# Patient Record
Sex: Female | Born: 1945
Health system: Southern US, Community
[De-identification: ages and names within clinical notes are randomized; demographics above are authoritative.]

## PROBLEM LIST (undated history)

## (undated) DIAGNOSIS — E669 Obesity, unspecified: Secondary | ICD-10-CM

## (undated) DIAGNOSIS — F32A Depression, unspecified: Secondary | ICD-10-CM

## (undated) DIAGNOSIS — R0609 Other forms of dyspnea: Principal | ICD-10-CM

## (undated) DIAGNOSIS — G20 Parkinson's disease: Secondary | ICD-10-CM

## (undated) DIAGNOSIS — G20C Parkinsonism, unspecified: Secondary | ICD-10-CM

## (undated) DIAGNOSIS — L509 Urticaria, unspecified: Secondary | ICD-10-CM

## (undated) DIAGNOSIS — F319 Bipolar disorder, unspecified: Secondary | ICD-10-CM

## (undated) DIAGNOSIS — F419 Anxiety disorder, unspecified: Secondary | ICD-10-CM

## (undated) DIAGNOSIS — F329 Major depressive disorder, single episode, unspecified: Secondary | ICD-10-CM

## (undated) DIAGNOSIS — E039 Hypothyroidism, unspecified: Secondary | ICD-10-CM

## (undated) HISTORY — DX: Parkinsonism, unspecified: G20.C

## (undated) HISTORY — DX: Anxiety disorder, unspecified: F41.9

## (undated) HISTORY — PX: ABDOMINAL HYSTERECTOMY: SHX81

## (undated) HISTORY — DX: Other forms of dyspnea: R06.09

## (undated) HISTORY — PX: KNEE SURGERY: SHX244

## (undated) HISTORY — DX: Urticaria, unspecified: L50.9

## (undated) HISTORY — DX: Parkinson's disease: G20

## (undated) HISTORY — PX: HUMERUS FRACTURE SURGERY: SHX670

## (undated) HISTORY — DX: Bipolar disorder, unspecified: F31.9

## (undated) HISTORY — DX: Hypothyroidism, unspecified: E03.9

## (undated) HISTORY — DX: Major depressive disorder, single episode, unspecified: F32.9

## (undated) HISTORY — PX: BREAST ENHANCEMENT SURGERY: SHX7

## (undated) HISTORY — PX: FOOT FRACTURE SURGERY: SHX645

## (undated) HISTORY — DX: Obesity, unspecified: E66.9

## (undated) HISTORY — DX: Depression, unspecified: F32.A

---

## 2003-08-14 HISTORY — PX: COLONOSCOPY: SHX174

## 2004-02-18 ENCOUNTER — Ambulatory Visit (HOSPITAL_COMMUNITY): Admission: RE | Admit: 2004-02-18 | Discharge: 2004-02-18 | Payer: Self-pay | Admitting: Family Medicine

## 2004-02-18 ENCOUNTER — Ambulatory Visit (HOSPITAL_COMMUNITY): Admission: RE | Admit: 2004-02-18 | Discharge: 2004-02-18 | Payer: Self-pay | Admitting: Internal Medicine

## 2004-02-23 ENCOUNTER — Encounter: Payer: Self-pay | Admitting: Internal Medicine

## 2004-02-23 ENCOUNTER — Ambulatory Visit (HOSPITAL_COMMUNITY): Admission: RE | Admit: 2004-02-23 | Discharge: 2004-02-23 | Payer: Self-pay | Admitting: Internal Medicine

## 2004-02-23 DIAGNOSIS — R197 Diarrhea, unspecified: Secondary | ICD-10-CM

## 2006-02-12 ENCOUNTER — Ambulatory Visit: Payer: Self-pay | Admitting: Cardiology

## 2009-06-15 ENCOUNTER — Ambulatory Visit: Payer: Self-pay | Admitting: Cardiology

## 2010-02-10 DIAGNOSIS — R0600 Dyspnea, unspecified: Secondary | ICD-10-CM

## 2010-02-10 DIAGNOSIS — R0609 Other forms of dyspnea: Secondary | ICD-10-CM

## 2010-02-10 DIAGNOSIS — R0602 Shortness of breath: Secondary | ICD-10-CM | POA: Insufficient documentation

## 2010-02-10 HISTORY — DX: Dyspnea, unspecified: R06.00

## 2010-02-10 HISTORY — DX: Other forms of dyspnea: R06.09

## 2010-04-15 ENCOUNTER — Encounter: Payer: Self-pay | Admitting: Cardiovascular Disease

## 2010-05-29 ENCOUNTER — Ambulatory Visit: Payer: Self-pay | Admitting: Gastroenterology

## 2010-05-29 ENCOUNTER — Encounter: Payer: Self-pay | Admitting: Internal Medicine

## 2010-05-29 DIAGNOSIS — R197 Diarrhea, unspecified: Secondary | ICD-10-CM | POA: Insufficient documentation

## 2010-05-30 ENCOUNTER — Encounter: Payer: Self-pay | Admitting: Internal Medicine

## 2010-05-30 ENCOUNTER — Encounter: Payer: Self-pay | Admitting: Gastroenterology

## 2010-06-01 LAB — CONVERTED CEMR LAB
Basophils Relative: 0 % (ref 0–1)
Eosinophils Absolute: 0.2 10*3/uL (ref 0.0–0.7)
Eosinophils Relative: 4 % (ref 0–5)
HCT: 36.2 % (ref 36.0–46.0)
MCV: 92.3 fL (ref 78.0–100.0)
Monocytes Absolute: 0.6 10*3/uL (ref 0.1–1.0)
Neutro Abs: 2.4 10*3/uL (ref 1.7–7.7)
Neutrophils Relative %: 40 % — ABNORMAL LOW (ref 43–77)
Platelets: 194 10*3/uL (ref 150–400)
RBC: 3.92 M/uL (ref 3.87–5.11)
RDW: 13.6 % (ref 11.5–15.5)

## 2010-06-09 ENCOUNTER — Telehealth (INDEPENDENT_AMBULATORY_CARE_PROVIDER_SITE_OTHER): Payer: Self-pay

## 2010-09-12 NOTE — Letter (Signed)
Summary: FLEX SIG ORDER  FLEX SIG ORDER   Imported By: Ave Filter 05/29/2010 12:07:54  _____________________________________________________________________  External Attachment:    Type:   Image     Comment:   External Document

## 2010-09-12 NOTE — Letter (Signed)
Summary: REFERRAL FROM DR Neita Carp  REFERRAL FROM DR Neita Carp   Imported By: Rexene Alberts 05/30/2010 10:14:38  _____________________________________________________________________  External Attachment:    Type:   Image     Comment:   External Document
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
## 2010-09-12 NOTE — Progress Notes (Signed)
  Phone Note Call from Patient Call back at Home Phone [phone]   Caller: Patient Summary of Call: pt called- she went to pick up entocort rx and it was [phone]. pt thinks her insurance will pay for it if she gets a 90 day supply even if she doesnt take all of it. pt requesting 90 day supply [facility]  Initial call taken by: Hendricks Limes LPN,  June 09, 2010 12:11 PM     Appended Document:  OK to call in Entocort generic 3mg , take three by mouth daily. #270, 0refills  Appended Document:  [facility]

## 2010-09-22 ENCOUNTER — Encounter: Payer: Self-pay | Admitting: Cardiovascular Disease

## 2010-09-25 ENCOUNTER — Encounter: Payer: Self-pay | Admitting: Cardiovascular Disease

## 2010-10-03 ENCOUNTER — Institutional Professional Consult (permissible substitution) (INDEPENDENT_AMBULATORY_CARE_PROVIDER_SITE_OTHER): Payer: PRIVATE HEALTH INSURANCE | Admitting: Cardiovascular Disease

## 2010-10-03 ENCOUNTER — Encounter: Payer: Self-pay | Admitting: Cardiovascular Disease

## 2010-10-03 DIAGNOSIS — R0602 Shortness of breath: Secondary | ICD-10-CM

## 2010-10-04 ENCOUNTER — Ambulatory Visit (HOSPITAL_COMMUNITY): Payer: PRIVATE HEALTH INSURANCE | Attending: Cardiovascular Disease

## 2010-10-04 DIAGNOSIS — I079 Rheumatic tricuspid valve disease, unspecified: Secondary | ICD-10-CM | POA: Insufficient documentation

## 2010-10-04 DIAGNOSIS — I059 Rheumatic mitral valve disease, unspecified: Secondary | ICD-10-CM | POA: Insufficient documentation

## 2010-10-04 DIAGNOSIS — R0609 Other forms of dyspnea: Secondary | ICD-10-CM | POA: Insufficient documentation

## 2010-10-04 DIAGNOSIS — M7989 Other specified soft tissue disorders: Secondary | ICD-10-CM | POA: Insufficient documentation

## 2010-10-04 DIAGNOSIS — R0989 Other specified symptoms and signs involving the circulatory and respiratory systems: Secondary | ICD-10-CM | POA: Insufficient documentation
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
## 2010-10-12 ENCOUNTER — Ambulatory Visit (INDEPENDENT_AMBULATORY_CARE_PROVIDER_SITE_OTHER): Payer: PRIVATE HEALTH INSURANCE | Admitting: Cardiovascular Disease

## 2010-10-12 ENCOUNTER — Encounter: Payer: Self-pay | Admitting: Cardiovascular Disease

## 2010-10-12 DIAGNOSIS — R0602 Shortness of breath: Secondary | ICD-10-CM

## 2010-10-18 ENCOUNTER — Encounter: Payer: Self-pay | Admitting: Internal Medicine

## 2010-10-18 ENCOUNTER — Institutional Professional Consult (permissible substitution) (INDEPENDENT_AMBULATORY_CARE_PROVIDER_SITE_OTHER): Payer: PRIVATE HEALTH INSURANCE | Admitting: Internal Medicine

## 2010-10-18 DIAGNOSIS — R0602 Shortness of breath: Secondary | ICD-10-CM

## 2010-10-19 NOTE — Letter (Signed)
Summary: Psychiatric Progress Note  Psychiatric Progress Note   Imported By: Marylou Mccoy 10/12/2010 14:23:35  _____________________________________________________________________  External Attachment:    Type:   Image     Comment:   External Document

## 2010-10-19 NOTE — Letter (Signed)
Summary: Psychiatric Progress Note  Psychiatric Progress Note   Imported By: Marylou Mccoy 10/12/2010 14:22:18  _____________________________________________________________________  External Attachment:    Type:   Image     Comment:   External Document

## 2010-10-19 NOTE — Assessment & Plan Note (Signed)
Summary: rov/wpa   Visit Type:  follow-up / echo Referring Provider:  Fara Chute Primary Provider:  Fara Chute  CC:  leg pain.  History of Present Illness: 65 yo WF with  history of hypothyroidism, bipolar disorder who is here today for follow up. I saw her one week ago for evaluation of dyspnea. She tells me that she has had dyspnea with exertion for the last 6 months. No cough, fever, chills. She was admitted to [facility] 2 years ago with SOB and edema and was diuresed. She denied any chest pain.  She had a normal cardiac cath 20 years ago in [place]. Normal stress test in December 2010 at [facility] in [place]. At the first visit I ordered an echo. This showed normal LV size and function with EF of 60-65%. There was moderate LVH. No significant valvular issues.   She tells me that she continues to have dyspnea with minimal exertion. She has no chest pain. Her legs are weak when she first stands. She has no known vascular disease. She has had prior left knee surgery.   Current Medications (verified): 1)  Dicyclomine Hcl 10 Mg Caps (Dicyclomine Hcl) .... Q 6 Hours As Needed 2)  Levothroid 150 Mcg Tabs (Levothyroxine Sodium) .... Once Daily 3)  Divalproex Sodium 500 Mg Tbec (Divalproex Sodium) .... 4 At Bedtime 4)  Lorazepam 0.5 Mg Tabs (Lorazepam) .... Take 1 Tablet By Mouth Once A Day 5)  Sertraline Hcl 50 Mg Tabs (Sertraline Hcl) .... Once Daily 6)  Tylenol Pm Extra Strength 500-25 Mg Tabs (Diphenhydramine-Apap (Sleep)) .... At Bedtime 7)  Budesonide 3 Mg Xr24h-Cap (Budesonide) .... 3 By Mouth Every Morning  Allergies (verified): No Known Drug Allergies  Past History:  Past Medical History: Reviewed history from 10/03/2010 and no changes required. Hypothyroidism Bipolar D/O  TCS, Dr. Gabriel Cirri 12/10, unremarkable. No biopsies done. TCS/TI, Dr. Jena Gauss, 2005. Endoscopically normal appearing colon and TI. Awaiting old records of biopsies.  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 No drug use.  Vital Signs:  Patient profile:   65 year old female Height:      66 inches Weight:      229 pounds BMI:     37.10 Pulse rate:   86 / minute BP sitting:   120 / 74  (left arm) Cuff size:   large  Vitals Entered By: Caralee Ates CMA (October 12, 2010 12:04 PM)   Impression & Recommendations:  Problem # 1:  DYSPNEA (ICD-786.05) I cannot explain her dyspnea based on my findings. She has LVH but no diastolic dysfunction. There are no significant valvular abnormalities. Her LV function is normal. She has had a normal cardiac cath remotely with normal stress myoview one year ago in [place]. There is no chest pain. BP is well controlled. Her dyspnea is likely multifactorial including obesity and deconditioning however she has had no prior pulmonary workup. She is a life-long non-smoker. I will refer her to see Dr. Marchelle Gearing at [facility] for pulmonary workup.  I will see her back in one year and repeat an echo at that time to assess her LV size and wall thickness.   Orders: Pulmonary Referral (Pulmonary)  Patient Instructions: 1)  Your physician recommends that you schedule a follow-up appointment in: 1 year 2)  Your physician recommends that you continue on your current medications as directed. Please refer to the Current Medication list given to  you today. 3)  Your physician has requested that you have an echocardiogram in 1 year.  Echocardiography is a painless test that uses sound waves to create images of your heart. It provides your doctor with information about the size and shape of your heart and how well your heart's chambers and valves are working.  This procedure takes approximately one hour. There are no restrictions for this procedure. 4)  You have been referred to see Dr. Marchelle Gearing with [facility].

## 2010-10-24 NOTE — Assessment & Plan Note (Signed)
Summary: dyspnea/dr mcalhany/mhh   Copy to:  Dr Earney Hamburg Primary Provider/Referring Provider:  Fara Chute  CC:  Pulmonary Consult for SOB with exertion. Marland Kitchen  History of Present Illness: 65 yo WF non-smoker, no passive smoking. with  history of hypothyroidism, obesity (BMI 36),  bipolar disorder. Own Presenter, broadcasting business and also works same. She has had dyspnea with exertion for the last 6 months. Insidious onset. Progressive. Currently present with minimal exertion. Activities like walking small inclines make her dyspneic or walking  1 flight of stairs.  Relieved by rest.  No associuated cough, fever, chills, ssputum, edema, orthopnea, paroxysmal nocturnal dyspnea, wheezing  .She was admitted to [facility] 2 years ago with SOB and edema and was diuresed. She denies any chest pain.  She had a normal cardiac cath 20 years ago in [place]. Normal stress test in December 2010 at [facility] in [place]. Echo 2012 with Dr. Clifton James showed normal LV size and function with EF of 60-65%. There was moderate LVH. No significant valvular issues.   Cards could not explain her dyspnea due to lack of diastolic dysfunction,,valvular abnormalities and normal LV function is normal and normal remote  cardiac cath remotely with normal stress myoview one year ago in [place]. So, referred here  Of note weight is 227#, 5 years ago was 200#.  Weighed <150#, 20 years ago. She did not desaturate when we walked her 155 feet x 3 laps. She also significant OA of knees and limps. She also needs help to get out of chair or when stooping and feels this is due to muscle weakness if lower limbs. She feels this weakness is tied to dyspnea. In terms of imaging, CT abd lung cut in 2005 is normal on review. She reports cxr 8 months ago at PMD office - firs one she states "verge of pna" and followup one was "normal"  Preventive Screening-Counseling & Management  Alcohol-Tobacco     Smoking Status:  never     Passive Smoke Exposure: no  Current Medications (verified): 1)  Dicyclomine Hcl 10 Mg Caps (Dicyclomine Hcl) .... Q 6 Hours As Needed 2)  Levothroid 150 Mcg Tabs (Levothyroxine Sodium) .... Once Daily 3)  Divalproex Sodium 500 Mg Tbec (Divalproex Sodium) .... 4 At Bedtime 4)  Lorazepam 0.5 Mg Tabs (Lorazepam) .... Take 1 Tablet By Mouth Once A Day 5)  Sertraline Hcl 50 Mg Tabs (Sertraline Hcl) .... Once Daily 6)  Tylenol Pm Extra Strength 500-25 Mg Tabs (Diphenhydramine-Apap (Sleep)) .... At Bedtime 7)  Budesonide 3 Mg Xr24h-Cap (Budesonide) .... 3 By Mouth Every Morning  Allergies (verified): No Known Drug Allergies  Social History: Smoking Status:  never Passive Smoke Exposure:  no  Review of Systems       The patient complains of shortness of breath with activity, anxiety, depression, and joint stiffness or pain.  The patient denies shortness of breath at rest, productive cough, non-productive cough, coughing up blood, chest pain, irregular heartbeats, acid heartburn, indigestion, loss of appetite, weight change, abdominal pain, difficulty swallowing, sore throat, tooth/dental problems, headaches, nasal congestion/difficulty breathing through nose, sneezing, itching, ear ache, hand/feet swelling, rash, change in color of mucus, and fever.    Vital Signs:  Patient profile:   65 year old female Height:      66 inches Weight:      226.25 pounds BMI:     36.65 O2 Sat:      99 % on Room air Temp:     98.1 degrees  F oral Pulse rate:   77 / minute BP sitting:   122 / 74  (right arm) Cuff size:   large  Vitals Entered By: Carron Curie CMA (October 18, 2010 10:49 AM)  O2 Flow:  Room air  Serial Vital Signs/Assessments:  Comments: Ambulatory Pulse Oximetry  Resting; HR__66___    02 Sat___98__  Lap1 (185 feet)   HR__97___   02 Sat__96___ Lap2 (185 feet)   HR___102__   02 Sat__96___    Lap3 (185 feet)   HR___103__   02 Sat__96___  _x__Test Completed without  Difficulty ___Test Stopped due to:   By: Carron Curie CMA   CC: Pulmonary Consult for SOB with exertion.  Comments Medications reviewed with patient Carron Curie CMA  October 18, 2010 10:51 AM Daytime phone number verified with patient.    Physical Exam  General:  well developed, well nourished, in no acute distress Head:  normocephalic and atraumatic Eyes:  PERRLA/EOM intact; conjunctiva and sclera clear Ears:  TMs intact and clear with normal canals Nose:  no deformity, discharge, inflammation, or lesions Mouth:  no deformity or lesions Neck:  no masses, thyromegaly, or abnormal cervical nodes Chest Wall:  no deformities noted Lungs:  clear bilaterally to auscultation and percussion Heart:  regular rate and rhythm, S1, S2 without murmurs, rubs, gallops, or clicks Abdomen:  bowel sounds positive; abdomen soft and non-tender without masses, or organomegaly Msk:  no deformity or scoliosis noted with normal posture Pulses:  pulses normal Extremities:  no clubbing, cyanosis, edema, or deformity noted Neurologic:  CN II-XII grossly intact with normal reflexes, coordination, muscle strength and tone Skin:  intact without lesions or rashes Cervical Nodes:  no significant adenopathy Axillary Nodes:  no significant adenopathy Psych:  alert and cooperative; normal mood and affect; normal attention span and concentration   CT Abdomen/Pelvis  Procedure date:  02/18/2004  Findings:      lung cut looks normal to my independent review  Impression & Recommendations:  Problem # 1:  SHORTNESS OF BREATH (SOB) (ICD-786.05) Assessment New Unclear cause of dyspnea. Cardiac etioogy ruled out. Possibly related to obesity or deconditioning. THe knee issue and Lower Extremity weakness is puzzling. She does not desaturate with exertion 155 feet x 3 laps. I will get full PFT. I will like to review cxr from outside. If these are normal, will consider CPST (she says she can try to do it  despite limitations on knee). Might even consider neuro referal for LE weakness Orders: Consultation Level V (16109)  Other Orders: Pulmonary Referral (Pulmonary)  Patient Instructions: 1)  please have full breathing test called PFT 2)  please bring your chest xray original hard copy films from Dr. Dian Situ office at time of your next followup 3)  return to see me after PFT

## 2010-11-07 ENCOUNTER — Encounter: Payer: Self-pay | Admitting: Internal Medicine

## 2010-11-08 ENCOUNTER — Ambulatory Visit (INDEPENDENT_AMBULATORY_CARE_PROVIDER_SITE_OTHER): Payer: PRIVATE HEALTH INSURANCE | Admitting: Internal Medicine

## 2010-11-08 ENCOUNTER — Encounter: Payer: Self-pay | Admitting: Internal Medicine

## 2010-11-08 VITALS — BP 130/70 | HR 101 | Temp 98.3°F | Ht 67.0 in | Wt 226.0 lb

## 2010-11-08 DIAGNOSIS — R0602 Shortness of breath: Secondary | ICD-10-CM

## 2010-11-08 DIAGNOSIS — E669 Obesity, unspecified: Secondary | ICD-10-CM | POA: Insufficient documentation

## 2010-11-08 DIAGNOSIS — R0609 Other forms of dyspnea: Secondary | ICD-10-CM

## 2010-11-08 DIAGNOSIS — M6281 Muscle weakness (generalized): Secondary | ICD-10-CM

## 2010-11-08 LAB — PULMONARY FUNCTION TEST

## 2010-11-08 NOTE — Assessment & Plan Note (Signed)
Normal stress test dec 2010. Reportedl normal CXR 2011. CT abdomen lung cut 2005  - normal. Normal PFT - march 2012  PLAN CPST bike test If this is negative, will get metahcoline challenge for asthma

## 2010-11-08 NOTE — Assessment & Plan Note (Signed)
Unclear if this is due to DJD and obesity or neuromuscular problem. I cannot detect any muscle weakness on exam. We discussed neuro referral but she wants to hold off till cpst complete

## 2010-11-08 NOTE — Progress Notes (Signed)
PFT done today. 

## 2010-11-08 NOTE — Progress Notes (Signed)
  Subjective:    Patient ID: Annette Johnston, female    DOB: September 07, 1945, 65 y.o.   MRN: [id number]  Shortness of Breath This is a chronic problem. Episode onset: since july 2011. The problem occurs constantly. The problem has been gradually worsening. Duration: with exertion. Pertinent negatives include no abdominal pain, chest pain, claudication, coryza, ear pain, fever, headaches, hemoptysis, leg pain, leg swelling, neck pain, orthopnea, PND, rash, rhinorrhea, sore throat, sputum production, swollen glands, syncope, vomiting or wheezing. The symptoms are aggravated by exercise. Associated symptoms comments: Proximal muscles feel weak and reports issues like husband having to lift her out of chair or unable to climb onto tractor. Risk factors: obesity. She has tried nothing for the symptoms. The treatment provided no relief. There is no history of allergies, aspirin allergies, asthma, bronchiolitis, CAD, chronic lung disease, COPD, DVT, a heart failure, PE, pneumonia or a recent surgery. (Obesity, djd and proximal muscle weakness)  Today is followup with PFTs - PFTs are normal. She could not bring her outside cxr due to issues with computer system at [facility] office. She promises she wil bring it in next time    Review of Systems  Constitutional: Negative for fever.  HENT: Negative for ear pain, sore throat, rhinorrhea and neck pain.   Respiratory: Positive for shortness of breath. Negative for hemoptysis, sputum production and wheezing.   Cardiovascular: Negative for chest pain, orthopnea, claudication, leg swelling, syncope and PND.  Gastrointestinal: Negative for vomiting and abdominal pain.  Musculoskeletal: Positive for gait problem.       [C/o proximal muscle weakness Skin: Negative for rash.  Neurological: Negative for headaches.       Objective:   Physical Exam  [vitalsreviewed. Constitutional: She is oriented to person, place, and time. She appears well-developed and well-nourished. No  distress.       obese  HENT:  Head: Normocephalic and atraumatic.  Right Ear: External ear normal.  Left Ear: External ear normal.  Mouth/Throat: Oropharynx is clear and moist. No oropharyngeal exudate.  Eyes: Conjunctivae and EOM are normal. Pupils are equal, round, and reactive to light. Right eye exhibits no discharge. Left eye exhibits no discharge. No scleral icterus.  Neck: Normal range of motion. Neck supple. No JVD present. No tracheal deviation present. No thyromegaly present.  Cardiovascular: Normal rate, regular rhythm, normal heart sounds and intact distal pulses.  Exam reveals no gallop and no friction rub.   No murmur heard. Pulmonary/Chest: Effort normal and breath sounds normal. No respiratory distress. She has no wheezes. She has no rales. She exhibits no tenderness.  Abdominal: Soft. Bowel sounds are normal. She exhibits no distension and no mass. There is no tenderness. There is no rebound and no guarding.  Musculoskeletal: Normal range of motion. She exhibits no edema and no tenderness.  Lymphadenopathy:    She has no cervical adenopathy.  Neurological: She is alert and oriented to person, place, and time. She has normal reflexes. No cranial nerve deficit. She exhibits normal muscle tone. Coordination normal.       i cannot determine any proximal muscle weakness  Skin: Skin is warm and dry. No rash noted. She is not diaphoretic. No erythema. No pallor.  Psychiatric: She has a normal mood and affect. Her behavior is normal. Judgment and thought content normal.          Assessment & Plan:

## 2010-11-08 NOTE — Patient Instructions (Signed)
Please have bike CPST test Return to see me after that test

## 2010-11-15 ENCOUNTER — Ambulatory Visit (HOSPITAL_COMMUNITY): Payer: PRIVATE HEALTH INSURANCE | Attending: Internal Medicine

## 2010-11-15 ENCOUNTER — Encounter: Payer: Self-pay | Admitting: Internal Medicine

## 2010-11-15 DIAGNOSIS — R0989 Other specified symptoms and signs involving the circulatory and respiratory systems: Secondary | ICD-10-CM | POA: Insufficient documentation

## 2010-11-15 DIAGNOSIS — R0609 Other forms of dyspnea: Secondary | ICD-10-CM | POA: Insufficient documentation

## 2010-11-23 ENCOUNTER — Telehealth: Payer: Self-pay | Admitting: Internal Medicine

## 2010-11-23 NOTE — Telephone Encounter (Signed)
Spoke w/ pt and she had a CPST test done 11/08/10. Pt states she has not heard anything back about theses results. Pt was told to follow up after she had CPST. Pt is scheduled to see MR 12/15/10 AT 11:30. Pt aware MR is out of the office. Please advise MR. Thanks

## 2010-11-27 NOTE — Telephone Encounter (Signed)
Please let her know that i just got back today and will call back in 1-2 days

## 2010-11-27 NOTE — Telephone Encounter (Signed)
LMOMTCB to inform pt MR will call her back in 1-2 days regarding results.

## 2010-11-29 NOTE — Telephone Encounter (Signed)
Please let her know it will be early next week for me to get CPST results

## 2010-11-29 NOTE — Telephone Encounter (Signed)
Called, spoke with pt.  She is aware CPST results will be ready early next week and was understanding of this.  Will forward message back to MR as a reminder.

## 2010-12-05 ENCOUNTER — Telehealth: Payer: Self-pay | Admitting: Internal Medicine

## 2010-12-05 DIAGNOSIS — M6281 Muscle weakness (generalized): Secondary | ICD-10-CM

## 2010-12-05 NOTE — Telephone Encounter (Signed)
Please call patient and say CPST suggests breathing difficulty due to weight and/or muscle weakness. Please make referreal to neuro Dr. Dolores Lory (neurmuscular) or Dr Anne Hahn (need to find out if he specializes in neuromuscular). Please give appt to see me; I will discuss and order ssome tests for proximal muscle weakness (Dermatomyositis etc)

## 2010-12-12 NOTE — Telephone Encounter (Signed)
Pt set to see MR on 12-27-10 and order placed for neuro referral. Pt aware. Carron Curie, CMA

## 2010-12-15 ENCOUNTER — Ambulatory Visit: Payer: No Typology Code available for payment source | Admitting: Internal Medicine

## 2010-12-19 NOTE — Telephone Encounter (Signed)
See phone note 12/05/10

## 2010-12-19 NOTE — Telephone Encounter (Signed)
I thought I sent Annette Johnston  A message through epic. Either i messed up how I sent it or JC has not looked at it. Please clarify ?

## 2010-12-22 ENCOUNTER — Encounter: Payer: Self-pay | Admitting: Internal Medicine

## 2010-12-27 ENCOUNTER — Ambulatory Visit (INDEPENDENT_AMBULATORY_CARE_PROVIDER_SITE_OTHER): Payer: No Typology Code available for payment source | Admitting: Internal Medicine

## 2010-12-27 ENCOUNTER — Encounter: Payer: Self-pay | Admitting: Internal Medicine

## 2010-12-27 ENCOUNTER — Other Ambulatory Visit (INDEPENDENT_AMBULATORY_CARE_PROVIDER_SITE_OTHER): Payer: No Typology Code available for payment source

## 2010-12-27 VITALS — BP 120/78 | HR 99 | Temp 98.3°F | Ht 67.0 in | Wt 226.8 lb

## 2010-12-27 DIAGNOSIS — G709 Myoneural disorder, unspecified: Secondary | ICD-10-CM

## 2010-12-27 DIAGNOSIS — M6281 Muscle weakness (generalized): Secondary | ICD-10-CM

## 2010-12-27 DIAGNOSIS — R0989 Other specified symptoms and signs involving the circulatory and respiratory systems: Secondary | ICD-10-CM

## 2010-12-27 LAB — C-REACTIVE PROTEIN: CRP: 0.4 mg/dL (ref <0.6)

## 2010-12-27 LAB — SEDIMENTATION RATE: Sed Rate: 26 mm/hr — ABNORMAL HIGH (ref 0–22)

## 2010-12-27 LAB — CK TOTAL AND CKMB (NOT AT ARMC): CK, MB: 1.6 ng/mL (ref 0.3–4.0)

## 2010-12-27 NOTE — Progress Notes (Signed)
Subjective:    Patient ID: Annette Johnston, female    DOB: 1945-09-01, 65 y.o.   MRN: [id number]  HPI This is a chronic problem. Episode onset: since july 2011. The problem occurs constantly. The problem has been gradually worsening. Duration: with exertion. Pertinent negatives include no abdominal pain, chest pain, claudication, coryza, ear pain, fever, headaches, hemoptysis, leg pain, leg swelling, neck pain, orthopnea, PND, rash, rhinorrhea, sore throat, sputum production, swollen glands, syncope, vomiting or wheezing. The symptoms are aggravated by exercise. Associated symptoms comments: Proximal muscles feel weak and reports issues like husband having to lift her out of chair or unable to climb onto tractor. Risk factors: obesity. She has tried nothing for the symptoms. The treatment provided no relief. There is no history of allergies, aspirin allergies, asthma, bronchiolitis, CAD, chronic lung disease, COPD, DVT, a heart failure, PE, pneumonia or a recent surgery. (Obesity, djd and proximal muscle weakness)   OV 11/08/2010: Today is followup with PFTs - PFTs are normal. She could not bring her outside cxr due to issues with computer system at [facility] office. She promises she wil bring it in next time .  REC: CPST  OV May 2012: Followup dyspnea after CPST. No change in symptoms. Still complaining of difficulty rising; she think it is due to knees but unsure. Had CPST 11/15/2010: RER 1.01 somewhat submaximal effort. Vo2 max is low at 20.4/75.6%, AT normal 51%, Peark RR 67 and reached ventilatory limits. MVV low. Ve/VCO2 slope elevated suggesting dead space ventilation. Questin neuromuscular problems. Questioned her about thyroid - states she used to have graves disease and was operated on for proptosis by Dr. Demetrius Charity at [facility]. Per report recent TSH, FT4 normal     Review of Systems  Constitutional: Negative for fever and unexpected weight change.  HENT: Negative for ear pain, nosebleeds, congestion, sore  throat, rhinorrhea, sneezing, trouble swallowing, dental problem, postnasal drip and sinus pressure.   Eyes: Negative for redness and itching.  Respiratory: Negative for cough, chest tightness, shortness of breath and wheezing.   Cardiovascular: Negative for palpitations and leg swelling.  Gastrointestinal: Negative for nausea and vomiting.  Genitourinary: Negative for dysuria.  Musculoskeletal: Negative for joint swelling.  Skin: Negative for rash.  Neurological: Negative for headaches.  Hematological: Does not bruise/bleed easily.  Psychiatric/Behavioral: Negative for dysphoric mood. The patient is not nervous/anxious.        Objective:   Physical Exam Objective:   Physical Exam  [vitalsreviewed. Constitutional: She is oriented to person, place, and time. She appears well-developed and well-nourished. No distress.       obese  HENT:  Head: Normocephalic and atraumatic.  Right Ear: External ear normal.  Left Ear: External ear normal.  Mouth/Throat: Oropharynx is clear and moist. No oropharyngeal exudate.  Eyes: Conjunctivae and EOM are normal. Pupils are equal, round, and reactive to light. Right eye exhibits no discharge. Left eye exhibits no discharge. No scleral icterus.  Neck: Normal range of motion. Neck supple. No JVD present. No tracheal deviation present. No thyromegaly present.  Cardiovascular: Normal rate, regular rhythm, normal heart sounds and intact distal pulses.  Exam reveals no gallop and no friction rub.   No murmur heard. Pulmonary/Chest: Effort normal and breath sounds normal. No respiratory distress. She has no wheezes. She has no rales. She exhibits no tenderness.  Abdominal: Soft. Bowel sounds are normal. She exhibits no distension and no mass. There is no tenderness. There is no rebound and no guarding.  Musculoskeletal: Normal range of motion.  She exhibits no edema and no tenderness.  Lymphadenopathy:    She has no cervical adenopathy.  Neurological: She is  alert and oriented to person, place, and time. She has normal reflexes. No cranial nerve deficit. She exhibits normal muscle tone. Coordination normal.       i cannot determine any proximal muscle weakness  Skin: Skin is warm and dry. No rash noted. She is not diaphoretic. No erythema. No pallor.  Psychiatric: She has a normal mood and affect. Her behavior is normal. Judgment and thought content norm        Assessment & Plan:

## 2010-12-27 NOTE — Patient Instructions (Signed)
Please have blood test that I have already ordered Please see neurologist - we will send them the note Please return to see me in 1 month after you see neurologist
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
## 2010-12-29 NOTE — Op Note (Signed)
NAME:  Annette Johnston, Annette Johnston                          ACCOUNT NO.:  [id number]   MEDICAL RECORD NO.:  [id number]                   PATIENT TYPE:  AMB   LOCATION:  DAY                                  FACILITY:  [facility]   PHYSICIAN:  R. Roetta Sessions, M.D.              DATE OF BIRTH:  [birth year]   DATE OF PROCEDURE:  02/23/2004  DATE OF DISCHARGE:                                  PROCEDURE NOTE   PROCEDURE:  Colonoscopy with ileoscopy, segmental biopsy, stool sampling.   ENDOSCOPIST:  Jonathon Bellows, M.D.   INDICATION FOR PROCEDURE:  The patient is a 65 year old lady who has had  basically diarrhea since October 2004.  Stool studies through our office  came back negative.  She was shown to have some mild hepatomegaly.  CT  demonstrated 2 very small cystic lesions in the liver that were felt not to  be clinically significant and a couple of small left iliac lymph nodes.  CBC, MET-7 and LFTs came back normal as well.  Colonoscopy is now being  done.  This approach has been discussed with the patient at length and  potential risks, benefits and alternatives have been reviewed and questions  answered; she is agreeable.   PROCEDURE NOTE:  O2 saturation, blood pressure, pulse and respirations were  monitored throughout the entirety of the procedure.   CONSCIOUS SEDATION:  Versed 3 mg IV, Demerol 75 mg IV in divided doses.   INSTRUMENT:  Olympus video chip system.   FINDINGS:  Digital rectal examination revealed no abnormalities.   ENDOSCOPIC FINDINGS:  The prep was good.   RECTUM:  Examination of the rectal mucosa including a retroflexed view of  the anal verge revealed no abnormalities.   COLON:  Colonic mucosa was surveyed from the rectosigmoid junction through  the left, transverse and right colon to the area of the appendiceal orifice,  ileocecal valve and cecum.  These structures were well-seen and photographed  for the record.  From this level, the scope was slowly withdrawn and all  previously mentioned mucosal surfaces were again seen.  The colonic mucosa  in the terminal ileum appeared normal.  Biopsies of the right and sigmoid  colon were taken for histologic study, also a stool sample was taken.   The patient tolerated the procedure well and was reacted at endoscopy.   IMPRESSION:  Normal rectum and colon to terminal ileum, status post  segmental biopsies and stool collection.   RECOMMENDATIONS:  Follow up on repeat stool studies and biopsies, further  recommendations to follow.      ___________________________________________                                            Jonathon Bellows, M.D.   RMR/MEDQ  D:  02/23/2004  T:  02/23/2004  Job:  [id number]   cc:   Fara Chute  [address] [place]  [place] [address]  Fax: [phone]

## 2011-01-06 ENCOUNTER — Encounter: Payer: Self-pay | Admitting: Internal Medicine

## 2011-01-06 NOTE — Assessment & Plan Note (Signed)
Unclear if this is due to DJD and obesity or neuromuscular problem. I cannot detect any muscle weakness on exam. Will check autoimmune profle and refer to neuro for opinion

## 2011-01-19 ENCOUNTER — Telehealth: Payer: Self-pay | Admitting: Internal Medicine

## 2011-01-19 NOTE — Telephone Encounter (Signed)
[id number] to advise pt of lab results. Carron Curie, CMA

## 2011-01-19 NOTE — Telephone Encounter (Signed)
Pt returning call.Annette Johnston ° °

## 2011-01-19 NOTE — Telephone Encounter (Signed)
Spoke with pt and notified of results per MR. Pt verbalized understanding.

## 2011-01-19 NOTE — Telephone Encounter (Signed)
LMOMTCB need to inform pt per MR that autoimmune results normal so far.

## 2011-02-19 ENCOUNTER — Other Ambulatory Visit: Payer: Self-pay | Admitting: Neurology

## 2011-02-19 DIAGNOSIS — R269 Unspecified abnormalities of gait and mobility: Secondary | ICD-10-CM

## 2011-02-19 DIAGNOSIS — R5383 Other fatigue: Secondary | ICD-10-CM

## 2011-02-21 ENCOUNTER — Ambulatory Visit
Admission: RE | Admit: 2011-02-21 | Discharge: 2011-02-21 | Disposition: A | Discharge status: Home / Self Care | Payer: No Typology Code available for payment source | Source: Ambulatory Visit | Attending: Neurology | Admitting: Neurology

## 2011-02-21 DIAGNOSIS — R269 Unspecified abnormalities of gait and mobility: Secondary | ICD-10-CM

## 2011-02-21 DIAGNOSIS — R5381 Other malaise: Secondary | ICD-10-CM

## 2011-08-24 DIAGNOSIS — F3173 Bipolar disorder, in partial remission, most recent episode manic: Secondary | ICD-10-CM | POA: Diagnosis not present

## 2011-09-06 DIAGNOSIS — H02539 Eyelid retraction unspecified eye, unspecified lid: Secondary | ICD-10-CM | POA: Diagnosis not present

## 2011-09-06 DIAGNOSIS — E0500 Thyrotoxicosis with diffuse goiter without thyrotoxic crisis or storm: Secondary | ICD-10-CM | POA: Diagnosis not present

## 2011-09-06 DIAGNOSIS — H16219 Exposure keratoconjunctivitis, unspecified eye: Secondary | ICD-10-CM | POA: Diagnosis not present

## 2011-10-15 DIAGNOSIS — L03211 Cellulitis of face: Secondary | ICD-10-CM | POA: Diagnosis not present

## 2011-10-18 DIAGNOSIS — L0201 Cutaneous abscess of face: Secondary | ICD-10-CM | POA: Diagnosis not present

## 2011-10-18 DIAGNOSIS — L03211 Cellulitis of face: Secondary | ICD-10-CM | POA: Diagnosis not present

## 2011-10-18 DIAGNOSIS — H10029 Other mucopurulent conjunctivitis, unspecified eye: Secondary | ICD-10-CM | POA: Diagnosis not present

## 2011-11-08 DIAGNOSIS — H81399 Other peripheral vertigo, unspecified ear: Secondary | ICD-10-CM | POA: Diagnosis not present

## 2011-12-05 DIAGNOSIS — S335XXA Sprain of ligaments of lumbar spine, initial encounter: Secondary | ICD-10-CM | POA: Diagnosis not present

## 2011-12-05 DIAGNOSIS — M999 Biomechanical lesion, unspecified: Secondary | ICD-10-CM | POA: Diagnosis not present

## 2011-12-05 DIAGNOSIS — M5430 Sciatica, unspecified side: Secondary | ICD-10-CM | POA: Diagnosis not present

## 2011-12-06 DIAGNOSIS — S335XXA Sprain of ligaments of lumbar spine, initial encounter: Secondary | ICD-10-CM | POA: Diagnosis not present

## 2011-12-06 DIAGNOSIS — M999 Biomechanical lesion, unspecified: Secondary | ICD-10-CM | POA: Diagnosis not present

## 2011-12-06 DIAGNOSIS — M5430 Sciatica, unspecified side: Secondary | ICD-10-CM | POA: Diagnosis not present

## 2011-12-10 DIAGNOSIS — M5430 Sciatica, unspecified side: Secondary | ICD-10-CM | POA: Diagnosis not present

## 2011-12-10 DIAGNOSIS — M999 Biomechanical lesion, unspecified: Secondary | ICD-10-CM | POA: Diagnosis not present

## 2011-12-10 DIAGNOSIS — S335XXA Sprain of ligaments of lumbar spine, initial encounter: Secondary | ICD-10-CM | POA: Diagnosis not present

## 2012-01-11 DIAGNOSIS — J209 Acute bronchitis, unspecified: Secondary | ICD-10-CM | POA: Diagnosis not present

## 2012-01-11 DIAGNOSIS — E039 Hypothyroidism, unspecified: Secondary | ICD-10-CM | POA: Diagnosis not present

## 2012-01-31 DIAGNOSIS — F3173 Bipolar disorder, in partial remission, most recent episode manic: Secondary | ICD-10-CM | POA: Diagnosis not present

## 2012-02-28 DIAGNOSIS — T678XXA Other effects of heat and light, initial encounter: Secondary | ICD-10-CM | POA: Diagnosis not present

## 2012-02-28 DIAGNOSIS — R0609 Other forms of dyspnea: Secondary | ICD-10-CM | POA: Diagnosis not present

## 2012-02-28 DIAGNOSIS — R9431 Abnormal electrocardiogram [ECG] [EKG]: Secondary | ICD-10-CM | POA: Diagnosis not present

## 2012-02-28 DIAGNOSIS — T675XXA Heat exhaustion, unspecified, initial encounter: Secondary | ICD-10-CM | POA: Diagnosis not present

## 2012-02-28 DIAGNOSIS — X30XXXA Exposure to excessive natural heat, initial encounter: Secondary | ICD-10-CM | POA: Diagnosis not present

## 2012-04-30 DIAGNOSIS — Z79899 Other long term (current) drug therapy: Secondary | ICD-10-CM | POA: Diagnosis not present

## 2012-04-30 DIAGNOSIS — F3112 Bipolar disorder, current episode manic without psychotic features, moderate: Secondary | ICD-10-CM | POA: Diagnosis not present

## 2012-05-06 DIAGNOSIS — E7800 Pure hypercholesterolemia, unspecified: Secondary | ICD-10-CM | POA: Diagnosis not present

## 2012-05-06 DIAGNOSIS — N951 Menopausal and female climacteric states: Secondary | ICD-10-CM | POA: Diagnosis not present

## 2012-05-06 DIAGNOSIS — E559 Vitamin D deficiency, unspecified: Secondary | ICD-10-CM | POA: Diagnosis not present

## 2012-05-06 DIAGNOSIS — E0590 Thyrotoxicosis, unspecified without thyrotoxic crisis or storm: Secondary | ICD-10-CM | POA: Diagnosis not present

## 2012-05-06 DIAGNOSIS — E2749 Other adrenocortical insufficiency: Secondary | ICD-10-CM | POA: Diagnosis not present

## 2012-05-06 DIAGNOSIS — F3173 Bipolar disorder, in partial remission, most recent episode manic: Secondary | ICD-10-CM | POA: Diagnosis not present

## 2012-05-06 DIAGNOSIS — E509 Vitamin A deficiency, unspecified: Secondary | ICD-10-CM | POA: Diagnosis not present

## 2012-05-06 DIAGNOSIS — F319 Bipolar disorder, unspecified: Secondary | ICD-10-CM | POA: Diagnosis not present

## 2012-05-06 DIAGNOSIS — D518 Other vitamin B12 deficiency anemias: Secondary | ICD-10-CM | POA: Diagnosis not present

## 2012-05-22 DIAGNOSIS — J3089 Other allergic rhinitis: Secondary | ICD-10-CM | POA: Diagnosis not present

## 2012-05-22 DIAGNOSIS — H93299 Other abnormal auditory perceptions, unspecified ear: Secondary | ICD-10-CM | POA: Diagnosis not present

## 2012-05-22 DIAGNOSIS — T169XXA Foreign body in ear, unspecified ear, initial encounter: Secondary | ICD-10-CM | POA: Diagnosis not present

## 2012-05-22 DIAGNOSIS — H6120 Impacted cerumen, unspecified ear: Secondary | ICD-10-CM | POA: Diagnosis not present

## 2012-07-01 DIAGNOSIS — F3173 Bipolar disorder, in partial remission, most recent episode manic: Secondary | ICD-10-CM | POA: Diagnosis not present

## 2012-08-01 DIAGNOSIS — F3173 Bipolar disorder, in partial remission, most recent episode manic: Secondary | ICD-10-CM | POA: Diagnosis not present

## 2012-08-12 DIAGNOSIS — J018 Other acute sinusitis: Secondary | ICD-10-CM | POA: Diagnosis not present

## 2012-08-12 DIAGNOSIS — N760 Acute vaginitis: Secondary | ICD-10-CM | POA: Diagnosis not present

## 2012-08-27 DIAGNOSIS — H903 Sensorineural hearing loss, bilateral: Secondary | ICD-10-CM | POA: Diagnosis not present

## 2012-08-27 DIAGNOSIS — J301 Allergic rhinitis due to pollen: Secondary | ICD-10-CM | POA: Diagnosis not present

## 2012-09-02 DIAGNOSIS — T8549XA Other mechanical complication of breast prosthesis and implant, initial encounter: Secondary | ICD-10-CM | POA: Diagnosis not present

## 2012-09-02 DIAGNOSIS — N63 Unspecified lump in unspecified breast: Secondary | ICD-10-CM | POA: Diagnosis not present

## 2012-09-02 DIAGNOSIS — N6459 Other signs and symptoms in breast: Secondary | ICD-10-CM | POA: Diagnosis not present

## 2012-09-02 DIAGNOSIS — R928 Other abnormal and inconclusive findings on diagnostic imaging of breast: Secondary | ICD-10-CM | POA: Diagnosis not present

## 2012-09-17 DIAGNOSIS — T8549XA Other mechanical complication of breast prosthesis and implant, initial encounter: Secondary | ICD-10-CM | POA: Diagnosis not present

## 2012-10-15 DIAGNOSIS — Z01818 Encounter for other preprocedural examination: Secondary | ICD-10-CM | POA: Diagnosis not present

## 2012-10-24 DIAGNOSIS — F3173 Bipolar disorder, in partial remission, most recent episode manic: Secondary | ICD-10-CM | POA: Diagnosis not present

## 2012-10-29 DIAGNOSIS — F3112 Bipolar disorder, current episode manic without psychotic features, moderate: Secondary | ICD-10-CM | POA: Diagnosis not present

## 2012-10-29 DIAGNOSIS — E039 Hypothyroidism, unspecified: Secondary | ICD-10-CM | POA: Diagnosis not present

## 2012-11-03 DIAGNOSIS — R9431 Abnormal electrocardiogram [ECG] [EKG]: Secondary | ICD-10-CM | POA: Diagnosis not present

## 2012-11-03 DIAGNOSIS — R110 Nausea: Secondary | ICD-10-CM | POA: Diagnosis not present

## 2012-12-01 DIAGNOSIS — K5900 Constipation, unspecified: Secondary | ICD-10-CM | POA: Diagnosis not present

## 2012-12-01 DIAGNOSIS — Z79899 Other long term (current) drug therapy: Secondary | ICD-10-CM | POA: Diagnosis not present

## 2012-12-01 DIAGNOSIS — IMO0002 Reserved for concepts with insufficient information to code with codable children: Secondary | ICD-10-CM | POA: Diagnosis not present

## 2012-12-01 DIAGNOSIS — R059 Cough, unspecified: Secondary | ICD-10-CM | POA: Diagnosis not present

## 2012-12-01 DIAGNOSIS — F319 Bipolar disorder, unspecified: Secondary | ICD-10-CM | POA: Diagnosis not present

## 2012-12-01 DIAGNOSIS — L723 Sebaceous cyst: Secondary | ICD-10-CM | POA: Diagnosis not present

## 2012-12-01 DIAGNOSIS — R339 Retention of urine, unspecified: Secondary | ICD-10-CM | POA: Diagnosis not present

## 2012-12-01 DIAGNOSIS — J9819 Other pulmonary collapse: Secondary | ICD-10-CM | POA: Diagnosis not present

## 2012-12-01 DIAGNOSIS — R062 Wheezing: Secondary | ICD-10-CM | POA: Diagnosis not present

## 2012-12-01 DIAGNOSIS — E039 Hypothyroidism, unspecified: Secondary | ICD-10-CM | POA: Diagnosis not present

## 2012-12-01 DIAGNOSIS — D649 Anemia, unspecified: Secondary | ICD-10-CM | POA: Diagnosis not present

## 2012-12-01 DIAGNOSIS — T85898A Other specified complication of other internal prosthetic devices, implants and grafts, initial encounter: Secondary | ICD-10-CM | POA: Diagnosis not present

## 2012-12-01 DIAGNOSIS — R05 Cough: Secondary | ICD-10-CM | POA: Diagnosis not present

## 2012-12-01 DIAGNOSIS — R279 Unspecified lack of coordination: Secondary | ICD-10-CM | POA: Diagnosis not present

## 2012-12-01 DIAGNOSIS — T8549XA Other mechanical complication of breast prosthesis and implant, initial encounter: Secondary | ICD-10-CM | POA: Diagnosis not present

## 2012-12-01 DIAGNOSIS — R82998 Other abnormal findings in urine: Secondary | ICD-10-CM | POA: Diagnosis not present

## 2012-12-01 DIAGNOSIS — R509 Fever, unspecified: Secondary | ICD-10-CM | POA: Diagnosis not present

## 2012-12-01 DIAGNOSIS — M6020 Foreign body granuloma of soft tissue, not elsewhere classified, unspecified site: Secondary | ICD-10-CM | POA: Diagnosis not present

## 2012-12-01 DIAGNOSIS — R079 Chest pain, unspecified: Secondary | ICD-10-CM | POA: Diagnosis not present

## 2012-12-01 DIAGNOSIS — D62 Acute posthemorrhagic anemia: Secondary | ICD-10-CM | POA: Diagnosis not present

## 2012-12-01 DIAGNOSIS — R319 Hematuria, unspecified: Secondary | ICD-10-CM | POA: Diagnosis not present

## 2012-12-01 DIAGNOSIS — Z466 Encounter for fitting and adjustment of urinary device: Secondary | ICD-10-CM | POA: Diagnosis not present

## 2012-12-03 DIAGNOSIS — R339 Retention of urine, unspecified: Secondary | ICD-10-CM | POA: Diagnosis not present

## 2012-12-03 DIAGNOSIS — D62 Acute posthemorrhagic anemia: Secondary | ICD-10-CM | POA: Diagnosis not present

## 2012-12-03 DIAGNOSIS — Z466 Encounter for fitting and adjustment of urinary device: Secondary | ICD-10-CM | POA: Diagnosis not present

## 2012-12-03 DIAGNOSIS — R509 Fever, unspecified: Secondary | ICD-10-CM | POA: Diagnosis not present

## 2012-12-03 DIAGNOSIS — K5900 Constipation, unspecified: Secondary | ICD-10-CM | POA: Diagnosis not present

## 2012-12-03 DIAGNOSIS — IMO0002 Reserved for concepts with insufficient information to code with codable children: Secondary | ICD-10-CM | POA: Diagnosis not present

## 2012-12-03 DIAGNOSIS — R062 Wheezing: Secondary | ICD-10-CM | POA: Diagnosis not present

## 2012-12-03 DIAGNOSIS — R059 Cough, unspecified: Secondary | ICD-10-CM | POA: Diagnosis not present

## 2012-12-03 DIAGNOSIS — R05 Cough: Secondary | ICD-10-CM | POA: Diagnosis not present

## 2012-12-03 DIAGNOSIS — D649 Anemia, unspecified: Secondary | ICD-10-CM | POA: Diagnosis not present

## 2012-12-04 DIAGNOSIS — IMO0002 Reserved for concepts with insufficient information to code with codable children: Secondary | ICD-10-CM | POA: Diagnosis not present

## 2012-12-16 DIAGNOSIS — F3173 Bipolar disorder, in partial remission, most recent episode manic: Secondary | ICD-10-CM | POA: Diagnosis not present

## 2012-12-18 DIAGNOSIS — Z4889 Encounter for other specified surgical aftercare: Secondary | ICD-10-CM | POA: Diagnosis not present

## 2012-12-18 DIAGNOSIS — Z4802 Encounter for removal of sutures: Secondary | ICD-10-CM | POA: Diagnosis not present

## 2013-01-26 DIAGNOSIS — S92919A Unspecified fracture of unspecified toe(s), initial encounter for closed fracture: Secondary | ICD-10-CM | POA: Diagnosis not present

## 2013-01-27 DIAGNOSIS — Z79899 Other long term (current) drug therapy: Secondary | ICD-10-CM | POA: Diagnosis not present

## 2013-01-27 DIAGNOSIS — D500 Iron deficiency anemia secondary to blood loss (chronic): Secondary | ICD-10-CM | POA: Diagnosis not present

## 2013-01-27 DIAGNOSIS — F3173 Bipolar disorder, in partial remission, most recent episode manic: Secondary | ICD-10-CM | POA: Diagnosis not present

## 2013-01-27 DIAGNOSIS — F3132 Bipolar disorder, current episode depressed, moderate: Secondary | ICD-10-CM | POA: Diagnosis not present

## 2013-02-26 DIAGNOSIS — J039 Acute tonsillitis, unspecified: Secondary | ICD-10-CM | POA: Diagnosis not present

## 2013-02-26 DIAGNOSIS — H9209 Otalgia, unspecified ear: Secondary | ICD-10-CM | POA: Diagnosis not present

## 2013-03-06 DIAGNOSIS — F3173 Bipolar disorder, in partial remission, most recent episode manic: Secondary | ICD-10-CM | POA: Diagnosis not present

## 2013-03-13 DIAGNOSIS — J40 Bronchitis, not specified as acute or chronic: Secondary | ICD-10-CM | POA: Diagnosis not present

## 2013-03-20 DIAGNOSIS — J209 Acute bronchitis, unspecified: Secondary | ICD-10-CM | POA: Diagnosis not present

## 2013-03-27 DIAGNOSIS — F329 Major depressive disorder, single episode, unspecified: Secondary | ICD-10-CM | POA: Diagnosis not present

## 2013-03-27 DIAGNOSIS — R269 Unspecified abnormalities of gait and mobility: Secondary | ICD-10-CM | POA: Diagnosis not present

## 2013-03-27 DIAGNOSIS — G253 Myoclonus: Secondary | ICD-10-CM | POA: Diagnosis not present

## 2013-03-27 DIAGNOSIS — R259 Unspecified abnormal involuntary movements: Secondary | ICD-10-CM | POA: Diagnosis not present

## 2013-03-30 DIAGNOSIS — R279 Unspecified lack of coordination: Secondary | ICD-10-CM | POA: Diagnosis not present

## 2013-03-30 DIAGNOSIS — R259 Unspecified abnormal involuntary movements: Secondary | ICD-10-CM | POA: Diagnosis not present

## 2013-04-06 ENCOUNTER — Emergency Department (HOSPITAL_COMMUNITY)
Admission: EM | Admit: 2013-04-06 | Discharge: 2013-04-06 | Disposition: A | Discharge status: Home / Self Care | Payer: Medicare Other | Attending: Emergency Medicine | Admitting: Emergency Medicine

## 2013-04-06 ENCOUNTER — Emergency Department (HOSPITAL_COMMUNITY): Payer: Medicare Other

## 2013-04-06 ENCOUNTER — Encounter (HOSPITAL_COMMUNITY): Payer: Self-pay

## 2013-04-06 DIAGNOSIS — S46909A Unspecified injury of unspecified muscle, fascia and tendon at shoulder and upper arm level, unspecified arm, initial encounter: Secondary | ICD-10-CM | POA: Insufficient documentation

## 2013-04-06 DIAGNOSIS — S4980XA Other specified injuries of shoulder and upper arm, unspecified arm, initial encounter: Secondary | ICD-10-CM | POA: Diagnosis not present

## 2013-04-06 DIAGNOSIS — Z79899 Other long term (current) drug therapy: Secondary | ICD-10-CM | POA: Insufficient documentation

## 2013-04-06 DIAGNOSIS — Z23 Encounter for immunization: Secondary | ICD-10-CM | POA: Diagnosis not present

## 2013-04-06 DIAGNOSIS — F319 Bipolar disorder, unspecified: Secondary | ICD-10-CM | POA: Diagnosis not present

## 2013-04-06 DIAGNOSIS — S79919A Unspecified injury of unspecified hip, initial encounter: Secondary | ICD-10-CM | POA: Diagnosis not present

## 2013-04-06 DIAGNOSIS — E669 Obesity, unspecified: Secondary | ICD-10-CM | POA: Insufficient documentation

## 2013-04-06 DIAGNOSIS — M25519 Pain in unspecified shoulder: Secondary | ICD-10-CM | POA: Diagnosis not present

## 2013-04-06 DIAGNOSIS — E039 Hypothyroidism, unspecified: Secondary | ICD-10-CM | POA: Insufficient documentation

## 2013-04-06 DIAGNOSIS — W19XXXA Unspecified fall, initial encounter: Secondary | ICD-10-CM

## 2013-04-06 DIAGNOSIS — S0003XA Contusion of scalp, initial encounter: Secondary | ICD-10-CM | POA: Diagnosis not present

## 2013-04-06 DIAGNOSIS — S0083XA Contusion of other part of head, initial encounter: Secondary | ICD-10-CM

## 2013-04-06 DIAGNOSIS — Y929 Unspecified place or not applicable: Secondary | ICD-10-CM | POA: Insufficient documentation

## 2013-04-06 DIAGNOSIS — S0180XA Unspecified open wound of other part of head, initial encounter: Secondary | ICD-10-CM | POA: Insufficient documentation

## 2013-04-06 DIAGNOSIS — W010XXA Fall on same level from slipping, tripping and stumbling without subsequent striking against object, initial encounter: Secondary | ICD-10-CM | POA: Insufficient documentation

## 2013-04-06 DIAGNOSIS — Y9301 Activity, walking, marching and hiking: Secondary | ICD-10-CM | POA: Insufficient documentation

## 2013-04-06 DIAGNOSIS — M25559 Pain in unspecified hip: Secondary | ICD-10-CM | POA: Diagnosis not present

## 2013-04-06 MED ORDER — KETOROLAC TROMETHAMINE 60 MG/2ML IM SOLN
60.0000 mg | Freq: Once | INTRAMUSCULAR | Status: AC
  Administered 2013-04-06: 60 mg via INTRAMUSCULAR
  Filled 2013-04-06: qty 2

## 2013-04-06 MED ORDER — TETANUS-DIPHTH-ACELL PERTUSSIS 5-2.5-18.5 LF-MCG/0.5 IM SUSP
0.5000 mL | Freq: Once | INTRAMUSCULAR | Status: AC
  Administered 2013-04-06: 0.5 mL via INTRAMUSCULAR
  Filled 2013-04-06: qty 0.5

## 2013-04-06 NOTE — ED Notes (Signed)
Pt states she was on her way into the hospital and slipped on an uneven area of the sidewalk out front. States she fell forward on her face striking her chin and her glasses fell off. Also, complain of pain in right hip and right shoulder. Pt was place on backboard and c collar and transported to the ED
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
## 2013-04-06 NOTE — ED Notes (Signed)
Pt given discharge instructions and verbalized understanding. Vital signs were WNL. NAD.

## 2013-04-06 NOTE — ED Notes (Signed)
Cleaned 1 cm laceration on patients chin and applied a steri strip.

## 2013-04-06 NOTE — ED Notes (Signed)
Pt returned from x-ray, C-collar removed prior to x-ray by EDP.

## 2013-04-10 ENCOUNTER — Encounter (HOSPITAL_COMMUNITY): Payer: Self-pay | Admitting: *Deleted

## 2013-04-10 ENCOUNTER — Emergency Department (HOSPITAL_COMMUNITY)
Admission: EM | Admit: 2013-04-10 | Discharge: 2013-04-10 | Disposition: A | Discharge status: Home / Self Care | Payer: PRIVATE HEALTH INSURANCE | Attending: Emergency Medicine | Admitting: Emergency Medicine

## 2013-04-10 ENCOUNTER — Emergency Department (HOSPITAL_COMMUNITY): Payer: PRIVATE HEALTH INSURANCE

## 2013-04-10 DIAGNOSIS — M545 Low back pain, unspecified: Secondary | ICD-10-CM | POA: Insufficient documentation

## 2013-04-10 DIAGNOSIS — E669 Obesity, unspecified: Secondary | ICD-10-CM | POA: Insufficient documentation

## 2013-04-10 DIAGNOSIS — Z87828 Personal history of other (healed) physical injury and trauma: Secondary | ICD-10-CM | POA: Insufficient documentation

## 2013-04-10 DIAGNOSIS — M533 Sacrococcygeal disorders, not elsewhere classified: Secondary | ICD-10-CM | POA: Diagnosis not present

## 2013-04-10 DIAGNOSIS — F319 Bipolar disorder, unspecified: Secondary | ICD-10-CM | POA: Insufficient documentation

## 2013-04-10 DIAGNOSIS — E039 Hypothyroidism, unspecified: Secondary | ICD-10-CM | POA: Insufficient documentation

## 2013-04-10 DIAGNOSIS — Z79899 Other long term (current) drug therapy: Secondary | ICD-10-CM | POA: Insufficient documentation

## 2013-04-10 MED ORDER — HYDROCODONE-ACETAMINOPHEN 5-325 MG PO TABS: 1.0000 | ORAL_TABLET | ORAL | Status: DC | PRN

## 2013-04-10 NOTE — ED Notes (Signed)
Pt fell face forward on sidewalk here on Monday. States continued pain to the chin. States main reason she is here is due to lower back pain.
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
## 2013-04-13 NOTE — ED Provider Notes (Signed)
Medical screening examination/treatment/procedure(s) were performed by non-physician practitioner and as supervising physician I was immediately available for consultation/collaboration.  Lowella Kindley T Lakrisha Iseman, MD 04/13/13 2101 

## 2013-05-08 DIAGNOSIS — R269 Unspecified abnormalities of gait and mobility: Secondary | ICD-10-CM | POA: Diagnosis not present

## 2013-05-08 DIAGNOSIS — R259 Unspecified abnormal involuntary movements: Secondary | ICD-10-CM | POA: Diagnosis not present

## 2013-05-08 DIAGNOSIS — G253 Myoclonus: Secondary | ICD-10-CM | POA: Diagnosis not present

## 2013-05-25 DIAGNOSIS — F3173 Bipolar disorder, in partial remission, most recent episode manic: Secondary | ICD-10-CM | POA: Diagnosis not present

## 2013-05-29 DIAGNOSIS — M47817 Spondylosis without myelopathy or radiculopathy, lumbosacral region: Secondary | ICD-10-CM | POA: Diagnosis not present

## 2013-05-29 DIAGNOSIS — S335XXA Sprain of ligaments of lumbar spine, initial encounter: Secondary | ICD-10-CM | POA: Diagnosis not present

## 2013-05-29 DIAGNOSIS — M999 Biomechanical lesion, unspecified: Secondary | ICD-10-CM | POA: Diagnosis not present

## 2013-06-03 DIAGNOSIS — S335XXA Sprain of ligaments of lumbar spine, initial encounter: Secondary | ICD-10-CM | POA: Diagnosis not present

## 2013-06-03 DIAGNOSIS — M47817 Spondylosis without myelopathy or radiculopathy, lumbosacral region: Secondary | ICD-10-CM | POA: Diagnosis not present

## 2013-06-03 DIAGNOSIS — M999 Biomechanical lesion, unspecified: Secondary | ICD-10-CM | POA: Diagnosis not present

## 2013-06-05 DIAGNOSIS — S335XXA Sprain of ligaments of lumbar spine, initial encounter: Secondary | ICD-10-CM | POA: Diagnosis not present

## 2013-06-05 DIAGNOSIS — M999 Biomechanical lesion, unspecified: Secondary | ICD-10-CM | POA: Diagnosis not present

## 2013-06-05 DIAGNOSIS — M47817 Spondylosis without myelopathy or radiculopathy, lumbosacral region: Secondary | ICD-10-CM | POA: Diagnosis not present

## 2013-06-10 DIAGNOSIS — F3173 Bipolar disorder, in partial remission, most recent episode manic: Secondary | ICD-10-CM | POA: Diagnosis not present

## 2013-06-12 DIAGNOSIS — R262 Difficulty in walking, not elsewhere classified: Secondary | ICD-10-CM | POA: Diagnosis not present

## 2013-06-18 ENCOUNTER — Other Ambulatory Visit: Payer: Self-pay

## 2013-06-18 DIAGNOSIS — R05 Cough: Secondary | ICD-10-CM | POA: Diagnosis not present

## 2013-06-18 DIAGNOSIS — E039 Hypothyroidism, unspecified: Secondary | ICD-10-CM | POA: Diagnosis not present

## 2013-06-18 DIAGNOSIS — R21 Rash and other nonspecific skin eruption: Secondary | ICD-10-CM | POA: Diagnosis not present

## 2013-06-18 DIAGNOSIS — R059 Cough, unspecified: Secondary | ICD-10-CM | POA: Diagnosis not present

## 2013-07-07 DIAGNOSIS — R262 Difficulty in walking, not elsewhere classified: Secondary | ICD-10-CM | POA: Diagnosis not present

## 2013-07-13 DIAGNOSIS — R262 Difficulty in walking, not elsewhere classified: Secondary | ICD-10-CM | POA: Diagnosis not present

## 2013-07-17 DIAGNOSIS — R262 Difficulty in walking, not elsewhere classified: Secondary | ICD-10-CM | POA: Diagnosis not present

## 2013-07-22 DIAGNOSIS — R262 Difficulty in walking, not elsewhere classified: Secondary | ICD-10-CM | POA: Diagnosis not present

## 2013-07-28 DIAGNOSIS — R262 Difficulty in walking, not elsewhere classified: Secondary | ICD-10-CM | POA: Diagnosis not present

## 2013-07-28 DIAGNOSIS — E039 Hypothyroidism, unspecified: Secondary | ICD-10-CM | POA: Diagnosis not present

## 2013-07-30 DIAGNOSIS — F3173 Bipolar disorder, in partial remission, most recent episode manic: Secondary | ICD-10-CM | POA: Diagnosis not present

## 2013-08-04 DIAGNOSIS — H9209 Otalgia, unspecified ear: Secondary | ICD-10-CM | POA: Diagnosis not present

## 2013-08-04 DIAGNOSIS — J020 Streptococcal pharyngitis: Secondary | ICD-10-CM | POA: Diagnosis not present

## 2013-09-04 DIAGNOSIS — F3173 Bipolar disorder, in partial remission, most recent episode manic: Secondary | ICD-10-CM | POA: Diagnosis not present

## 2013-10-19 DIAGNOSIS — R05 Cough: Secondary | ICD-10-CM | POA: Diagnosis not present

## 2013-10-19 DIAGNOSIS — R059 Cough, unspecified: Secondary | ICD-10-CM | POA: Diagnosis not present

## 2013-10-19 DIAGNOSIS — J209 Acute bronchitis, unspecified: Secondary | ICD-10-CM | POA: Diagnosis not present

## 2013-10-31 DIAGNOSIS — J209 Acute bronchitis, unspecified: Secondary | ICD-10-CM | POA: Diagnosis not present

## 2013-10-31 DIAGNOSIS — R062 Wheezing: Secondary | ICD-10-CM | POA: Diagnosis not present

## 2013-11-14 DIAGNOSIS — I10 Essential (primary) hypertension: Secondary | ICD-10-CM | POA: Diagnosis not present

## 2013-11-14 DIAGNOSIS — S42213A Unspecified displaced fracture of surgical neck of unspecified humerus, initial encounter for closed fracture: Secondary | ICD-10-CM | POA: Diagnosis not present

## 2013-11-16 DIAGNOSIS — S42209A Unspecified fracture of upper end of unspecified humerus, initial encounter for closed fracture: Secondary | ICD-10-CM | POA: Diagnosis not present

## 2013-11-16 DIAGNOSIS — S42309A Unspecified fracture of shaft of humerus, unspecified arm, initial encounter for closed fracture: Secondary | ICD-10-CM | POA: Diagnosis not present

## 2013-11-19 DIAGNOSIS — R059 Cough, unspecified: Secondary | ICD-10-CM | POA: Diagnosis not present

## 2013-11-19 DIAGNOSIS — R05 Cough: Secondary | ICD-10-CM | POA: Diagnosis not present

## 2013-11-19 DIAGNOSIS — J209 Acute bronchitis, unspecified: Secondary | ICD-10-CM | POA: Diagnosis not present

## 2013-11-23 DIAGNOSIS — S42209A Unspecified fracture of upper end of unspecified humerus, initial encounter for closed fracture: Secondary | ICD-10-CM | POA: Diagnosis not present

## 2013-11-27 DIAGNOSIS — F319 Bipolar disorder, unspecified: Secondary | ICD-10-CM | POA: Diagnosis not present

## 2013-11-27 DIAGNOSIS — T1490XA Injury, unspecified, initial encounter: Secondary | ICD-10-CM | POA: Diagnosis not present

## 2013-11-27 DIAGNOSIS — S42309A Unspecified fracture of shaft of humerus, unspecified arm, initial encounter for closed fracture: Secondary | ICD-10-CM | POA: Diagnosis not present

## 2013-11-27 DIAGNOSIS — S42209A Unspecified fracture of upper end of unspecified humerus, initial encounter for closed fracture: Secondary | ICD-10-CM | POA: Diagnosis not present

## 2013-11-27 DIAGNOSIS — Z79899 Other long term (current) drug therapy: Secondary | ICD-10-CM | POA: Diagnosis not present

## 2013-11-27 DIAGNOSIS — W19XXXA Unspecified fall, initial encounter: Secondary | ICD-10-CM | POA: Diagnosis not present

## 2013-11-27 DIAGNOSIS — Y92009 Unspecified place in unspecified non-institutional (private) residence as the place of occurrence of the external cause: Secondary | ICD-10-CM | POA: Diagnosis not present

## 2013-11-27 DIAGNOSIS — S42213A Unspecified displaced fracture of surgical neck of unspecified humerus, initial encounter for closed fracture: Secondary | ICD-10-CM | POA: Diagnosis not present

## 2013-12-07 DIAGNOSIS — S42209A Unspecified fracture of upper end of unspecified humerus, initial encounter for closed fracture: Secondary | ICD-10-CM | POA: Diagnosis not present

## 2013-12-08 DIAGNOSIS — E039 Hypothyroidism, unspecified: Secondary | ICD-10-CM | POA: Diagnosis not present

## 2013-12-08 DIAGNOSIS — L570 Actinic keratosis: Secondary | ICD-10-CM | POA: Diagnosis not present

## 2013-12-11 DIAGNOSIS — H524 Presbyopia: Secondary | ICD-10-CM | POA: Diagnosis not present

## 2013-12-11 DIAGNOSIS — H2510 Age-related nuclear cataract, unspecified eye: Secondary | ICD-10-CM | POA: Diagnosis not present

## 2013-12-11 DIAGNOSIS — H43399 Other vitreous opacities, unspecified eye: Secondary | ICD-10-CM | POA: Diagnosis not present

## 2013-12-11 DIAGNOSIS — H52229 Regular astigmatism, unspecified eye: Secondary | ICD-10-CM | POA: Diagnosis not present

## 2013-12-14 DIAGNOSIS — M6281 Muscle weakness (generalized): Secondary | ICD-10-CM | POA: Diagnosis not present

## 2013-12-14 DIAGNOSIS — IMO0001 Reserved for inherently not codable concepts without codable children: Secondary | ICD-10-CM | POA: Diagnosis not present

## 2013-12-14 DIAGNOSIS — M25519 Pain in unspecified shoulder: Secondary | ICD-10-CM | POA: Diagnosis not present

## 2013-12-15 DIAGNOSIS — M25519 Pain in unspecified shoulder: Secondary | ICD-10-CM | POA: Diagnosis not present

## 2013-12-15 DIAGNOSIS — IMO0001 Reserved for inherently not codable concepts without codable children: Secondary | ICD-10-CM | POA: Diagnosis not present

## 2013-12-15 DIAGNOSIS — M6281 Muscle weakness (generalized): Secondary | ICD-10-CM | POA: Diagnosis not present

## 2013-12-17 DIAGNOSIS — M25519 Pain in unspecified shoulder: Secondary | ICD-10-CM | POA: Diagnosis not present

## 2013-12-17 DIAGNOSIS — IMO0001 Reserved for inherently not codable concepts without codable children: Secondary | ICD-10-CM | POA: Diagnosis not present

## 2013-12-17 DIAGNOSIS — M6281 Muscle weakness (generalized): Secondary | ICD-10-CM | POA: Diagnosis not present

## 2013-12-18 DIAGNOSIS — F3173 Bipolar disorder, in partial remission, most recent episode manic: Secondary | ICD-10-CM | POA: Diagnosis not present

## 2013-12-21 DIAGNOSIS — M25519 Pain in unspecified shoulder: Secondary | ICD-10-CM | POA: Diagnosis not present

## 2013-12-21 DIAGNOSIS — M6281 Muscle weakness (generalized): Secondary | ICD-10-CM | POA: Diagnosis not present

## 2013-12-21 DIAGNOSIS — IMO0001 Reserved for inherently not codable concepts without codable children: Secondary | ICD-10-CM | POA: Diagnosis not present

## 2013-12-22 DIAGNOSIS — IMO0001 Reserved for inherently not codable concepts without codable children: Secondary | ICD-10-CM | POA: Diagnosis not present

## 2013-12-22 DIAGNOSIS — M6281 Muscle weakness (generalized): Secondary | ICD-10-CM | POA: Diagnosis not present

## 2013-12-22 DIAGNOSIS — M25519 Pain in unspecified shoulder: Secondary | ICD-10-CM | POA: Diagnosis not present

## 2013-12-24 DIAGNOSIS — IMO0001 Reserved for inherently not codable concepts without codable children: Secondary | ICD-10-CM | POA: Diagnosis not present

## 2013-12-24 DIAGNOSIS — M25519 Pain in unspecified shoulder: Secondary | ICD-10-CM | POA: Diagnosis not present

## 2013-12-24 DIAGNOSIS — M6281 Muscle weakness (generalized): Secondary | ICD-10-CM | POA: Diagnosis not present

## 2013-12-28 DIAGNOSIS — IMO0001 Reserved for inherently not codable concepts without codable children: Secondary | ICD-10-CM | POA: Diagnosis not present

## 2013-12-28 DIAGNOSIS — M25519 Pain in unspecified shoulder: Secondary | ICD-10-CM | POA: Diagnosis not present

## 2013-12-28 DIAGNOSIS — M6281 Muscle weakness (generalized): Secondary | ICD-10-CM | POA: Diagnosis not present

## 2013-12-30 DIAGNOSIS — IMO0001 Reserved for inherently not codable concepts without codable children: Secondary | ICD-10-CM | POA: Diagnosis not present

## 2013-12-30 DIAGNOSIS — M25519 Pain in unspecified shoulder: Secondary | ICD-10-CM | POA: Diagnosis not present

## 2013-12-30 DIAGNOSIS — S42209A Unspecified fracture of upper end of unspecified humerus, initial encounter for closed fracture: Secondary | ICD-10-CM | POA: Diagnosis not present

## 2013-12-30 DIAGNOSIS — M6281 Muscle weakness (generalized): Secondary | ICD-10-CM | POA: Diagnosis not present

## 2013-12-31 DIAGNOSIS — M25519 Pain in unspecified shoulder: Secondary | ICD-10-CM | POA: Diagnosis not present

## 2013-12-31 DIAGNOSIS — M6281 Muscle weakness (generalized): Secondary | ICD-10-CM | POA: Diagnosis not present

## 2013-12-31 DIAGNOSIS — IMO0001 Reserved for inherently not codable concepts without codable children: Secondary | ICD-10-CM | POA: Diagnosis not present

## 2014-01-06 DIAGNOSIS — M25519 Pain in unspecified shoulder: Secondary | ICD-10-CM | POA: Diagnosis not present

## 2014-01-06 DIAGNOSIS — M6281 Muscle weakness (generalized): Secondary | ICD-10-CM | POA: Diagnosis not present

## 2014-01-06 DIAGNOSIS — IMO0001 Reserved for inherently not codable concepts without codable children: Secondary | ICD-10-CM | POA: Diagnosis not present

## 2014-01-07 DIAGNOSIS — M25519 Pain in unspecified shoulder: Secondary | ICD-10-CM | POA: Diagnosis not present

## 2014-01-07 DIAGNOSIS — M6281 Muscle weakness (generalized): Secondary | ICD-10-CM | POA: Diagnosis not present

## 2014-01-07 DIAGNOSIS — IMO0001 Reserved for inherently not codable concepts without codable children: Secondary | ICD-10-CM | POA: Diagnosis not present

## 2014-01-09 DIAGNOSIS — F3132 Bipolar disorder, current episode depressed, moderate: Secondary | ICD-10-CM | POA: Diagnosis not present

## 2014-01-09 DIAGNOSIS — R5381 Other malaise: Secondary | ICD-10-CM | POA: Diagnosis not present

## 2014-01-09 DIAGNOSIS — R5383 Other fatigue: Secondary | ICD-10-CM | POA: Diagnosis not present

## 2014-01-09 DIAGNOSIS — B372 Candidiasis of skin and nail: Secondary | ICD-10-CM | POA: Diagnosis not present

## 2014-01-11 DIAGNOSIS — M6281 Muscle weakness (generalized): Secondary | ICD-10-CM | POA: Diagnosis not present

## 2014-01-11 DIAGNOSIS — M25519 Pain in unspecified shoulder: Secondary | ICD-10-CM | POA: Diagnosis not present

## 2014-01-11 DIAGNOSIS — IMO0001 Reserved for inherently not codable concepts without codable children: Secondary | ICD-10-CM | POA: Diagnosis not present

## 2014-01-13 DIAGNOSIS — IMO0001 Reserved for inherently not codable concepts without codable children: Secondary | ICD-10-CM | POA: Diagnosis not present

## 2014-01-13 DIAGNOSIS — M25519 Pain in unspecified shoulder: Secondary | ICD-10-CM | POA: Diagnosis not present

## 2014-01-13 DIAGNOSIS — M6281 Muscle weakness (generalized): Secondary | ICD-10-CM | POA: Diagnosis not present

## 2014-01-14 DIAGNOSIS — M25519 Pain in unspecified shoulder: Secondary | ICD-10-CM | POA: Diagnosis not present

## 2014-01-14 DIAGNOSIS — M6281 Muscle weakness (generalized): Secondary | ICD-10-CM | POA: Diagnosis not present

## 2014-01-14 DIAGNOSIS — IMO0001 Reserved for inherently not codable concepts without codable children: Secondary | ICD-10-CM | POA: Diagnosis not present

## 2014-01-18 DIAGNOSIS — M6281 Muscle weakness (generalized): Secondary | ICD-10-CM | POA: Diagnosis not present

## 2014-01-18 DIAGNOSIS — IMO0001 Reserved for inherently not codable concepts without codable children: Secondary | ICD-10-CM | POA: Diagnosis not present

## 2014-01-18 DIAGNOSIS — M25519 Pain in unspecified shoulder: Secondary | ICD-10-CM | POA: Diagnosis not present

## 2014-01-20 DIAGNOSIS — F411 Generalized anxiety disorder: Secondary | ICD-10-CM | POA: Diagnosis not present

## 2014-01-20 DIAGNOSIS — R5381 Other malaise: Secondary | ICD-10-CM | POA: Diagnosis not present

## 2014-01-20 DIAGNOSIS — R0602 Shortness of breath: Secondary | ICD-10-CM | POA: Diagnosis not present

## 2014-01-20 DIAGNOSIS — R5383 Other fatigue: Secondary | ICD-10-CM | POA: Diagnosis not present

## 2014-01-21 DIAGNOSIS — IMO0001 Reserved for inherently not codable concepts without codable children: Secondary | ICD-10-CM | POA: Diagnosis not present

## 2014-01-21 DIAGNOSIS — M25519 Pain in unspecified shoulder: Secondary | ICD-10-CM | POA: Diagnosis not present

## 2014-01-21 DIAGNOSIS — M6281 Muscle weakness (generalized): Secondary | ICD-10-CM | POA: Diagnosis not present

## 2014-01-25 DIAGNOSIS — IMO0001 Reserved for inherently not codable concepts without codable children: Secondary | ICD-10-CM | POA: Diagnosis not present

## 2014-01-25 DIAGNOSIS — M6281 Muscle weakness (generalized): Secondary | ICD-10-CM | POA: Diagnosis not present

## 2014-01-25 DIAGNOSIS — M25519 Pain in unspecified shoulder: Secondary | ICD-10-CM | POA: Diagnosis not present

## 2014-01-26 DIAGNOSIS — R5383 Other fatigue: Secondary | ICD-10-CM | POA: Diagnosis not present

## 2014-01-26 DIAGNOSIS — R5381 Other malaise: Secondary | ICD-10-CM | POA: Diagnosis not present

## 2014-01-26 DIAGNOSIS — R0602 Shortness of breath: Secondary | ICD-10-CM | POA: Diagnosis not present

## 2014-01-26 DIAGNOSIS — R9431 Abnormal electrocardiogram [ECG] [EKG]: Secondary | ICD-10-CM | POA: Diagnosis not present

## 2014-01-27 DIAGNOSIS — M6281 Muscle weakness (generalized): Secondary | ICD-10-CM | POA: Diagnosis not present

## 2014-01-27 DIAGNOSIS — M25519 Pain in unspecified shoulder: Secondary | ICD-10-CM | POA: Diagnosis not present

## 2014-01-27 DIAGNOSIS — IMO0001 Reserved for inherently not codable concepts without codable children: Secondary | ICD-10-CM | POA: Diagnosis not present

## 2014-01-27 DIAGNOSIS — S42209A Unspecified fracture of upper end of unspecified humerus, initial encounter for closed fracture: Secondary | ICD-10-CM | POA: Diagnosis not present

## 2014-01-28 DIAGNOSIS — R0602 Shortness of breath: Secondary | ICD-10-CM | POA: Diagnosis not present

## 2014-02-01 DIAGNOSIS — M6281 Muscle weakness (generalized): Secondary | ICD-10-CM | POA: Diagnosis not present

## 2014-02-01 DIAGNOSIS — IMO0001 Reserved for inherently not codable concepts without codable children: Secondary | ICD-10-CM | POA: Diagnosis not present

## 2014-02-01 DIAGNOSIS — M25519 Pain in unspecified shoulder: Secondary | ICD-10-CM | POA: Diagnosis not present

## 2014-02-03 DIAGNOSIS — M6281 Muscle weakness (generalized): Secondary | ICD-10-CM | POA: Diagnosis not present

## 2014-02-03 DIAGNOSIS — M25519 Pain in unspecified shoulder: Secondary | ICD-10-CM | POA: Diagnosis not present

## 2014-02-03 DIAGNOSIS — IMO0001 Reserved for inherently not codable concepts without codable children: Secondary | ICD-10-CM | POA: Diagnosis not present

## 2014-02-04 DIAGNOSIS — R9431 Abnormal electrocardiogram [ECG] [EKG]: Secondary | ICD-10-CM | POA: Diagnosis not present

## 2014-02-04 DIAGNOSIS — IMO0001 Reserved for inherently not codable concepts without codable children: Secondary | ICD-10-CM | POA: Diagnosis not present

## 2014-02-04 DIAGNOSIS — M6281 Muscle weakness (generalized): Secondary | ICD-10-CM | POA: Diagnosis not present

## 2014-02-04 DIAGNOSIS — R0602 Shortness of breath: Secondary | ICD-10-CM | POA: Diagnosis not present

## 2014-02-04 DIAGNOSIS — M25519 Pain in unspecified shoulder: Secondary | ICD-10-CM | POA: Diagnosis not present

## 2014-02-08 DIAGNOSIS — S42209A Unspecified fracture of upper end of unspecified humerus, initial encounter for closed fracture: Secondary | ICD-10-CM | POA: Diagnosis not present

## 2014-02-08 DIAGNOSIS — M6281 Muscle weakness (generalized): Secondary | ICD-10-CM | POA: Diagnosis not present

## 2014-02-08 DIAGNOSIS — M1710 Unilateral primary osteoarthritis, unspecified knee: Secondary | ICD-10-CM | POA: Diagnosis not present

## 2014-02-08 DIAGNOSIS — IMO0002 Reserved for concepts with insufficient information to code with codable children: Secondary | ICD-10-CM | POA: Diagnosis not present

## 2014-02-08 DIAGNOSIS — M25519 Pain in unspecified shoulder: Secondary | ICD-10-CM | POA: Diagnosis not present

## 2014-02-08 DIAGNOSIS — IMO0001 Reserved for inherently not codable concepts without codable children: Secondary | ICD-10-CM | POA: Diagnosis not present

## 2014-02-08 DIAGNOSIS — S83006A Unspecified dislocation of unspecified patella, initial encounter: Secondary | ICD-10-CM | POA: Diagnosis not present

## 2014-02-09 DIAGNOSIS — R0602 Shortness of breath: Secondary | ICD-10-CM | POA: Diagnosis not present

## 2014-02-09 DIAGNOSIS — R0989 Other specified symptoms and signs involving the circulatory and respiratory systems: Secondary | ICD-10-CM | POA: Diagnosis not present

## 2014-02-09 DIAGNOSIS — R0609 Other forms of dyspnea: Secondary | ICD-10-CM | POA: Diagnosis not present

## 2014-02-09 DIAGNOSIS — R9431 Abnormal electrocardiogram [ECG] [EKG]: Secondary | ICD-10-CM | POA: Diagnosis not present

## 2014-02-10 DIAGNOSIS — M6281 Muscle weakness (generalized): Secondary | ICD-10-CM | POA: Diagnosis not present

## 2014-02-10 DIAGNOSIS — M25519 Pain in unspecified shoulder: Secondary | ICD-10-CM | POA: Diagnosis not present

## 2014-02-10 DIAGNOSIS — IMO0001 Reserved for inherently not codable concepts without codable children: Secondary | ICD-10-CM | POA: Diagnosis not present

## 2014-02-11 DIAGNOSIS — IMO0001 Reserved for inherently not codable concepts without codable children: Secondary | ICD-10-CM | POA: Diagnosis not present

## 2014-02-11 DIAGNOSIS — M25519 Pain in unspecified shoulder: Secondary | ICD-10-CM | POA: Diagnosis not present

## 2014-02-11 DIAGNOSIS — M6281 Muscle weakness (generalized): Secondary | ICD-10-CM | POA: Diagnosis not present

## 2014-02-15 DIAGNOSIS — M6281 Muscle weakness (generalized): Secondary | ICD-10-CM | POA: Diagnosis not present

## 2014-02-15 DIAGNOSIS — M25519 Pain in unspecified shoulder: Secondary | ICD-10-CM | POA: Diagnosis not present

## 2014-02-15 DIAGNOSIS — IMO0001 Reserved for inherently not codable concepts without codable children: Secondary | ICD-10-CM | POA: Diagnosis not present

## 2014-02-17 DIAGNOSIS — IMO0001 Reserved for inherently not codable concepts without codable children: Secondary | ICD-10-CM | POA: Diagnosis not present

## 2014-02-17 DIAGNOSIS — M6281 Muscle weakness (generalized): Secondary | ICD-10-CM | POA: Diagnosis not present

## 2014-02-17 DIAGNOSIS — M25519 Pain in unspecified shoulder: Secondary | ICD-10-CM | POA: Diagnosis not present

## 2014-02-22 DIAGNOSIS — M25519 Pain in unspecified shoulder: Secondary | ICD-10-CM | POA: Diagnosis not present

## 2014-02-22 DIAGNOSIS — M6281 Muscle weakness (generalized): Secondary | ICD-10-CM | POA: Diagnosis not present

## 2014-02-22 DIAGNOSIS — IMO0001 Reserved for inherently not codable concepts without codable children: Secondary | ICD-10-CM | POA: Diagnosis not present

## 2014-02-24 DIAGNOSIS — IMO0002 Reserved for concepts with insufficient information to code with codable children: Secondary | ICD-10-CM | POA: Diagnosis not present

## 2014-02-24 DIAGNOSIS — S42209A Unspecified fracture of upper end of unspecified humerus, initial encounter for closed fracture: Secondary | ICD-10-CM | POA: Diagnosis not present

## 2014-02-24 DIAGNOSIS — M1710 Unilateral primary osteoarthritis, unspecified knee: Secondary | ICD-10-CM | POA: Diagnosis not present

## 2014-03-01 DIAGNOSIS — M25519 Pain in unspecified shoulder: Secondary | ICD-10-CM | POA: Diagnosis not present

## 2014-03-01 DIAGNOSIS — IMO0001 Reserved for inherently not codable concepts without codable children: Secondary | ICD-10-CM | POA: Diagnosis not present

## 2014-03-01 DIAGNOSIS — M6281 Muscle weakness (generalized): Secondary | ICD-10-CM | POA: Diagnosis not present

## 2014-03-03 DIAGNOSIS — IMO0001 Reserved for inherently not codable concepts without codable children: Secondary | ICD-10-CM | POA: Diagnosis not present

## 2014-03-03 DIAGNOSIS — M6281 Muscle weakness (generalized): Secondary | ICD-10-CM | POA: Diagnosis not present

## 2014-03-03 DIAGNOSIS — M25519 Pain in unspecified shoulder: Secondary | ICD-10-CM | POA: Diagnosis not present

## 2014-03-08 DIAGNOSIS — M6281 Muscle weakness (generalized): Secondary | ICD-10-CM | POA: Diagnosis not present

## 2014-03-08 DIAGNOSIS — IMO0001 Reserved for inherently not codable concepts without codable children: Secondary | ICD-10-CM | POA: Diagnosis not present

## 2014-03-08 DIAGNOSIS — M25519 Pain in unspecified shoulder: Secondary | ICD-10-CM | POA: Diagnosis not present

## 2014-03-10 DIAGNOSIS — M6281 Muscle weakness (generalized): Secondary | ICD-10-CM | POA: Diagnosis not present

## 2014-03-10 DIAGNOSIS — IMO0001 Reserved for inherently not codable concepts without codable children: Secondary | ICD-10-CM | POA: Diagnosis not present

## 2014-03-10 DIAGNOSIS — M25519 Pain in unspecified shoulder: Secondary | ICD-10-CM | POA: Diagnosis not present

## 2014-03-15 DIAGNOSIS — M25519 Pain in unspecified shoulder: Secondary | ICD-10-CM | POA: Diagnosis not present

## 2014-03-15 DIAGNOSIS — IMO0001 Reserved for inherently not codable concepts without codable children: Secondary | ICD-10-CM | POA: Diagnosis not present

## 2014-03-15 DIAGNOSIS — M6281 Muscle weakness (generalized): Secondary | ICD-10-CM | POA: Diagnosis not present

## 2014-03-29 DIAGNOSIS — B372 Candidiasis of skin and nail: Secondary | ICD-10-CM | POA: Diagnosis not present

## 2014-04-02 DIAGNOSIS — IMO0001 Reserved for inherently not codable concepts without codable children: Secondary | ICD-10-CM | POA: Diagnosis not present

## 2014-04-02 DIAGNOSIS — M6281 Muscle weakness (generalized): Secondary | ICD-10-CM | POA: Diagnosis not present

## 2014-04-02 DIAGNOSIS — M25519 Pain in unspecified shoulder: Secondary | ICD-10-CM | POA: Diagnosis not present

## 2014-04-08 DIAGNOSIS — F3173 Bipolar disorder, in partial remission, most recent episode manic: Secondary | ICD-10-CM | POA: Diagnosis not present

## 2014-04-13 DIAGNOSIS — IMO0002 Reserved for concepts with insufficient information to code with codable children: Secondary | ICD-10-CM | POA: Diagnosis not present

## 2014-04-13 DIAGNOSIS — M6281 Muscle weakness (generalized): Secondary | ICD-10-CM | POA: Diagnosis not present

## 2014-04-13 DIAGNOSIS — R269 Unspecified abnormalities of gait and mobility: Secondary | ICD-10-CM | POA: Diagnosis not present

## 2014-04-13 DIAGNOSIS — M1710 Unilateral primary osteoarthritis, unspecified knee: Secondary | ICD-10-CM | POA: Diagnosis not present

## 2014-04-13 DIAGNOSIS — IMO0001 Reserved for inherently not codable concepts without codable children: Secondary | ICD-10-CM | POA: Diagnosis not present

## 2014-04-14 DIAGNOSIS — L0100 Impetigo, unspecified: Secondary | ICD-10-CM | POA: Diagnosis not present

## 2014-04-14 DIAGNOSIS — L538 Other specified erythematous conditions: Secondary | ICD-10-CM | POA: Diagnosis not present

## 2014-04-14 DIAGNOSIS — L259 Unspecified contact dermatitis, unspecified cause: Secondary | ICD-10-CM | POA: Diagnosis not present

## 2014-04-16 DIAGNOSIS — M6281 Muscle weakness (generalized): Secondary | ICD-10-CM | POA: Diagnosis not present

## 2014-04-16 DIAGNOSIS — R269 Unspecified abnormalities of gait and mobility: Secondary | ICD-10-CM | POA: Diagnosis not present

## 2014-04-16 DIAGNOSIS — IMO0001 Reserved for inherently not codable concepts without codable children: Secondary | ICD-10-CM | POA: Diagnosis not present

## 2014-04-16 DIAGNOSIS — IMO0002 Reserved for concepts with insufficient information to code with codable children: Secondary | ICD-10-CM | POA: Diagnosis not present

## 2014-04-16 DIAGNOSIS — M1710 Unilateral primary osteoarthritis, unspecified knee: Secondary | ICD-10-CM | POA: Diagnosis not present

## 2014-04-26 DIAGNOSIS — IMO0002 Reserved for concepts with insufficient information to code with codable children: Secondary | ICD-10-CM | POA: Diagnosis not present

## 2014-04-26 DIAGNOSIS — M1710 Unilateral primary osteoarthritis, unspecified knee: Secondary | ICD-10-CM | POA: Diagnosis not present

## 2014-04-26 DIAGNOSIS — IMO0001 Reserved for inherently not codable concepts without codable children: Secondary | ICD-10-CM | POA: Diagnosis not present

## 2014-04-26 DIAGNOSIS — R269 Unspecified abnormalities of gait and mobility: Secondary | ICD-10-CM | POA: Diagnosis not present

## 2014-04-26 DIAGNOSIS — M6281 Muscle weakness (generalized): Secondary | ICD-10-CM | POA: Diagnosis not present

## 2014-04-28 DIAGNOSIS — L538 Other specified erythematous conditions: Secondary | ICD-10-CM | POA: Diagnosis not present

## 2014-04-28 DIAGNOSIS — M1710 Unilateral primary osteoarthritis, unspecified knee: Secondary | ICD-10-CM | POA: Diagnosis not present

## 2014-04-28 DIAGNOSIS — R269 Unspecified abnormalities of gait and mobility: Secondary | ICD-10-CM | POA: Diagnosis not present

## 2014-04-28 DIAGNOSIS — Z85828 Personal history of other malignant neoplasm of skin: Secondary | ICD-10-CM | POA: Diagnosis not present

## 2014-04-28 DIAGNOSIS — M6281 Muscle weakness (generalized): Secondary | ICD-10-CM | POA: Diagnosis not present

## 2014-04-28 DIAGNOSIS — IMO0001 Reserved for inherently not codable concepts without codable children: Secondary | ICD-10-CM | POA: Diagnosis not present

## 2014-04-28 DIAGNOSIS — IMO0002 Reserved for concepts with insufficient information to code with codable children: Secondary | ICD-10-CM | POA: Diagnosis not present

## 2014-05-03 DIAGNOSIS — M6281 Muscle weakness (generalized): Secondary | ICD-10-CM | POA: Diagnosis not present

## 2014-05-03 DIAGNOSIS — R269 Unspecified abnormalities of gait and mobility: Secondary | ICD-10-CM | POA: Diagnosis not present

## 2014-05-03 DIAGNOSIS — M1710 Unilateral primary osteoarthritis, unspecified knee: Secondary | ICD-10-CM | POA: Diagnosis not present

## 2014-05-03 DIAGNOSIS — IMO0002 Reserved for concepts with insufficient information to code with codable children: Secondary | ICD-10-CM | POA: Diagnosis not present

## 2014-05-03 DIAGNOSIS — IMO0001 Reserved for inherently not codable concepts without codable children: Secondary | ICD-10-CM | POA: Diagnosis not present

## 2014-05-05 DIAGNOSIS — M6281 Muscle weakness (generalized): Secondary | ICD-10-CM | POA: Diagnosis not present

## 2014-05-05 DIAGNOSIS — R269 Unspecified abnormalities of gait and mobility: Secondary | ICD-10-CM | POA: Diagnosis not present

## 2014-05-05 DIAGNOSIS — IMO0002 Reserved for concepts with insufficient information to code with codable children: Secondary | ICD-10-CM | POA: Diagnosis not present

## 2014-05-05 DIAGNOSIS — M1710 Unilateral primary osteoarthritis, unspecified knee: Secondary | ICD-10-CM | POA: Diagnosis not present

## 2014-05-05 DIAGNOSIS — IMO0001 Reserved for inherently not codable concepts without codable children: Secondary | ICD-10-CM | POA: Diagnosis not present

## 2014-05-10 DIAGNOSIS — M1710 Unilateral primary osteoarthritis, unspecified knee: Secondary | ICD-10-CM | POA: Diagnosis not present

## 2014-05-10 DIAGNOSIS — R269 Unspecified abnormalities of gait and mobility: Secondary | ICD-10-CM | POA: Diagnosis not present

## 2014-05-10 DIAGNOSIS — IMO0002 Reserved for concepts with insufficient information to code with codable children: Secondary | ICD-10-CM | POA: Diagnosis not present

## 2014-05-10 DIAGNOSIS — IMO0001 Reserved for inherently not codable concepts without codable children: Secondary | ICD-10-CM | POA: Diagnosis not present

## 2014-05-10 DIAGNOSIS — M6281 Muscle weakness (generalized): Secondary | ICD-10-CM | POA: Diagnosis not present

## 2014-05-20 DIAGNOSIS — R2681 Unsteadiness on feet: Secondary | ICD-10-CM | POA: Diagnosis not present

## 2014-05-20 DIAGNOSIS — M1990 Unspecified osteoarthritis, unspecified site: Secondary | ICD-10-CM | POA: Diagnosis not present

## 2014-05-20 DIAGNOSIS — M25561 Pain in right knee: Secondary | ICD-10-CM | POA: Diagnosis not present

## 2014-05-20 DIAGNOSIS — M25562 Pain in left knee: Secondary | ICD-10-CM | POA: Diagnosis not present

## 2014-05-20 DIAGNOSIS — M6281 Muscle weakness (generalized): Secondary | ICD-10-CM | POA: Diagnosis not present

## 2014-05-21 DIAGNOSIS — M25562 Pain in left knee: Secondary | ICD-10-CM | POA: Diagnosis not present

## 2014-05-21 DIAGNOSIS — M1990 Unspecified osteoarthritis, unspecified site: Secondary | ICD-10-CM | POA: Diagnosis not present

## 2014-05-21 DIAGNOSIS — M6281 Muscle weakness (generalized): Secondary | ICD-10-CM | POA: Diagnosis not present

## 2014-05-21 DIAGNOSIS — R2681 Unsteadiness on feet: Secondary | ICD-10-CM | POA: Diagnosis not present

## 2014-05-21 DIAGNOSIS — M25561 Pain in right knee: Secondary | ICD-10-CM | POA: Diagnosis not present

## 2014-05-24 DIAGNOSIS — M1990 Unspecified osteoarthritis, unspecified site: Secondary | ICD-10-CM | POA: Diagnosis not present

## 2014-05-24 DIAGNOSIS — M25562 Pain in left knee: Secondary | ICD-10-CM | POA: Diagnosis not present

## 2014-05-24 DIAGNOSIS — M25561 Pain in right knee: Secondary | ICD-10-CM | POA: Diagnosis not present

## 2014-05-24 DIAGNOSIS — M6281 Muscle weakness (generalized): Secondary | ICD-10-CM | POA: Diagnosis not present

## 2014-05-24 DIAGNOSIS — R2681 Unsteadiness on feet: Secondary | ICD-10-CM | POA: Diagnosis not present

## 2014-05-27 DIAGNOSIS — M25562 Pain in left knee: Secondary | ICD-10-CM | POA: Diagnosis not present

## 2014-05-27 DIAGNOSIS — M1712 Unilateral primary osteoarthritis, left knee: Secondary | ICD-10-CM | POA: Diagnosis not present

## 2014-06-01 DIAGNOSIS — M25562 Pain in left knee: Secondary | ICD-10-CM | POA: Diagnosis not present

## 2014-06-01 DIAGNOSIS — M1990 Unspecified osteoarthritis, unspecified site: Secondary | ICD-10-CM | POA: Diagnosis not present

## 2014-06-01 DIAGNOSIS — M25561 Pain in right knee: Secondary | ICD-10-CM | POA: Diagnosis not present

## 2014-06-01 DIAGNOSIS — R2681 Unsteadiness on feet: Secondary | ICD-10-CM | POA: Diagnosis not present

## 2014-06-01 DIAGNOSIS — M6281 Muscle weakness (generalized): Secondary | ICD-10-CM | POA: Diagnosis not present

## 2014-06-04 DIAGNOSIS — M25561 Pain in right knee: Secondary | ICD-10-CM | POA: Diagnosis not present

## 2014-06-04 DIAGNOSIS — M1990 Unspecified osteoarthritis, unspecified site: Secondary | ICD-10-CM | POA: Diagnosis not present

## 2014-06-04 DIAGNOSIS — M25562 Pain in left knee: Secondary | ICD-10-CM | POA: Diagnosis not present

## 2014-06-04 DIAGNOSIS — M6281 Muscle weakness (generalized): Secondary | ICD-10-CM | POA: Diagnosis not present

## 2014-06-04 DIAGNOSIS — R2681 Unsteadiness on feet: Secondary | ICD-10-CM | POA: Diagnosis not present

## 2014-06-07 DIAGNOSIS — R2681 Unsteadiness on feet: Secondary | ICD-10-CM | POA: Diagnosis not present

## 2014-06-07 DIAGNOSIS — M1990 Unspecified osteoarthritis, unspecified site: Secondary | ICD-10-CM | POA: Diagnosis not present

## 2014-06-07 DIAGNOSIS — M25562 Pain in left knee: Secondary | ICD-10-CM | POA: Diagnosis not present

## 2014-06-07 DIAGNOSIS — M25561 Pain in right knee: Secondary | ICD-10-CM | POA: Diagnosis not present

## 2014-06-07 DIAGNOSIS — M6281 Muscle weakness (generalized): Secondary | ICD-10-CM | POA: Diagnosis not present

## 2014-06-09 DIAGNOSIS — M25562 Pain in left knee: Secondary | ICD-10-CM | POA: Diagnosis not present

## 2014-06-09 DIAGNOSIS — M25561 Pain in right knee: Secondary | ICD-10-CM | POA: Diagnosis not present

## 2014-06-09 DIAGNOSIS — M6281 Muscle weakness (generalized): Secondary | ICD-10-CM | POA: Diagnosis not present

## 2014-06-09 DIAGNOSIS — R2681 Unsteadiness on feet: Secondary | ICD-10-CM | POA: Diagnosis not present

## 2014-06-09 DIAGNOSIS — M1990 Unspecified osteoarthritis, unspecified site: Secondary | ICD-10-CM | POA: Diagnosis not present

## 2014-06-17 DIAGNOSIS — Z9882 Breast implant status: Secondary | ICD-10-CM | POA: Diagnosis not present

## 2014-06-17 DIAGNOSIS — Z1231 Encounter for screening mammogram for malignant neoplasm of breast: Secondary | ICD-10-CM | POA: Diagnosis not present

## 2014-06-21 DIAGNOSIS — D485 Neoplasm of uncertain behavior of skin: Secondary | ICD-10-CM | POA: Diagnosis not present

## 2014-06-21 DIAGNOSIS — L570 Actinic keratosis: Secondary | ICD-10-CM | POA: Diagnosis not present

## 2014-06-21 DIAGNOSIS — Z85828 Personal history of other malignant neoplasm of skin: Secondary | ICD-10-CM | POA: Diagnosis not present

## 2014-06-26 DIAGNOSIS — J209 Acute bronchitis, unspecified: Secondary | ICD-10-CM | POA: Diagnosis not present

## 2014-07-29 DIAGNOSIS — F3173 Bipolar disorder, in partial remission, most recent episode manic: Secondary | ICD-10-CM | POA: Diagnosis not present

## 2014-09-20 DIAGNOSIS — H918X9 Other specified hearing loss, unspecified ear: Secondary | ICD-10-CM | POA: Diagnosis not present

## 2014-09-20 DIAGNOSIS — S0990XA Unspecified injury of head, initial encounter: Secondary | ICD-10-CM | POA: Diagnosis not present

## 2014-09-20 DIAGNOSIS — S0093XA Contusion of unspecified part of head, initial encounter: Secondary | ICD-10-CM | POA: Diagnosis not present

## 2014-09-20 DIAGNOSIS — R51 Headache: Secondary | ICD-10-CM | POA: Diagnosis not present

## 2014-09-20 DIAGNOSIS — R410 Disorientation, unspecified: Secondary | ICD-10-CM | POA: Diagnosis not present

## 2014-12-03 DIAGNOSIS — E039 Hypothyroidism, unspecified: Secondary | ICD-10-CM | POA: Diagnosis not present

## 2014-12-16 DIAGNOSIS — F3173 Bipolar disorder, in partial remission, most recent episode manic: Secondary | ICD-10-CM | POA: Diagnosis not present

## 2015-02-07 ENCOUNTER — Other Ambulatory Visit: Payer: Self-pay

## 2015-03-23 DIAGNOSIS — M7500 Adhesive capsulitis of unspecified shoulder: Secondary | ICD-10-CM | POA: Diagnosis not present

## 2015-03-23 DIAGNOSIS — M25512 Pain in left shoulder: Secondary | ICD-10-CM | POA: Diagnosis not present

## 2015-03-23 DIAGNOSIS — M75102 Unspecified rotator cuff tear or rupture of left shoulder, not specified as traumatic: Secondary | ICD-10-CM | POA: Diagnosis not present

## 2015-04-06 DIAGNOSIS — E039 Hypothyroidism, unspecified: Secondary | ICD-10-CM | POA: Diagnosis not present

## 2015-04-06 DIAGNOSIS — R5383 Other fatigue: Secondary | ICD-10-CM | POA: Diagnosis not present

## 2015-04-06 DIAGNOSIS — K580 Irritable bowel syndrome with diarrhea: Secondary | ICD-10-CM | POA: Diagnosis not present

## 2015-04-06 DIAGNOSIS — J209 Acute bronchitis, unspecified: Secondary | ICD-10-CM | POA: Diagnosis not present

## 2015-06-02 DIAGNOSIS — F3173 Bipolar disorder, in partial remission, most recent episode manic: Secondary | ICD-10-CM | POA: Diagnosis not present

## 2015-08-25 DIAGNOSIS — F3173 Bipolar disorder, in partial remission, most recent episode manic: Secondary | ICD-10-CM | POA: Diagnosis not present

## 2015-09-21 DIAGNOSIS — F3173 Bipolar disorder, in partial remission, most recent episode manic: Secondary | ICD-10-CM | POA: Diagnosis not present

## 2015-10-19 DIAGNOSIS — F3173 Bipolar disorder, in partial remission, most recent episode manic: Secondary | ICD-10-CM | POA: Diagnosis not present

## 2015-10-24 DIAGNOSIS — Z1231 Encounter for screening mammogram for malignant neoplasm of breast: Secondary | ICD-10-CM | POA: Diagnosis not present

## 2015-10-24 DIAGNOSIS — Z9882 Breast implant status: Secondary | ICD-10-CM | POA: Diagnosis not present

## 2015-10-31 DIAGNOSIS — B372 Candidiasis of skin and nail: Secondary | ICD-10-CM | POA: Diagnosis not present

## 2015-11-08 DIAGNOSIS — L259 Unspecified contact dermatitis, unspecified cause: Secondary | ICD-10-CM | POA: Diagnosis not present

## 2015-11-08 DIAGNOSIS — L309 Dermatitis, unspecified: Secondary | ICD-10-CM | POA: Diagnosis not present

## 2015-11-08 DIAGNOSIS — D485 Neoplasm of uncertain behavior of skin: Secondary | ICD-10-CM | POA: Diagnosis not present

## 2015-11-30 DIAGNOSIS — K1370 Unspecified lesions of oral mucosa: Secondary | ICD-10-CM | POA: Diagnosis not present

## 2015-12-03 DIAGNOSIS — Z79899 Other long term (current) drug therapy: Secondary | ICD-10-CM | POA: Diagnosis not present

## 2015-12-03 DIAGNOSIS — B349 Viral infection, unspecified: Secondary | ICD-10-CM | POA: Diagnosis not present

## 2015-12-03 DIAGNOSIS — H8309 Labyrinthitis, unspecified ear: Secondary | ICD-10-CM | POA: Diagnosis not present

## 2015-12-03 DIAGNOSIS — R197 Diarrhea, unspecified: Secondary | ICD-10-CM | POA: Diagnosis not present

## 2015-12-03 DIAGNOSIS — H8301 Labyrinthitis, right ear: Secondary | ICD-10-CM | POA: Diagnosis not present

## 2015-12-03 DIAGNOSIS — R112 Nausea with vomiting, unspecified: Secondary | ICD-10-CM | POA: Diagnosis not present

## 2015-12-03 DIAGNOSIS — R51 Headache: Secondary | ICD-10-CM | POA: Diagnosis not present

## 2015-12-03 DIAGNOSIS — J811 Chronic pulmonary edema: Secondary | ICD-10-CM | POA: Diagnosis not present

## 2015-12-03 DIAGNOSIS — R42 Dizziness and giddiness: Secondary | ICD-10-CM | POA: Diagnosis not present

## 2015-12-12 DIAGNOSIS — R300 Dysuria: Secondary | ICD-10-CM | POA: Diagnosis not present

## 2015-12-12 DIAGNOSIS — N390 Urinary tract infection, site not specified: Secondary | ICD-10-CM | POA: Diagnosis not present

## 2015-12-12 DIAGNOSIS — H8309 Labyrinthitis, unspecified ear: Secondary | ICD-10-CM | POA: Diagnosis not present

## 2015-12-12 DIAGNOSIS — Z1624 Resistance to multiple antibiotics: Secondary | ICD-10-CM | POA: Diagnosis not present

## 2016-03-22 DIAGNOSIS — F3173 Bipolar disorder, in partial remission, most recent episode manic: Secondary | ICD-10-CM | POA: Diagnosis not present

## 2016-04-13 ENCOUNTER — Other Ambulatory Visit: Payer: Self-pay

## 2016-05-29 ENCOUNTER — Ambulatory Visit (INDEPENDENT_AMBULATORY_CARE_PROVIDER_SITE_OTHER): Payer: Medicare Other | Admitting: Internal Medicine

## 2016-05-29 ENCOUNTER — Encounter: Payer: Self-pay | Admitting: Internal Medicine

## 2016-05-29 ENCOUNTER — Other Ambulatory Visit: Payer: Self-pay

## 2016-05-29 VITALS — BP 134/80 | HR 81 | Temp 98.0°F | Ht 67.0 in | Wt 222.0 lb

## 2016-05-29 DIAGNOSIS — R197 Diarrhea, unspecified: Secondary | ICD-10-CM | POA: Diagnosis not present

## 2016-05-29 DIAGNOSIS — K529 Noninfective gastroenteritis and colitis, unspecified: Secondary | ICD-10-CM

## 2016-05-29 MED ORDER — PEG 3350-KCL-NA BICARB-NACL 420 G PO SOLR: 4000.0000 mL | ORAL | 0 refills | Status: DC

## 2016-05-29 NOTE — Patient Instructions (Signed)
Schedule a diagnostic colonoscopy - Chronic diarrhea - propofol sedation  Further recommendations to follow
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
## 2016-05-29 NOTE — Patient Instructions (Signed)
Schedule a diagnostic colonoscopy-chronic diarrhea -- propofol  Further recommendations to follow.

## 2016-06-05 NOTE — Patient Instructions (Signed)
Annette Johnston  06/05/2016     @PREFPERIOPPHARMACY @   Your procedure is scheduled on  06/11/2016  Report to [facility] at  21  A.M.  Call this number if you have problems the morning of surgery:  [phone]   Remember:  Do not eat food or drink liquids after midnight.  Take these medicines the morning of surgery with A SIP OF WATER  [name], hydrocodone, synthroid, ativan, paxil.   Do not wear jewelry, make-up or nail polish.  Do not wear lotions, powders, or perfumes, or deoderant.  Do not shave 48 hours prior to surgery.  Men may shave face and neck.  Do not bring valuables to the hospital.  [facility] is not responsible for any belongings or valuables.  Contacts, dentures or bridgework may not be worn into surgery.  Leave your suitcase in the car.  After surgery it may be brought to your room.  For patients admitted to the hospital, discharge time will be determined by your treatment team.  Patients discharged the day of surgery will not be allowed to drive home.   Name and phone number of your driver:   family Special instructions:  Follow the diet and prep instructions given to you by Dr Roseanne Kaufman office.  Please read over the following fact sheets that you were given. Anesthesia Post-op Instructions and Care and Recovery After Surgery       Colonoscopy A colonoscopy is an exam to look at the entire large intestine (colon). This exam can help find problems such as tumors, polyps, inflammation, and areas of bleeding. The exam takes about 1 hour.  LET [facility] CARE PROVIDER KNOW ABOUT:   Any allergies you have.  All medicines you are taking, including vitamins, herbs, eye drops, creams, and over-the-counter medicines.  Previous problems you or members of your family have had with the use of anesthetics.  Any blood disorders you have.  Previous surgeries you have had.  Medical conditions you have. RISKS AND COMPLICATIONS  Generally, this  is a safe procedure. However, as with any procedure, complications can occur. Possible complications include:  Bleeding.  Tearing or rupture of the colon wall.  Reaction to medicines given during the exam.  Infection (rare). BEFORE THE PROCEDURE   Ask your health care provider about changing or stopping your regular medicines.  You may be prescribed an oral bowel prep. This involves drinking a large amount of medicated liquid, starting the day before your procedure. The liquid will cause you to have multiple loose stools until your stool is almost clear or light green. This cleans out your colon in preparation for the procedure.  Do not eat or drink anything else once you have started the bowel prep, unless your health care provider tells you it is safe to do so.  Arrange for someone to drive you home after the procedure. PROCEDURE   You will be given medicine to help you relax (sedative).  You will lie on your side with your knees bent.  A long, flexible tube with a light and camera on the end (colonoscope) will be inserted through the rectum and into the colon. The camera sends video back to a computer screen as it moves through the colon. The colonoscope also releases carbon dioxide gas to inflate the colon. This helps your health care provider see the area better.  During the exam, your health care provider may take a small tissue sample (  biopsy) to be examined under a microscope if any abnormalities are found.  The exam is finished when the entire colon has been viewed. AFTER THE PROCEDURE   Do not drive for 24 hours after the exam.  You may have a small amount of blood in your stool.  You may pass moderate amounts of gas and have mild abdominal cramping or bloating. This is caused by the gas used to inflate your colon during the exam.  Ask when your test results will be ready and how you will get your results. Make sure you get your test results.   This information is not  intended to replace advice given to you by your health care provider. Make sure you discuss any questions you have with your health care provider.   Document Released: 07/27/2000 Document Revised: 05/20/2013 Document Reviewed: 04/06/2013 Elsevier Interactive Patient Education 2016 [employer] Colonoscopy, Care After Refer to this sheet in the next few weeks. These instructions provide you with information on caring for yourself after your procedure. Your health care provider may also give you more specific instructions. Your treatment has been planned according to current medical practices, but problems sometimes occur. Call your health care provider if you have any problems or questions after your procedure. WHAT TO EXPECT AFTER THE PROCEDURE  After your procedure, it is typical to have the following:  A small amount of blood in your stool.  Moderate amounts of gas and mild abdominal cramping or bloating. HOME CARE INSTRUCTIONS  Do not drive, operate machinery, or sign important documents for 24 hours.  You may shower and resume your regular physical activities, but move at a slower pace for the first 24 hours.  Take frequent rest periods for the first 24 hours.  Walk around or put a warm pack on your abdomen to help reduce abdominal cramping and bloating.  Drink enough fluids to keep your urine clear or pale yellow.  You may resume your normal diet as instructed by your health care provider. Avoid heavy or fried foods that are hard to digest.  Avoid drinking alcohol for 24 hours or as instructed by your health care provider.  Only take over-the-counter or prescription medicines as directed by your health care provider.  If a tissue sample (biopsy) was taken during your procedure:  Do not take aspirin or blood thinners for 7 days, or as instructed by your health care provider.  Do not drink alcohol for 7 days, or as instructed by your health care provider.  Eat soft foods for  the first 24 hours. SEEK MEDICAL CARE IF: You have persistent spotting of blood in your stool 2-3 days after the procedure. SEEK IMMEDIATE MEDICAL CARE IF:  You have more than a small spotting of blood in your stool.  You pass large blood clots in your stool.  Your abdomen is swollen (distended).  You have nausea or vomiting.  You have a fever.  You have increasing abdominal pain that is not relieved with medicine.   This information is not intended to replace advice given to you by your health care provider. Make sure you discuss any questions you have with your health care provider.   Document Released: 03/13/2004 Document Revised: 05/20/2013 Document Reviewed: 04/06/2013 Elsevier Interactive Patient Education 2016 [place] Monitored anesthesia care is an anesthesia service for a medical procedure. Anesthesia is the loss of the ability to feel pain. It is produced by medicines called anesthetics. It may affect a small area  of your body (local anesthesia), a large area of your body (regional anesthesia), or your entire body (general anesthesia). The need for monitored anesthesia care depends your procedure, your condition, and the potential need for regional or general anesthesia. It is often provided during procedures where:   General anesthesia may be needed if there are complications. This is because you need special care when you are under general anesthesia.   You will be under local or regional anesthesia. This is so that you are able to have higher levels of anesthesia if needed.   You will receive calming medicines (sedatives). This is especially the case if sedatives are given to put you in a semi-conscious state of relaxation (deep sedation). This is because the amount of sedative needed to produce this state can be hard to predict. Too much of a sedative can produce general anesthesia. Monitored anesthesia care is performed by one or more  health care providers who have special training in all types of anesthesia. You will need to meet with these health care providers before your procedure. During this meeting, they will ask you about your medical history. They will also give you instructions to follow. (For example, you will need to stop eating and drinking before your procedure. You may also need to stop or change medicines you are taking.) During your procedure, your health care providers will stay with you. They will:   Watch your condition. This includes watching your blood pressure, breathing, and level of pain.   Diagnose and treat problems that occur.   Give medicines if they are needed. These may include calming medicines (sedatives) and anesthetics.   Make sure you are comfortable.  Having monitored anesthesia care does not necessarily mean that you will be under anesthesia. It does mean that your health care providers will be able to manage anesthesia if you need it or if it occurs. It also means that you will be able to have a different type of anesthesia than you are having if you need it. When your procedure is complete, your health care providers will continue to watch your condition. They will make sure any medicines wear off before you are allowed to go home.    This information is not intended to replace advice given to you by your health care provider. Make sure you discuss any questions you have with your health care provider.   Document Released: 04/25/2005 Document Revised: 08/20/2014 Document Reviewed: 09/10/2012 Elsevier Interactive Patient Education 2016 Elsevier Inc. PATIENT INSTRUCTIONS POST-ANESTHESIA  IMMEDIATELY FOLLOWING SURGERY:  Do not drive or operate machinery for the first twenty four hours after surgery.  Do not make any important decisions for twenty four hours after surgery or while taking narcotic pain medications or sedatives.  If you develop intractable nausea and vomiting or a severe  headache please notify your doctor immediately.  FOLLOW-UP:  Please make an appointment with your surgeon as instructed. You do not need to follow up with anesthesia unless specifically instructed to do so.  WOUND CARE INSTRUCTIONS (if applicable):  Keep a dry clean dressing on the anesthesia/puncture wound site if there is drainage.  Once the wound has quit draining you may leave it open to air.  Generally you should leave the bandage intact for twenty four hours unless there is drainage.  If the epidural site drains for more than 36-48 hours please call the anesthesia department.  QUESTIONS?:  Please feel free to call your physician or the hospital operator if you have  any questions, and they will be happy to assist you.

## 2016-06-06 ENCOUNTER — Encounter (HOSPITAL_COMMUNITY)
Admission: RE | Admit: 2016-06-06 | Discharge: 2016-06-06 | Disposition: A | Discharge status: Home / Self Care | Payer: Medicare Other | Source: Ambulatory Visit | Attending: Internal Medicine | Admitting: Internal Medicine

## 2016-06-06 ENCOUNTER — Encounter (HOSPITAL_COMMUNITY): Payer: Self-pay

## 2016-06-06 ENCOUNTER — Other Ambulatory Visit: Payer: Self-pay

## 2016-06-06 DIAGNOSIS — Z01818 Encounter for other preprocedural examination: Secondary | ICD-10-CM | POA: Diagnosis not present

## 2016-06-06 LAB — CBC WITH DIFFERENTIAL/PLATELET
BASOS ABS: 0 10*3/uL (ref 0.0–0.1)
Basophils Relative: 0 %
EOS PCT: 4 %
Eosinophils Absolute: 0.3 10*3/uL (ref 0.0–0.7)
HEMATOCRIT: 41.2 % (ref 36.0–46.0)
Hemoglobin: 13.9 g/dL (ref 12.0–15.0)
LYMPHS ABS: 1.8 10*3/uL (ref 0.7–4.0)
LYMPHS PCT: 29 %
MCH: 30 pg (ref 26.0–34.0)
MCHC: 33.7 g/dL (ref 30.0–36.0)
MCV: 88.8 fL (ref 78.0–100.0)
MONO ABS: 0.5 10*3/uL (ref 0.1–1.0)
Monocytes Relative: 8 %
NEUTROS ABS: 3.6 10*3/uL (ref 1.7–7.7)
Neutrophils Relative %: 59 %
PLATELETS: 310 10*3/uL (ref 150–400)
RBC: 4.64 MIL/uL (ref 3.87–5.11)
RDW: 13 % (ref 11.5–15.5)
WBC: 6.1 10*3/uL (ref 4.0–10.5)

## 2016-06-06 LAB — BASIC METABOLIC PANEL
ANION GAP: 7 (ref 5–15)
BUN: 17 mg/dL (ref 6–20)
CO2: 25 mmol/L (ref 22–32)
Calcium: 9.5 mg/dL (ref 8.9–10.3)
Chloride: 103 mmol/L (ref 101–111)
Creatinine, Ser: 0.68 mg/dL (ref 0.44–1.00)
GFR calc Af Amer: >60 mL/min (ref >60)
GLUCOSE: 87 mg/dL (ref 65–99)
POTASSIUM: 4.3 mmol/L (ref 3.5–5.1)
Sodium: 135 mmol/L (ref 135–145)

## 2016-06-11 ENCOUNTER — Ambulatory Visit (HOSPITAL_COMMUNITY): Payer: Medicare Other | Admitting: Anesthesiology

## 2016-06-11 ENCOUNTER — Ambulatory Visit (HOSPITAL_COMMUNITY)
Admission: RE | Admit: 2016-06-11 | Discharge: 2016-06-11 | Disposition: A | Discharge status: Home / Self Care | Payer: Medicare Other | Source: Ambulatory Visit | Attending: Internal Medicine | Admitting: Internal Medicine

## 2016-06-11 ENCOUNTER — Encounter (HOSPITAL_COMMUNITY)
Admission: RE | Disposition: A | Discharge status: Home / Self Care | Payer: Self-pay | Source: Ambulatory Visit | Attending: Internal Medicine

## 2016-06-11 DIAGNOSIS — F319 Bipolar disorder, unspecified: Secondary | ICD-10-CM | POA: Insufficient documentation

## 2016-06-11 DIAGNOSIS — R197 Diarrhea, unspecified: Secondary | ICD-10-CM | POA: Diagnosis not present

## 2016-06-11 DIAGNOSIS — D122 Benign neoplasm of ascending colon: Secondary | ICD-10-CM | POA: Diagnosis not present

## 2016-06-11 DIAGNOSIS — D123 Benign neoplasm of transverse colon: Secondary | ICD-10-CM | POA: Diagnosis not present

## 2016-06-11 DIAGNOSIS — Z79899 Other long term (current) drug therapy: Secondary | ICD-10-CM | POA: Insufficient documentation

## 2016-06-11 DIAGNOSIS — K635 Polyp of colon: Secondary | ICD-10-CM | POA: Diagnosis not present

## 2016-06-11 DIAGNOSIS — E039 Hypothyroidism, unspecified: Secondary | ICD-10-CM | POA: Insufficient documentation

## 2016-06-11 DIAGNOSIS — K529 Noninfective gastroenteritis and colitis, unspecified: Secondary | ICD-10-CM

## 2016-06-11 DIAGNOSIS — Z86010 Personal history of colonic polyps: Secondary | ICD-10-CM | POA: Diagnosis not present

## 2016-06-11 DIAGNOSIS — Z6834 Body mass index (BMI) 34.0-34.9, adult: Secondary | ICD-10-CM | POA: Insufficient documentation

## 2016-06-11 HISTORY — PX: COLONOSCOPY WITH PROPOFOL: SHX5780

## 2016-06-11 SURGERY — COLONOSCOPY WITH PROPOFOL
Anesthesia: Monitor Anesthesia Care

## 2016-06-11 MED ORDER — PROPOFOL 500 MG/50ML IV EMUL
INTRAVENOUS | Status: DC | PRN
  Administered 2016-06-11: 125 ug/kg/min via INTRAVENOUS

## 2016-06-11 MED ORDER — FENTANYL CITRATE (PF) 100 MCG/2ML IJ SOLN
INTRAMUSCULAR | Status: AC
  Filled 2016-06-11: qty 2

## 2016-06-11 MED ORDER — FENTANYL CITRATE (PF) 100 MCG/2ML IJ SOLN
25.0000 ug | INTRAMUSCULAR | Status: AC | PRN
  Administered 2016-06-11 (×2): 25 ug via INTRAVENOUS

## 2016-06-11 MED ORDER — MIDAZOLAM HCL 5 MG/5ML IJ SOLN
INTRAMUSCULAR | Status: DC | PRN
  Administered 2016-06-11: 2 mg via INTRAVENOUS

## 2016-06-11 MED ORDER — MIDAZOLAM HCL 2 MG/2ML IJ SOLN
INTRAMUSCULAR | Status: AC
  Filled 2016-06-11: qty 2

## 2016-06-11 MED ORDER — LACTATED RINGERS IV SOLN
INTRAVENOUS | Status: DC
  Administered 2016-06-11: 10:00:00 via INTRAVENOUS

## 2016-06-11 MED ORDER — CHLORHEXIDINE GLUCONATE CLOTH 2 % EX PADS: 6.0000 | MEDICATED_PAD | Freq: Once | CUTANEOUS | Status: DC

## 2016-06-11 MED ORDER — MIDAZOLAM HCL 2 MG/2ML IJ SOLN
1.0000 mg | INTRAMUSCULAR | Status: DC | PRN
  Administered 2016-06-11 (×2): 2 mg via INTRAVENOUS
  Filled 2016-06-11: qty 2

## 2016-06-11 MED ORDER — HYDROMORPHONE HCL 1 MG/ML IJ SOLN: 0.2500 mg | INTRAMUSCULAR | Status: DC | PRN
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
## 2016-06-11 NOTE — Anesthesia Postprocedure Evaluation (Signed)
Anesthesia Post Note  Patient: Annette Johnston  Procedure(s) Performed: Procedure(s) (LRB): COLONOSCOPY WITH PROPOFOL (N/A)  Patient location during evaluation: PACU Anesthesia Type: MAC Level of consciousness: awake and alert and oriented Pain management: pain level controlled Vital Signs Assessment: post-procedure vital signs reviewed and stable Respiratory status: spontaneous breathing Cardiovascular status: stable Postop Assessment: no signs of nausea or vomiting Anesthetic complications: no    Last Vitals:  Vitals:   06/11/16 1040 06/11/16 1140  BP: (!) 141/79 132/76  Pulse:  72  Resp: 14 18  Temp:  (P) 36.5 C    Last Pain:  Vitals:   06/11/16 0939  TempSrc: Oral                 Tarrance Januszewski

## 2016-06-11 NOTE — Anesthesia Preprocedure Evaluation (Signed)
Anesthesia Evaluation  Patient identified by MRN, date of birth, ID band Patient awake    Reviewed: Allergy & Precautions, NPO status , Patient's Chart, lab work & pertinent test results  Airway Mallampati: II  TM Distance: >3 FB     Dental  (+) Teeth Intact   Pulmonary shortness of breath and with exertion,    Pulmonary exam normal        Cardiovascular + DOE  negative cardio ROS   Rhythm:Regular Rate:Normal     Neuro/Psych PSYCHIATRIC DISORDERS Bipolar Disorder    GI/Hepatic negative GI ROS,   Endo/Other  Hypothyroidism Morbid obesity  Renal/GU      Musculoskeletal   Abdominal   Peds  Hematology   Anesthesia Other Findings   Reproductive/Obstetrics                             Anesthesia Physical Anesthesia Plan  ASA: II  Anesthesia Plan: MAC   Post-op Pain Management:    Induction: Intravenous  Airway Management Planned: Simple Face Mask  Additional Equipment:   Intra-op Plan:   Post-operative Plan:   Informed Consent: I have reviewed the patients History and Physical, chart, labs and discussed the procedure including the risks, benefits and alternatives for the proposed anesthesia with the patient or authorized representative who has indicated his/her understanding and acceptance.     Plan Discussed with:   Anesthesia Plan Comments:         Anesthesia Quick Evaluation

## 2016-06-11 NOTE — Op Note (Signed)
NAME:  CLARIA, GIARRUSSO NO.:  [id number]  MEDICAL RECORD NO.:  [id number]  LOCATION:  [facility]                          FACILITY:  [facility]  PHYSICIAN:  R. Garfield Cornea, MD [place]:  [birth year]  DATE OF PROCEDURE:  06/11/2016 DATE OF DISCHARGE:                              OPERATIVE REPORT   PROCEDURE:  Ileo-colonoscopy with snare polypectomy and segmental biopsy.  INDICATIONS FOR PROCEDURE:  A 71 year old lady with a history of colonic adenomas being now evaluated for chronic diarrhea.  Colonoscopy now being done.  Risks, benefits, limitations, alternatives, and imponderables have been discussed.  Questions were answered.  Please see documentation and medical record.  PROCEDURE NOTE:  O2 saturation, blood pressure, pulse, respirations monitored throughout the entire procedure.  Deep sedation induced by Dr. Duwayne Heck and associates.  INSTRUMENTATION:  Pentax video chip system.  FINDINGS:  Digital rectal exam revealed no abnormalities.  Endoscopic findings, prep was adequate.  Examination of the rectal mucosa including retroflexed view of the anal verge demonstrated no abnormalities.  Colon:  The colonic mucosa was surveyed from the rectosigmoid junction through the left transverse right colon to the appendiceal orifice, ileocecal valve, and cecum.  These structures well seen photographed for the record.  The distal 5 cm of the terminal ileum mucosa was also was inspected from this level, the scope was slowly withdrawn.  All previously mentioned mucosal surfaces were again seen.  The terminal ileum appeared normal.  There was a single 7 mm pedunculated polyp in the mid ascending segment, which was cold snare removed and recovered. The remainder of the colonic mucosa appeared normal.  Segmental biopsies of the ascending, descending/sigmoid segments were taken to evaluate for microscopic colitis.  The patient tolerated the procedure well and was taken  to PACU.  SERVICE TIME:  18 minutes.  IMPRESSION: 1. Colonic polyp-removed as described above.  Status post segmental     biopsy.  Recommendations follow up on path pathology. 2. Further recommendations to follow.     Bridgette Habermann, MD [facility] Ore     RMR/MEDQ  D:  06/11/2016  T:  06/11/2016  Job:  [id number]  cc:   Consuello Masse, MD Fax: [phone]

## 2016-06-11 NOTE — Discharge Instructions (Addendum)
Colonoscopy Discharge Instructions  Read the instructions outlined below and refer to this sheet in the next few weeks. These discharge instructions provide you with general information on caring for yourself after you leave the hospital. Your doctor may also give you specific instructions. While your treatment has been planned according to the most current medical practices available, unavoidable complications occasionally occur. If you have any problems or questions after discharge, call Dr. Gala Romney at [phone]. ACTIVITY  You may resume your regular activity, but move at a slower pace for the next 24 hours.   Take frequent rest periods for the next 24 hours.   Walking will help get rid of the air and reduce the bloated feeling in your belly (abdomen).   No driving for 24 hours (because of the medicine (anesthesia) used during the test).    Do not sign any important legal documents or operate any machinery for 24 hours (because of the anesthesia used during the test).  NUTRITION  Drink plenty of fluids.   You may resume your normal diet as instructed by your doctor.   Begin with a light meal and progress to your normal diet. Heavy or fried foods are harder to digest and may make you feel sick to your stomach (nauseated).   Avoid alcoholic beverages for 24 hours or as instructed.  MEDICATIONS  You may resume your normal medications unless your doctor tells you otherwise.  WHAT YOU CAN EXPECT TODAY  Some feelings of bloating in the abdomen.   Passage of more gas than usual.   Spotting of blood in your stool or on the toilet paper.  IF YOU HAD POLYPS REMOVED DURING THE COLONOSCOPY:  No aspirin products for 7 days or as instructed.   No alcohol for 7 days or as instructed.   Eat a soft diet for the next 24 hours.  FINDING OUT THE RESULTS OF YOUR TEST Not all test results are available during your visit. If your test results are not back during the visit, make an appointment  with your caregiver to find out the results. Do not assume everything is normal if you have not heard from your caregiver or the medical facility. It is important for you to follow up on all of your test results.  SEEK IMMEDIATE MEDICAL ATTENTION IF:  You have more than a spotting of blood in your stool.   Your belly is swollen (abdominal distention).   You are nauseated or vomiting.   You have a temperature over 101.   You have abdominal pain or discomfort that is severe or gets worse throughout the day.     Colon polyp information provided  Further recommendations to follow pending review of pathology report  Colon Polyps Polyps are lumps of extra tissue growing inside the body. Polyps can grow in the large intestine (colon). Most colon polyps are noncancerous (benign). However, some colon polyps can become cancerous over time. Polyps that are larger than a pea may be harmful. To be safe, caregivers remove and test all polyps. CAUSES  Polyps form when mutations in the genes cause your cells to grow and divide even though no more tissue is needed. RISK FACTORS There are a number of risk factors that can increase your chances of getting colon polyps. They include:  Being older than 50 years.  Family history of colon polyps or colon cancer.  Long-term colon diseases, such as colitis or Crohn disease.  Being overweight.  Smoking.  Being inactive.  Drinking too much  alcohol. SYMPTOMS  Most small polyps do not cause symptoms. If symptoms are present, they may include:  Blood in the stool. The stool may look dark red or black.  Constipation or diarrhea that lasts longer than 1 week. DIAGNOSIS People often do not know they have polyps until their caregiver finds them during a regular checkup. Your caregiver can use 4 tests to check for polyps:  Digital rectal exam. The caregiver wears gloves and feels inside the rectum. This test would find polyps only in the  rectum.  Barium enema. The caregiver puts a liquid called barium into your rectum before taking X-rays of your colon. Barium makes your colon look white. Polyps are dark, so they are easy to see in the X-ray pictures.  Sigmoidoscopy. A thin, flexible tube (sigmoidoscope) is placed into your rectum. The sigmoidoscope has a light and tiny camera in it. The caregiver uses the sigmoidoscope to look at the last third of your colon.  Colonoscopy. This test is like sigmoidoscopy, but the caregiver looks at the entire colon. This is the most common method for finding and removing polyps. TREATMENT  Any polyps will be removed during a sigmoidoscopy or colonoscopy. The polyps are then tested for cancer. PREVENTION  To help lower your risk of getting more colon polyps:  Eat plenty of fruits and vegetables. Avoid eating fatty foods.  Do not smoke.  Avoid drinking alcohol.  Exercise every day.  Lose weight if recommended by your caregiver.  Eat plenty of calcium and folate. Foods that are rich in calcium include milk, cheese, and broccoli. Foods that are rich in folate include chickpeas, kidney beans, and spinach. HOME CARE INSTRUCTIONS Keep all follow-up appointments as directed by your caregiver. You may need periodic exams to check for polyps. SEEK MEDICAL CARE IF: You notice bleeding during a bowel movement.   This information is not intended to replace advice given to you by your health care provider. Make sure you discuss any questions you have with your health care provider.   Document Released: 04/25/2004 Document Revised: 08/20/2014 Document Reviewed: 10/09/2011 Elsevier Interactive Patient Education 2016 [employer]  PATIENT INSTRUCTIONS POST-ANESTHESIA  IMMEDIATELY FOLLOWING SURGERY:  Do not drive or operate machinery for the first twenty four hours after surgery.  Do not make any important decisions for twenty four hours after surgery or while taking narcotic pain medications or  sedatives.  If you develop intractable nausea and vomiting or a severe headache please notify your doctor immediately.  FOLLOW-UP:  Please make an appointment with your surgeon as instructed. You do not need to follow up with anesthesia unless specifically instructed to do so.  WOUND CARE INSTRUCTIONS (if applicable):  Keep a dry clean dressing on the anesthesia/puncture wound site if there is drainage.  Once the wound has quit draining you may leave it open to air.  Generally you should leave the bandage intact for twenty four hours unless there is drainage.  If the epidural site drains for more than 36-48 hours please call the anesthesia department.  QUESTIONS?:  Please feel free to call your physician or the hospital operator if you have any questions, and they will be happy to assist you.

## 2016-06-11 NOTE — Interval H&P Note (Signed)
History and Physical Interval Note:  06/11/2016 9:55 AM  [name]  has presented today for surgery, with the diagnosis of chronic diarrhea  The various methods of treatment have been discussed with the patient and family. After consideration of risks, benefits and other options for treatment, the patient has consented to  Procedure(s) with comments: COLONOSCOPY WITH PROPOFOL (N/A) - 10:30 am as a surgical intervention .  The patient's history has been reviewed, patient examined, no change in status, stable for surgery.  I have reviewed the patient's chart and labs.  Questions were answered to the patient's satisfaction.     Samanvitha Germany  No change. Diagnostic colonoscopy for chronic diarrhea per plan.  The risks, benefits, limitations, alternatives and imponderables have been reviewed with the patient. Questions have been answered. All parties are agreeable.

## 2016-06-11 NOTE — Addendum Note (Signed)
Addendum  created 06/11/16 1202 by Ollen Bowl, CRNA   Charge Capture section accepted

## 2016-06-11 NOTE — Transfer of Care (Signed)
Immediate Anesthesia Transfer of Care Note  Patient: Annette Johnston  Procedure(s) Performed: Procedure(s) with comments: COLONOSCOPY WITH PROPOFOL (N/A) - 10:30 am  Patient Location: PACU  Anesthesia Type:MAC  Level of Consciousness: awake  Airway & Oxygen Therapy: Patient Spontanous Breathing and Patient connected to nasal cannula oxygen  Post-op Assessment: Report given to RN  Post vital signs: Reviewed and stable  Last Vitals:  Vitals:   06/11/16 1030 06/11/16 1040  BP: 133/81 (!) 141/79  Pulse:    Resp: 18 14  Temp:      Last Pain:  Vitals:   06/11/16 0939  TempSrc: Oral      Patients Stated Pain Goal: 7 ([id number])  Complications: No apparent anesthesia complications

## 2016-06-12 ENCOUNTER — Encounter: Payer: Self-pay | Admitting: Internal Medicine

## 2016-06-13 ENCOUNTER — Encounter (HOSPITAL_COMMUNITY): Payer: Self-pay | Admitting: Internal Medicine

## 2016-06-13 ENCOUNTER — Telehealth: Payer: Self-pay

## 2016-06-13 ENCOUNTER — Encounter: Payer: Self-pay | Admitting: Internal Medicine

## 2016-06-13 NOTE — Telephone Encounter (Signed)
Letter mailed to the pt. 

## 2016-06-13 NOTE — Telephone Encounter (Signed)
OV made and letter mailed °

## 2016-06-13 NOTE — Telephone Encounter (Signed)
Per RMR- Send letter to patient.  Send copy of letter with path to referring provider and PCP.   Patient needs an office visit with extender within the next month to delve into diarrhea further

## 2016-07-13 ENCOUNTER — Ambulatory Visit (INDEPENDENT_AMBULATORY_CARE_PROVIDER_SITE_OTHER): Payer: Medicare Other | Admitting: Gastroenterology

## 2016-07-13 ENCOUNTER — Encounter: Payer: Self-pay | Admitting: Gastroenterology

## 2016-07-13 VITALS — BP 128/82 | HR 121 | Temp 98.1°F | Ht 67.0 in | Wt 220.8 lb

## 2016-07-13 DIAGNOSIS — R197 Diarrhea, unspecified: Secondary | ICD-10-CM

## 2016-07-13 MED ORDER — DICYCLOMINE HCL 10 MG PO CAPS
10.0000 mg | ORAL_CAPSULE | Freq: Three times a day (TID) | ORAL | 3 refills | Status: DC
Start: 2016-07-13 — End: 2016-11-24

## 2016-07-13 NOTE — Assessment & Plan Note (Signed)
70 year old female with chronic diarrhea, bouts of incontinence, no prescriptive agents tried per patient. Recent colonoscopy on file unrevealing. No alarm features, weight stable. Will trial Bentyl. I discussed Viberzi with her as well, as she would be a candidate for this. Other options could include low-dose Questran, although I would like to avoid this due to timing of taking other medications. Will have her follow-up in 4-6 weeks.
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
## 2016-07-13 NOTE — Progress Notes (Signed)
cc'ed to pcp °

## 2016-07-13 NOTE — Patient Instructions (Signed)
Let's try taking Bentyl before breakfast and dinner. You can take this up to 4 times a day. Monitor for dry mouth, constipation, drowsiness, dizziness.   I would like to see you in 4-6 weeks.

## 2016-07-16 DIAGNOSIS — D485 Neoplasm of uncertain behavior of skin: Secondary | ICD-10-CM | POA: Diagnosis not present

## 2016-07-16 DIAGNOSIS — D0461 Carcinoma in situ of skin of right upper limb, including shoulder: Secondary | ICD-10-CM | POA: Diagnosis not present

## 2016-07-16 DIAGNOSIS — R233 Spontaneous ecchymoses: Secondary | ICD-10-CM | POA: Diagnosis not present

## 2016-07-26 DIAGNOSIS — J209 Acute bronchitis, unspecified: Secondary | ICD-10-CM | POA: Diagnosis not present

## 2016-07-26 DIAGNOSIS — R05 Cough: Secondary | ICD-10-CM | POA: Diagnosis not present

## 2016-07-26 DIAGNOSIS — Z6834 Body mass index (BMI) 34.0-34.9, adult: Secondary | ICD-10-CM | POA: Diagnosis not present

## 2016-07-30 DIAGNOSIS — Z6833 Body mass index (BMI) 33.0-33.9, adult: Secondary | ICD-10-CM | POA: Diagnosis not present

## 2016-07-30 DIAGNOSIS — J209 Acute bronchitis, unspecified: Secondary | ICD-10-CM | POA: Diagnosis not present

## 2016-07-30 DIAGNOSIS — R0602 Shortness of breath: Secondary | ICD-10-CM | POA: Diagnosis not present

## 2016-07-30 DIAGNOSIS — R062 Wheezing: Secondary | ICD-10-CM | POA: Diagnosis not present

## 2016-08-12 DIAGNOSIS — R102 Pelvic and perineal pain: Secondary | ICD-10-CM | POA: Diagnosis not present

## 2016-08-12 DIAGNOSIS — S76012A Strain of muscle, fascia and tendon of left hip, initial encounter: Secondary | ICD-10-CM | POA: Diagnosis not present

## 2016-08-12 DIAGNOSIS — S3993XA Unspecified injury of pelvis, initial encounter: Secondary | ICD-10-CM | POA: Diagnosis not present

## 2016-08-12 DIAGNOSIS — W1839XA Other fall on same level, initial encounter: Secondary | ICD-10-CM | POA: Diagnosis not present

## 2016-08-16 DIAGNOSIS — C44622 Squamous cell carcinoma of skin of right upper limb, including shoulder: Secondary | ICD-10-CM | POA: Diagnosis not present

## 2016-08-16 DIAGNOSIS — L089 Local infection of the skin and subcutaneous tissue, unspecified: Secondary | ICD-10-CM | POA: Diagnosis not present

## 2016-08-20 DIAGNOSIS — Z9181 History of falling: Secondary | ICD-10-CM | POA: Diagnosis not present

## 2016-08-20 DIAGNOSIS — S76012A Strain of muscle, fascia and tendon of left hip, initial encounter: Secondary | ICD-10-CM | POA: Diagnosis not present

## 2016-08-21 ENCOUNTER — Ambulatory Visit: Payer: Medicare Other | Admitting: Gastroenterology

## 2016-08-30 DIAGNOSIS — Z4802 Encounter for removal of sutures: Secondary | ICD-10-CM | POA: Diagnosis not present

## 2016-09-06 DIAGNOSIS — F3173 Bipolar disorder, in partial remission, most recent episode manic: Secondary | ICD-10-CM | POA: Diagnosis not present

## 2016-09-25 DIAGNOSIS — R2689 Other abnormalities of gait and mobility: Secondary | ICD-10-CM | POA: Diagnosis not present

## 2016-09-25 DIAGNOSIS — Z1389 Encounter for screening for other disorder: Secondary | ICD-10-CM | POA: Diagnosis not present

## 2016-09-25 DIAGNOSIS — G3184 Mild cognitive impairment, so stated: Secondary | ICD-10-CM | POA: Diagnosis not present

## 2016-09-25 DIAGNOSIS — Z6834 Body mass index (BMI) 34.0-34.9, adult: Secondary | ICD-10-CM | POA: Diagnosis not present

## 2016-09-25 DIAGNOSIS — E039 Hypothyroidism, unspecified: Secondary | ICD-10-CM | POA: Diagnosis not present

## 2016-09-25 DIAGNOSIS — R0602 Shortness of breath: Secondary | ICD-10-CM | POA: Diagnosis not present

## 2016-09-25 DIAGNOSIS — F411 Generalized anxiety disorder: Secondary | ICD-10-CM | POA: Diagnosis not present

## 2016-10-03 DIAGNOSIS — J209 Acute bronchitis, unspecified: Secondary | ICD-10-CM | POA: Diagnosis not present

## 2016-10-03 DIAGNOSIS — Z6834 Body mass index (BMI) 34.0-34.9, adult: Secondary | ICD-10-CM | POA: Diagnosis not present

## 2016-11-08 DIAGNOSIS — L570 Actinic keratosis: Secondary | ICD-10-CM | POA: Diagnosis not present

## 2016-11-08 DIAGNOSIS — Z85828 Personal history of other malignant neoplasm of skin: Secondary | ICD-10-CM | POA: Diagnosis not present

## 2016-11-24 ENCOUNTER — Other Ambulatory Visit: Payer: Self-pay | Admitting: Gastroenterology

## 2017-01-09 DIAGNOSIS — M75101 Unspecified rotator cuff tear or rupture of right shoulder, not specified as traumatic: Secondary | ICD-10-CM | POA: Diagnosis not present

## 2017-01-09 DIAGNOSIS — M25511 Pain in right shoulder: Secondary | ICD-10-CM | POA: Diagnosis not present

## 2017-01-17 DIAGNOSIS — M25511 Pain in right shoulder: Secondary | ICD-10-CM | POA: Diagnosis not present

## 2017-01-17 DIAGNOSIS — M19011 Primary osteoarthritis, right shoulder: Secondary | ICD-10-CM | POA: Diagnosis not present

## 2017-01-23 DIAGNOSIS — M75101 Unspecified rotator cuff tear or rupture of right shoulder, not specified as traumatic: Secondary | ICD-10-CM | POA: Diagnosis not present

## 2017-01-23 DIAGNOSIS — M25511 Pain in right shoulder: Secondary | ICD-10-CM | POA: Diagnosis not present

## 2017-02-20 DIAGNOSIS — M25511 Pain in right shoulder: Secondary | ICD-10-CM | POA: Diagnosis not present

## 2017-02-20 DIAGNOSIS — M75101 Unspecified rotator cuff tear or rupture of right shoulder, not specified as traumatic: Secondary | ICD-10-CM | POA: Diagnosis not present

## 2017-03-30 ENCOUNTER — Other Ambulatory Visit: Payer: Self-pay | Admitting: Nurse Practitioner

## 2017-05-22 DIAGNOSIS — F3173 Bipolar disorder, in partial remission, most recent episode manic: Secondary | ICD-10-CM | POA: Diagnosis not present

## 2017-06-11 ENCOUNTER — Other Ambulatory Visit: Payer: Self-pay

## 2017-06-14 MED ORDER — DICYCLOMINE HCL 10 MG PO CAPS: 10.0000 mg | ORAL_CAPSULE | Freq: Three times a day (TID) | ORAL | 1 refills | Status: DC

## 2017-06-18 ENCOUNTER — Telehealth: Payer: Self-pay

## 2017-06-18 MED ORDER — HYOSCYAMINE SULFATE ER 0.375 MG PO TB12: 0.3750 mg | ORAL_TABLET | Freq: Two times a day (BID) | ORAL | 1 refills | Status: DC

## 2017-06-18 NOTE — Telephone Encounter (Signed)
RX for [employer] sent. Patient needs ov.

## 2017-06-18 NOTE — Telephone Encounter (Signed)
Received fax from [facility]. Dicyclomine 10mg  capsules is currently on manufacturer backorder. Pharmacy is requesting something else.

## 2017-06-18 NOTE — Telephone Encounter (Signed)
Called and informed pt. She said that she has plenty Dicyclomine for now. She is going out of town and will call later to schedule OV.

## 2017-11-06 DIAGNOSIS — F3173 Bipolar disorder, in partial remission, most recent episode manic: Secondary | ICD-10-CM | POA: Diagnosis not present

## 2017-11-11 DIAGNOSIS — R928 Other abnormal and inconclusive findings on diagnostic imaging of breast: Secondary | ICD-10-CM | POA: Diagnosis not present

## 2017-11-11 DIAGNOSIS — Z9882 Breast implant status: Secondary | ICD-10-CM | POA: Diagnosis not present

## 2017-11-11 DIAGNOSIS — Z1231 Encounter for screening mammogram for malignant neoplasm of breast: Secondary | ICD-10-CM | POA: Diagnosis not present

## 2017-11-13 DIAGNOSIS — R928 Other abnormal and inconclusive findings on diagnostic imaging of breast: Secondary | ICD-10-CM | POA: Diagnosis not present

## 2017-11-13 DIAGNOSIS — N6489 Other specified disorders of breast: Secondary | ICD-10-CM | POA: Diagnosis not present

## 2017-11-19 DIAGNOSIS — F3173 Bipolar disorder, in partial remission, most recent episode manic: Secondary | ICD-10-CM | POA: Diagnosis not present

## 2017-12-06 DIAGNOSIS — L500 Allergic urticaria: Secondary | ICD-10-CM | POA: Diagnosis not present

## 2017-12-06 DIAGNOSIS — Z6835 Body mass index (BMI) 35.0-35.9, adult: Secondary | ICD-10-CM | POA: Diagnosis not present

## 2017-12-08 DIAGNOSIS — L299 Pruritus, unspecified: Secondary | ICD-10-CM | POA: Diagnosis not present

## 2017-12-08 DIAGNOSIS — Z791 Long term (current) use of non-steroidal anti-inflammatories (NSAID): Secondary | ICD-10-CM | POA: Diagnosis not present

## 2017-12-08 DIAGNOSIS — R21 Rash and other nonspecific skin eruption: Secondary | ICD-10-CM | POA: Diagnosis not present

## 2017-12-08 DIAGNOSIS — F319 Bipolar disorder, unspecified: Secondary | ICD-10-CM | POA: Diagnosis not present

## 2017-12-08 DIAGNOSIS — T7840XA Allergy, unspecified, initial encounter: Secondary | ICD-10-CM | POA: Diagnosis not present

## 2017-12-08 DIAGNOSIS — Z79899 Other long term (current) drug therapy: Secondary | ICD-10-CM | POA: Diagnosis not present

## 2017-12-13 DIAGNOSIS — F3173 Bipolar disorder, in partial remission, most recent episode manic: Secondary | ICD-10-CM | POA: Diagnosis not present

## 2017-12-17 ENCOUNTER — Encounter: Payer: Self-pay | Admitting: Allergy & Immunology

## 2017-12-17 ENCOUNTER — Ambulatory Visit (INDEPENDENT_AMBULATORY_CARE_PROVIDER_SITE_OTHER): Payer: Medicare Other | Admitting: Allergy & Immunology

## 2017-12-17 VITALS — BP 110/78 | HR 68 | Temp 98.1°F | Resp 18 | Ht 66.0 in | Wt 227.0 lb

## 2017-12-17 DIAGNOSIS — T7840XD Allergy, unspecified, subsequent encounter: Secondary | ICD-10-CM | POA: Diagnosis not present

## 2017-12-17 MED ORDER — EPINEPHRINE 0.3 MG/0.3ML IJ SOAJ: 0.3000 mg | Freq: Once | INTRAMUSCULAR | 2 refills | Status: AC
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
## 2017-12-17 NOTE — Patient Instructions (Addendum)
1. Allergic reaction - unknown trigger - Testing today was negative to the entire environmental allergy panel. - Testing was negative to the most common foods (peanut, sesame, cashew, soy, fish mix, shellfish mix, wheat, milk, casein, egg) - There is a the low positive predictive value of food allergy testing and hence the high possibility of false positives. - In contrast, food allergy testing has a high negative predictive value, therefore if testing is negative we can be relatively assured that they are indeed negative.  - We will get some lab work to rule out serious causes of anaphylaxis (serious allergic reactions): serum tryptase (to rule out mast cell disease), alpha-gal panel (to rule out red meat allergy), and a complete blood count and complete metabolic panel.  - We will send in a prescription for epinephrine (EpiPen teaching) just in case the next reaction is even worse. - We will call you in 1-2 weeks the results.   2. Return in about 6 months (around 06/19/2018).   Please inform us of any Emergency Department visits, hospitalizations, or changes in symptoms. Call us before going to the ED for breathing or allergy symptoms since we might be able to fit you in for a sick visit. Feel free to contact us anytime with any questions, problems, or concerns.  It was a pleasure to meet you today!  Websites that have reliable patient information: 1. American Academy of Asthma, Allergy, and Immunology: www.aaaai.org 2. Food Allergy Research and Education (FARE): foodallergy.org 3. Mothers of Asthmatics: http://www.asthmacommunitynetwork.org 4. American College of Allergy, Asthma, and Immunology: www.acaai.org

## 2017-12-24 LAB — IGE+ALLERGENS ZONE 2(30)
Bahia Grass IgE: <0.1 kU/L
Bermuda Grass IgE: <0.1 kU/L
Cockroach, American IgE: <0.1 kU/L
Common Silver Birch IgE: <0.1 kU/L
Dog Dander IgE: <0.1 kU/L
IGE (IMMUNOGLOBULIN E), SERUM: 156 [IU]/mL (ref 6–495)
Johnson Grass IgE: <0.1 kU/L
Maple/Box Elder IgE: <0.1 kU/L
Mucor Racemosus IgE: <0.1 kU/L
Mugwort IgE Qn: <0.1 kU/L
Oak, White IgE: <0.1 kU/L
Penicillium Chrysogen IgE: <0.1 kU/L
Plantain, English IgE: <0.1 kU/L
Sheep Sorrel IgE Qn: <0.1 kU/L
Sweet gum IgE RAST Ql: <0.1 kU/L
Timothy Grass IgE: <0.1 kU/L
White Mulberry IgE: <0.1 kU/L

## 2017-12-24 LAB — CBC WITH DIFFERENTIAL/PLATELET
BASOS ABS: 0 10*3/uL (ref 0.0–0.2)
Basos: 0 %
EOS (ABSOLUTE): 0.3 10*3/uL (ref 0.0–0.4)
Eos: 5 %
Hematocrit: 38.2 % (ref 34.0–46.6)
Hemoglobin: 13.1 g/dL (ref 11.1–15.9)
Immature Grans (Abs): 0 10*3/uL (ref 0.0–0.1)
Immature Granulocytes: 0 %
LYMPHS ABS: 1.6 10*3/uL (ref 0.7–3.1)
Lymphs: 24 %
MCH: 30.7 pg (ref 26.6–33.0)
MCHC: 34.3 g/dL (ref 31.5–35.7)
MCV: 90 fL (ref 79–97)
MONOS ABS: 0.6 10*3/uL (ref 0.1–0.9)
Monocytes: 9 %
NEUTROS ABS: 4.1 10*3/uL (ref 1.4–7.0)
Neutrophils: 62 %
PLATELETS: 329 10*3/uL (ref 150–379)
RBC: 4.27 x10E6/uL (ref 3.77–5.28)
RDW: 14 % (ref 12.3–15.4)
WBC: 6.7 10*3/uL (ref 3.4–10.8)

## 2017-12-24 LAB — COMPREHENSIVE METABOLIC PANEL
A/G RATIO: 1.4 (ref 1.2–2.2)
ALK PHOS: 77 IU/L (ref 39–117)
ALT: 26 IU/L (ref 0–32)
AST: 30 IU/L (ref 0–40)
Albumin: 4.2 g/dL (ref 3.5–4.8)
BUN/Creatinine Ratio: 23 (ref 12–28)
BUN: 15 mg/dL (ref 8–27)
Bilirubin Total: <0.2 mg/dL (ref 0.0–1.2)
CHLORIDE: 98 mmol/L (ref 96–106)
CO2: 23 mmol/L (ref 20–29)
Calcium: 9.4 mg/dL (ref 8.7–10.3)
Creatinine, Ser: 0.64 mg/dL (ref 0.57–1.00)
GFR calc non Af Amer: 90 mL/min/{1.73_m2} (ref >59)
GFR, EST AFRICAN AMERICAN: 104 mL/min/{1.73_m2} (ref >59)
GLUCOSE: 89 mg/dL (ref 65–99)
Globulin, Total: 3.1 g/dL (ref 1.5–4.5)
Potassium: 4.8 mmol/L (ref 3.5–5.2)
Sodium: 137 mmol/L (ref 134–144)
Total Protein: 7.3 g/dL (ref 6.0–8.5)

## 2017-12-24 LAB — ALPHA-GAL PANEL
Alpha Gal IgE*: <0.1 kU/L (ref <0.10)
BEEF CLASS INTERPRETATION: 0
Class Interpretation: 0
LAMB CLASS INTERPRETATION: 0
Lamb/Mutton (Ovis spp) IgE: <0.1 kU/L (ref <0.35)

## 2017-12-24 LAB — TRYPTASE: TRYPTASE: 3.2 ug/L (ref 2.2–13.2)

## 2017-12-30 DIAGNOSIS — F3173 Bipolar disorder, in partial remission, most recent episode manic: Secondary | ICD-10-CM | POA: Diagnosis not present

## 2018-01-09 DIAGNOSIS — R202 Paresthesia of skin: Secondary | ICD-10-CM | POA: Diagnosis not present

## 2018-01-09 DIAGNOSIS — R5383 Other fatigue: Secondary | ICD-10-CM | POA: Diagnosis not present

## 2018-01-09 DIAGNOSIS — Z6835 Body mass index (BMI) 35.0-35.9, adult: Secondary | ICD-10-CM | POA: Diagnosis not present

## 2018-01-28 ENCOUNTER — Ambulatory Visit: Payer: Self-pay | Admitting: Allergy & Immunology

## 2018-02-06 DIAGNOSIS — F3173 Bipolar disorder, in partial remission, most recent episode manic: Secondary | ICD-10-CM | POA: Diagnosis not present

## 2018-02-28 DIAGNOSIS — F3173 Bipolar disorder, in partial remission, most recent episode manic: Secondary | ICD-10-CM | POA: Diagnosis not present

## 2018-03-05 DIAGNOSIS — F3173 Bipolar disorder, in partial remission, most recent episode manic: Secondary | ICD-10-CM | POA: Diagnosis not present

## 2018-03-19 DIAGNOSIS — F419 Anxiety disorder, unspecified: Secondary | ICD-10-CM | POA: Diagnosis not present

## 2018-03-19 DIAGNOSIS — Z79899 Other long term (current) drug therapy: Secondary | ICD-10-CM | POA: Diagnosis not present

## 2018-03-19 DIAGNOSIS — F319 Bipolar disorder, unspecified: Secondary | ICD-10-CM | POA: Diagnosis not present

## 2018-03-19 DIAGNOSIS — R0602 Shortness of breath: Secondary | ICD-10-CM | POA: Diagnosis not present

## 2018-03-19 DIAGNOSIS — R251 Tremor, unspecified: Secondary | ICD-10-CM | POA: Diagnosis not present

## 2018-03-20 ENCOUNTER — Other Ambulatory Visit: Payer: Self-pay | Admitting: Gastroenterology

## 2018-03-25 DIAGNOSIS — M1712 Unilateral primary osteoarthritis, left knee: Secondary | ICD-10-CM | POA: Diagnosis not present

## 2018-03-31 ENCOUNTER — Encounter: Payer: Self-pay | Admitting: *Deleted

## 2018-04-01 ENCOUNTER — Telehealth: Payer: Self-pay | Admitting: Diagnostic Neuroimaging

## 2018-04-01 ENCOUNTER — Ambulatory Visit (INDEPENDENT_AMBULATORY_CARE_PROVIDER_SITE_OTHER): Payer: Medicare Other | Admitting: Diagnostic Neuroimaging

## 2018-04-01 ENCOUNTER — Encounter: Payer: Self-pay | Admitting: Diagnostic Neuroimaging

## 2018-04-01 VITALS — BP 124/82 | HR 86 | Ht 66.0 in | Wt 214.0 lb

## 2018-04-01 DIAGNOSIS — R251 Tremor, unspecified: Secondary | ICD-10-CM | POA: Diagnosis not present

## 2018-04-01 DIAGNOSIS — G2401 Drug induced subacute dyskinesia: Secondary | ICD-10-CM | POA: Diagnosis not present

## 2018-04-01 DIAGNOSIS — G20 Parkinson's disease: Secondary | ICD-10-CM | POA: Diagnosis not present

## 2018-04-01 DIAGNOSIS — G43909 Migraine, unspecified, not intractable, without status migrainosus: Secondary | ICD-10-CM | POA: Insufficient documentation

## 2018-04-01 NOTE — Telephone Encounter (Signed)
Medicare/aetna order sent to GI. They will reach out to the pt to schedule.
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
## 2018-04-01 NOTE — Patient Instructions (Signed)
-   check MRI brain (rule out stroke)  - check DATscan (eval for drug-induced parkinsonism vs parkinson's disease)  - continue ingrezza

## 2018-04-03 LAB — CERULOPLASMIN: CERULOPLASMIN: 37 mg/dL (ref 19.0–39.0)

## 2018-04-03 LAB — IRON,TIBC AND FERRITIN PANEL
FERRITIN: 126 ng/mL (ref 15–150)
IRON SATURATION: 20 % (ref 15–55)
Iron: 62 ug/dL (ref 27–139)
Total Iron Binding Capacity: 315 ug/dL (ref 250–450)
UIBC: 253 ug/dL (ref 118–369)

## 2018-04-03 LAB — COPPER, SERUM: Copper: 155 ug/dL (ref 72–166)

## 2018-04-04 DIAGNOSIS — F3173 Bipolar disorder, in partial remission, most recent episode manic: Secondary | ICD-10-CM | POA: Diagnosis not present

## 2018-04-07 ENCOUNTER — Telehealth: Payer: Self-pay | Admitting: Diagnostic Neuroimaging

## 2018-04-07 NOTE — Telephone Encounter (Signed)
DAT scan approval processing could take two weeks for approval. Patient is aware . Patient will have DAT scan at [facility] .

## 2018-04-08 ENCOUNTER — Telehealth: Payer: Self-pay | Admitting: Neurology

## 2018-04-08 ENCOUNTER — Encounter: Payer: Self-pay | Admitting: Neurology

## 2018-04-08 NOTE — Telephone Encounter (Signed)
Called the patient to discuss the lab results. No answer. LVM informing the patient that her lab work was normal. instructed the patient to call us back if she has any questions.   If pt calls back please advise that Dr Leta Baptist reviewed the lab work and they were normal. There were no concerns.

## 2018-04-08 NOTE — Telephone Encounter (Signed)
-----   Message from [name], MD sent at 04/04/2018  1:23 PM EDT ----- Normal labs. Please call patient. -[name]

## 2018-04-15 DIAGNOSIS — M1712 Unilateral primary osteoarthritis, left knee: Secondary | ICD-10-CM | POA: Diagnosis not present

## 2018-04-17 ENCOUNTER — Ambulatory Visit
Admission: RE | Admit: 2018-04-17 | Discharge: 2018-04-17 | Disposition: A | Discharge status: Home / Self Care | Payer: Medicare Other | Source: Ambulatory Visit | Attending: Diagnostic Neuroimaging | Admitting: Diagnostic Neuroimaging

## 2018-04-17 DIAGNOSIS — R251 Tremor, unspecified: Secondary | ICD-10-CM

## 2018-04-17 DIAGNOSIS — G20 Parkinson's disease: Secondary | ICD-10-CM

## 2018-04-21 ENCOUNTER — Telehealth: Payer: Self-pay

## 2018-04-21 NOTE — Telephone Encounter (Signed)
Yes, recommend DATscan to be done. -[name]

## 2018-04-21 NOTE — Telephone Encounter (Signed)
Spoke with Annette Johnston and she verbalized understanding her results, but she wanted to know if she should still have the [place] done.  Please advise.

## 2018-04-21 NOTE — Telephone Encounter (Signed)
-----   Message from [name], MD sent at 04/17/2018  8:31 PM EDT ----- Unremarkable imaging results. Please call patient. Continue current plan. -[name]

## 2018-04-24 NOTE — Telephone Encounter (Signed)
Unable to get in contact with the patient, so I left a voicemail her with instructions that she should get the [place] done. Office phone number was also provided in case she had any further questions.

## 2018-05-06 DIAGNOSIS — M1712 Unilateral primary osteoarthritis, left knee: Secondary | ICD-10-CM | POA: Diagnosis not present

## 2018-05-07 ENCOUNTER — Telehealth: Payer: Self-pay | Admitting: *Deleted

## 2018-05-07 NOTE — Telephone Encounter (Signed)
Added additional ICD code D50.8 to [facility] request.  Fax confirmation received. [phone].

## 2018-05-15 ENCOUNTER — Encounter (HOSPITAL_COMMUNITY): Admission: RE | Admit: 2018-05-15 | Payer: Medicare Other | Source: Ambulatory Visit

## 2018-05-15 ENCOUNTER — Ambulatory Visit (HOSPITAL_COMMUNITY): Payer: Medicare Other

## 2018-05-22 ENCOUNTER — Ambulatory Visit (HOSPITAL_COMMUNITY): Admission: RE | Admit: 2018-05-22 | Payer: Medicare Other | Source: Ambulatory Visit

## 2018-05-22 ENCOUNTER — Ambulatory Visit (HOSPITAL_COMMUNITY)
Admission: RE | Admit: 2018-05-22 | Discharge: 2018-05-22 | Disposition: A | Discharge status: Home / Self Care | Payer: Medicare Other | Source: Ambulatory Visit | Attending: Diagnostic Neuroimaging | Admitting: Diagnostic Neuroimaging

## 2018-05-22 DIAGNOSIS — G20 Parkinson's disease: Secondary | ICD-10-CM | POA: Diagnosis not present

## 2018-05-22 DIAGNOSIS — R251 Tremor, unspecified: Secondary | ICD-10-CM

## 2018-05-22 MED ORDER — IOFLUPANE I 123 185 MBQ/2.5ML IV SOLN
4.3000 | Freq: Once | INTRAVENOUS | Status: AC
  Administered 2018-05-22: 4.3 via INTRAVENOUS
  Filled 2018-05-22: qty 5

## 2018-05-22 MED ORDER — IODINE STRONG (LUGOLS) 5 % PO SOLN
0.8000 mL | Freq: Once | ORAL | Status: AC
  Administered 2018-05-22: 0.8 mL via ORAL
  Filled 2018-05-22: qty 0.8

## 2018-05-23 ENCOUNTER — Other Ambulatory Visit (HOSPITAL_COMMUNITY): Payer: Medicare Other

## 2018-05-24 DIAGNOSIS — Z6832 Body mass index (BMI) 32.0-32.9, adult: Secondary | ICD-10-CM | POA: Diagnosis not present

## 2018-05-24 DIAGNOSIS — R21 Rash and other nonspecific skin eruption: Secondary | ICD-10-CM | POA: Diagnosis not present

## 2018-05-24 DIAGNOSIS — K580 Irritable bowel syndrome with diarrhea: Secondary | ICD-10-CM | POA: Diagnosis not present

## 2018-05-24 DIAGNOSIS — R634 Abnormal weight loss: Secondary | ICD-10-CM | POA: Diagnosis not present

## 2018-05-24 DIAGNOSIS — N390 Urinary tract infection, site not specified: Secondary | ICD-10-CM | POA: Diagnosis not present

## 2018-05-26 DIAGNOSIS — R300 Dysuria: Secondary | ICD-10-CM | POA: Diagnosis not present

## 2018-05-27 ENCOUNTER — Telehealth: Payer: Self-pay | Admitting: Diagnostic Neuroimaging

## 2018-05-27 NOTE — Telephone Encounter (Signed)
Pt states on 10-10 she went to [facility] for Parkinson's test but she hasn't been contacted with results.  Pt is asking for a call when the results are available

## 2018-05-28 ENCOUNTER — Ambulatory Visit (INDEPENDENT_AMBULATORY_CARE_PROVIDER_SITE_OTHER): Payer: Medicare Other | Admitting: Gastroenterology

## 2018-05-28 ENCOUNTER — Encounter: Payer: Self-pay | Admitting: Gastroenterology

## 2018-05-28 VITALS — BP 125/75 | HR 102 | Temp 97.1°F | Ht 67.0 in | Wt 212.0 lb

## 2018-05-28 DIAGNOSIS — R197 Diarrhea, unspecified: Secondary | ICD-10-CM
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
## 2018-05-28 NOTE — Assessment & Plan Note (Signed)
72 year old female with several year history of diarrhea, bouts of incontinence, s/p colonoscopy in 2017 unrevealing. No improvement with Bentyl. She has lost 10 lbs unintentionally over the past few months. Due to acute on chronic symptoms of worsening diarrhea, checking stool studies. If negative, she will start Viberzi 75 mg BID with food. Discussed possible side effects. Gallbladder remains in situ, no history of pancreatitis, no ETOH use. Check celiac serologies today. Return in 3 months for close follow-up. Will need more extensive evaluation especially if further weight loss and no improvement in diarrhea.

## 2018-05-28 NOTE — Telephone Encounter (Addendum)
Called patient and advised her, per Dr Leta Baptist the DAT scan showed a subtle suggestion of Parkinson's. Advised her that if she would like she can monitor her symptoms and come back for FU in Feb. She stated her tremors are getting worse. She stated she has stopped going out to eat because people are noticing that her hand and leg on her right side are trembling.  Occasionally her left hand and leg do as well. This RN advised Dr Leta Baptist had stated he could prescribe a medication, Carbidopa Levodopa for her. She has requested to start a medication for her tremors. Confirmed her pharmacy, [facility] in [place]. She asked if it would affect her IBS. This RN advised constipation is listed as a possible side effect. She stated she would like to begin medication.  This RN advised will let Dr Leta Baptist know. Advised she call for any questions or concerns after starting medication.She verbalized understanding, appreciation.

## 2018-05-28 NOTE — Progress Notes (Signed)
CC'D TO PCP °

## 2018-05-28 NOTE — Patient Instructions (Addendum)
Please have blood work done today. We have also ordered stool studies.  I have given you samples of Viberzi. Do not take this until after we call with results of stool studies. If these are negative, you will take it twice a day with food. Monitor for right-sided abdominal pain, nausea, and vomiting with taking Viberzi. If this happens, call us and stop the drug immediately. Stop Bentyl while you are on Viberzi.   I would follow a lactose free diet for now. I have included the handout.    We will see you in 3 months!  It was a pleasure to see you today. I strive to create trusting relationships with patients to provide genuine, compassionate, and quality care. I value your feedback. If you receive a survey regarding your visit,  I greatly appreciate you taking time to fill this out.   Annitta Needs, PhD, ANP-BC [facility]   Lactose-Free Diet, Adult If you have lactose intolerance, you are not able to digest lactose. Lactose is a natural sugar found mainly in milk and milk products. You may need to avoid all foods and beverages that contain lactose. A lactose-free diet can help you do this. What do I need to know about this diet?  Do not consume foods, beverages, vitamins, minerals, or medicines with lactose. Read ingredients lists carefully.  Look for the words "lactose-free" on labels.  Use lactase enzyme drops or tablets as directed by your health care provider.  Use lactose-free milk or a milk alternative, such as soy milk, for drinking and cooking.  Make sure you get enough calcium and vitamin D in your diet. A lactose-free eating plan can be lacking in these important nutrients.  Take calcium and vitamin D supplements as directed by your health care provider. Talk to your provider about supplements if you are not able to get enough calcium and vitamin D from food. Which foods have lactose? Lactose is found in:  Milk and foods made from  milk.  Yogurt.  Cheese.  Butter.  Margarine.  Sour cream.  Cream.  Whipped toppings and nondairy creamers.  Ice cream and other milk-based desserts.  Lactose is also found in foods or products made with milk or milk ingredients. To find out whether a food contains milk or a milk ingredient, look at the ingredients list. Avoid foods with the statement "May contain milk" and foods that contain:  Butter.  Cream.  Milk.  Milk solids.  Milk powder.  Whey.  Curd.  Caseinate.  Lactose.  Lactalbumin.  Lactoglobulin.  What are some alternatives to milk and foods made with milk products?  Lactose-free milk.  Soy milk with added calcium and vitamin D.  Almond, coconut, or rice milk with added calcium and vitamin D. Note that these are low in protein.  Soy products, such as soy yogurt, soy cheese, soy ice cream, and soy-based sour cream. Which foods can I eat? Grains Breads and rolls made without milk, such as [place], [place], or [place] bread, bagels, pita, and [employer]. Corn tortillas, corn meal, grits, and polenta. Crackers without lactose or milk solids, such as soda crackers and graham crackers. Cooked or dry cereals without lactose or milk solids. Pasta, quinoa, couscous, barley, oats, bulgur, farro, rice, wild rice, or other grains prepared without milk or lactose. Plain popcorn. Vegetables Fresh, frozen, and canned vegetables without cheese, cream, or butter sauces. Fruits All fresh, canned, frozen, or dried fruits that are not processed with lactose. Meats and Other Protein Sources Plain  beef, chicken, fish, [place], lamb, veal, pork, wild game, or ham. Kosher-prepared meat products. Strained or junior meats that do not contain milk. Eggs. Soy meat substitutes. Beans, lentils, and hummus. Tofu. Nuts and seeds. Peanut or other nut butters without lactose. Soups, casseroles, and mixed dishes without cheese, cream, or milk. Dairy Lactose-free milk. Soy, rice, or  almond milk with added calcium and vitamin D. Soy cheese and yogurt. Beverages Carbonated drinks. Tea. Coffee, freeze-dried coffee, and some instant coffees. Fruit and vegetable juices. Condiments Soy sauce. Carob powder. Olives. Gravy made with water. Baker's cocoa. Angie Fava. Pure seasonings and spices. Ketchup. Mustard. Bouillon. Broth. Sweets and Desserts Water and fruit ices. Gelatin. Cookies, pies, or cakes made from allowed ingredients, such as angel food cake. Pudding made with water or a milk substitute. Lactose-free tofu desserts. Soy, coconut milk, or rice-milk-based frozen desserts. Sugar. Honey. Jam, jelly, and marmalade. Molasses. Pure sugar candy. Dark chocolate without milk. Marshmallows. Fats and Oils Margarines and salad dressings that do not contain milk. Berniece Salines. Vegetable oils. Shortening. Mayonnaise. Soy or coconut-based cream. The items listed above may not be a complete list of recommended foods or beverages. Contact your dietitian for more options. Which foods are not recommended? Grains Breads and rolls that contain milk. Toaster pastries. Muffins, biscuits, waffles, cornbread, and pancakes. These can be prepared at home, commercial, or from mixes. Sweet rolls, donuts, English muffins, fry bread, lefse, flour tortillas with lactose, or [place] toast made with milk or milk ingredients. Crackers that contain lactose. Corn curls. Cooked or dry cereals with lactose. Vegetables Creamed or breaded vegetables. Vegetables in a cheese or butter sauce or with lactose-containing margarines. Instant potatoes. [place] fries. Scalloped or au gratin potatoes. Fruits None. Meats and Other Protein Sources Scrambled eggs, omelets, and souffles that contain milk. Creamed or breaded meat, fish, chicken, or [place]. Sausage products, such as wieners and liver sausage. Cold cuts that contain milk solids. Cheese, cottage cheese, ricotta cheese, and cheese spreads. Lasagna and macaroni and cheese.  Pizza. Peanut or other nut butters with added milk solids. Casseroles or mixed dishes containing milk or cheese. Dairy All dairy products, including milk, goat's milk, buttermilk, kefir, acidophilus milk, flavored milk, evaporated milk, condensed milk, dulce de [place], eggnog, yogurt, cheese, and cheese spreads. Beverages Hot chocolate. Cocoa with lactose. Instant iced teas. Powdered fruit drinks. Smoothies made with milk or yogurt. Condiments Chewing gum that has lactose. Cocoa that has lactose. Spice blends if they contain milk products. Artificial sweeteners that contain lactose. Nondairy creamers. Sweets and Desserts Ice cream, ice milk, gelato, sherbet, and frozen yogurt. Custard, pudding, and mousse. Cake, cream pies, cookies, and other desserts containing milk, cream, cream cheese, or milk chocolate. Pie crust made with milk-containing margarine or butter. Reduced-calorie desserts made with a sugar substitute that contains lactose. Toffee and butterscotch. Milk, white, or dark chocolate that contains milk. Fudge. Caramel. Fats and Oils Margarines and salad dressings that contain milk or cheese. Cream. Half and half. Cream cheese. Sour cream. Chip dips made with sour cream or yogurt. The items listed above may not be a complete list of foods and beverages to avoid. Contact your dietitian for more information. Am I getting enough calcium? Calcium is found in many foods that contain lactose and is important for bone health. The amount of calcium you need depends on your age:  Adults younger than 50 years: 1000 mg of calcium a day.  Adults older than 50 years: 1200 mg of calcium a day.  If you are not  getting enough calcium, other calcium sources include:  Orange juice with calcium added. There are 300-350 mg of calcium in 1 cup of orange juice.  Sardines with edible bones. There are 325 mg of calcium in 3 oz of sardines.  Calcium-fortified soy milk. There are 300-400 mg of calcium in 1  cup of calcium-fortified soy milk.  Calcium-fortified rice or almond milk. There are 300 mg of calcium in 1 cup of calcium-fortified rice or almond milk.  Canned salmon with edible bones. There are 180 mg of calcium in 3 oz of canned salmon with edible bones.  Calcium-fortified breakfast cereals. There are [phone] mg of calcium in calcium-fortified breakfast cereals.  Tofu set with calcium sulfate. There are 250 mg of calcium in  cup of tofu set with calcium sulfate.  Spinach, cooked. There are 145 mg of calcium in  cup of cooked spinach.  Edamame, cooked. There are 130 mg of calcium in  cup of cooked edamame.  Collard greens, cooked. There are 125 mg of calcium in  cup of cooked collard greens.  Kale, frozen or cooked. There are 90 mg of calcium in  cup of cooked or frozen kale.  Almonds. There are 95 mg of calcium in  cup of almonds.  Broccoli, cooked. There are 60 mg of calcium in 1 cup of cooked broccoli.  This information is not intended to replace advice given to you by your health care provider. Make sure you discuss any questions you have with your health care provider. Document Released: 01/19/2002 Document Revised: 01/05/2016 Document Reviewed: 10/30/2013 Elsevier Interactive Patient Education  2018 [employer].

## 2018-05-29 ENCOUNTER — Encounter: Payer: Self-pay | Admitting: Psychiatry

## 2018-05-29 ENCOUNTER — Ambulatory Visit (INDEPENDENT_AMBULATORY_CARE_PROVIDER_SITE_OTHER): Payer: Medicare Other | Admitting: Psychiatry

## 2018-05-29 DIAGNOSIS — R197 Diarrhea, unspecified: Secondary | ICD-10-CM | POA: Diagnosis not present

## 2018-05-29 DIAGNOSIS — F3181 Bipolar II disorder: Secondary | ICD-10-CM

## 2018-05-29 DIAGNOSIS — G2401 Drug induced subacute dyskinesia: Secondary | ICD-10-CM | POA: Diagnosis not present

## 2018-05-29 DIAGNOSIS — F411 Generalized anxiety disorder: Secondary | ICD-10-CM | POA: Diagnosis not present

## 2018-05-29 LAB — TISSUE TRANSGLUTAMINASE, IGA: (tTG) Ab, IgA: 1 U/mL

## 2018-05-29 LAB — IGA: Immunoglobulin A: 145 mg/dL (ref 20–320)

## 2018-05-29 MED ORDER — CARBIDOPA-LEVODOPA 25-100 MG PO TABS: 1.0000 | ORAL_TABLET | Freq: Three times a day (TID) | ORAL | 6 refills | Status: DC

## 2018-05-29 NOTE — Telephone Encounter (Addendum)
Per Dr Leta Baptist, called patient to schedule follow up in 6-8 weeks  re; starting new medication. Phone was answered, but person hung up.  Called back and was able to LVM requesting a call back to schedule FU in 6-8 weeks.

## 2018-05-29 NOTE — Telephone Encounter (Signed)
Carbidopa / levodopa 25/100 - half three times a day x 2 weeks, then increase to 1 tab three times a day - take 30 min before meals.  Meds ordered this encounter  Medications  . carbidopa-levodopa (SINEMET IR) 25-100 MG tablet    Sig: Take 1 tablet by mouth 3 (three) times daily before meals.    Dispense:  90 tablet    Refill:  Bagley, MD 47/42/5956, 38:75 PM Certified in Neurology, Neurophysiology and [facility] [address], [place], [place] [address] [phone]

## 2018-05-29 NOTE — Addendum Note (Signed)
Addended by: Andrey Spearman R on: 05/29/2018 12:05 PM   Modules accepted: Orders

## 2018-05-29 NOTE — Telephone Encounter (Signed)
Patient called in asking if Carbidopa Levodopa has been sent to her pharmacy yet.

## 2018-05-29 NOTE — Telephone Encounter (Signed)
Spoke with patient and informed her Dr Leta Baptist sent in new prescription, Carbidopa-levodopa. Asked her to write down instructions for taking. She stated she would; this RN gave her Dr Gladstone Lighter specific instructions, and she repeated them back correctly.  She then asked if she should stop taking [name] prescribed by her psychiatrist. This RN advised she discuss with psychiatrist. She stated she was on her way to see him now, verbalized understanding, appreciation for call.

## 2018-05-29 NOTE — Progress Notes (Signed)
[facility]  Patient ID: Annette Johnston,  MRN: [id number]  PCP: Manon Hilding, MD  Date of Evaluation: 05/29/2018 Time spent: 45 minutes   HISTORY/CURRENT STATUS: HPI CC health px and mood. Wonders if can stop [name] since starting Sinemet soon. Sees benefit from [name] but only in the last month. Pt reports that mood is Anxious, Dysphoric and bored and unmotivated. and describes anxiety as Moderate. Anxiety symptoms include: Excessive Worry,. Pt reports irregular sleep pattern and still lays in bed a lot. Pt reports that appetite is good. Pt reports that energy is lethargic and anhedonia. Concentration is down slightly. Suicidal thoughts:  denied by patient. No difference with reduction paroxetine.  Individual Medical History/ Review of Systems: Changes? :Yes Neuro starting her on Parkinson's med and thinks she has PD. 1 fall since here.  Allergies: Latex  Current Medications:  Current Outpatient Medications:  .  carbidopa-levodopa (SINEMET IR) 25-100 MG tablet, Take 1 tablet by mouth 3 (three) times daily before meals., Disp: 90 tablet, Rfl: 6 .  dicyclomine (BENTYL) 10 MG capsule, TAKE 1 CAPSULE (10 MG TOTAL) BY MOUTH 4 (FOUR) TIMES DAILY - BEFORE MEALS AND AT BEDTIME., Disp: 360 capsule, Rfl: 1 .  levothyroxine (SYNTHROID, LEVOTHROID) 137 MCG tablet, Take 137 mcg by mouth daily before breakfast., Disp: , Rfl:  .  LORazepam (ATIVAN) 0.5 MG tablet, Take 0.5 mg by mouth daily.  , Disp: , Rfl:  .  Oxcarbazepine (TRILEPTAL) 300 MG tablet, Take 300 mg by mouth 2 (two) times daily., Disp: , Rfl:  .  PARoxetine (PAXIL) 30 MG tablet, Take 15 mg by mouth daily. , Disp: , Rfl:  .  Valbenazine Tosylate ([name]) 40 & 80 MG CPPK, Take by mouth. 40 and 80 at night, Disp: , Rfl:  .  Carbamazepine (EQUETRO) 100 MG CP12 12 hr capsule, Take 100 mg by mouth. , Disp: , Rfl:  .  cephALEXin (KEFLEX) 500 MG capsule, Take 1 capsule by mouth 2 (two) times daily., Disp: , Rfl:  .   clotrimazole-betamethasone (LOTRISONE) cream, , Disp: , Rfl:  .  diclofenac sodium (VOLTAREN) 1 % GEL, 3 (three) times daily as needed., Disp: , Rfl: 5 Medication Side Effects: None  Family Medical/ Social History: Changes? Yes Not able to work or drive.  Fearful of it.  MENTAL HEALTH EXAM:  There were no vitals taken for this visit.There is no height or weight on file to calculate BMI.  General Appearance: less fidgety and less dyskinesia.  a bit slow.  Eye Contact:  Good  Speech:  Pressured mild  Volume:  Normal  Mood:  Anxious and Dysphoric  Affect:  Congruent and Restricted  Thought Process:  Goal Directed  Orientation:  Full (Time, Place, and Person)  Thought Content: WDL   Suicidal Thoughts:  No  Homicidal Thoughts:  No  Memory:  Recent  Judgement:  Fair  Insight:  Fair  Psychomotor Activity:  TD and fidgety  Concentration:  Attention Span: Fair  Recall:  Good  Fund of Knowledge: Good  Language: Good  Akathisia:  mild  AIMS (if indicated): 23, last 27 but now symptoms are only 23 at evening and better in the day.  Assets:  Housing Social Support Talents/Skills Transportation  ADL's:  Intact  Cognition: WNL  Prognosis:  Fair    DIAGNOSES:    ICD-10-CM   1. Bipolar II disorder ([place]) F31.81   2. Generalized anxiety disorder F41.1   3. Tardive dyskinesia G24.01   's onset of  she of after she abruptly stopped aripiprazole. 4. RO Parkinson's dz per neuro  RECOMMENDATIONS:  Greater than 50% of face to face time with patient was spent on counseling and coordination of care. We discussed her movement disorder and reviewed her notes from the neurologist was just just that an element may be Parkinson's disease.  Discussed how her necessary mood stabilizers may interfere with the effectiveness of the [name] due to lowering [name] blood levels.  Therefore we switched her recently from carbamazepine to oxcarbazepine and raise the dosage of the [name].  She clearly has  improvement in tardive dyskinesia though her scores do not dramatically show that.  Her dyskinesia is now reduced to primarily in the evening and is not is affecting her as much during the day.  Her score today was based on her report of symptoms when they are at their worst in the evening. Clear improvement with [name] increase to 120mg /D She is not having mood swings since the switch to oxcarbazepine so far.  She is aware that this is not a first line mood stabilizer  Follow-up 8 weeks   Purnell Shoemaker, MD

## 2018-06-02 ENCOUNTER — Telehealth: Payer: Self-pay | Admitting: *Deleted

## 2018-06-02 LAB — GIARDIA ANTIGEN
MICRO NUMBER:: 91251479
RESULT:: NOT DETECTED
SPECIMEN QUALITY:: ADEQUATE

## 2018-06-02 LAB — CLOSTRIDIUM DIFFICILE TOXIN B, QUALITATIVE, REAL-TIME PCR: CDIFFPCR: NOT DETECTED

## 2018-06-02 LAB — STOOL CULTURE
MICRO NUMBER:: 91252109
MICRO NUMBER:: 91252110
MICRO NUMBER:: 91252111
SHIGA RESULT:: NOT DETECTED
SPECIMEN QUALITY: ADEQUATE
SPECIMEN QUALITY:: ADEQUATE
SPECIMEN QUALITY:: ADEQUATE

## 2018-06-02 LAB — C. DIFFICILE GDH AND TOXIN A/B
GDH ANTIGEN: DETECTED
MICRO NUMBER:: 91251939
SPECIMEN QUALITY:: ADEQUATE
TOXIN A AND B: NOT DETECTED

## 2018-06-02 NOTE — Telephone Encounter (Signed)
Received call back form patient. Per Dr Leta Baptist, scheduled her for FU in Dec re: starting new medication. she asked for address, this was given to her. Advised she arrive 30 minutes early to check in. She verbalized understanding, appreciation.

## 2018-06-02 NOTE — Telephone Encounter (Signed)
error 

## 2018-06-03 NOTE — Progress Notes (Signed)
Cdiff testing was indeterminate, reflexed to PCR and negative. Not consistent with CDI. Negative celiac serologies. Stool culture and Giardia negative. She may start Viberzi twice a day with food. Monitor for abdominal pain, N/V. Stop Bentyl while on Viberzi.

## 2018-06-10 ENCOUNTER — Telehealth: Payer: Self-pay | Admitting: Internal Medicine

## 2018-06-10 MED ORDER — ELUXADOLINE 75 MG PO TABS: 1.0000 | ORAL_TABLET | Freq: Two times a day (BID) | ORAL | 3 refills | Status: DC

## 2018-06-10 NOTE — Telephone Encounter (Signed)
Pt is aware.  

## 2018-06-10 NOTE — Telephone Encounter (Signed)
Forwarding to Roseanne Kaufman, NP who gave the samples.

## 2018-06-10 NOTE — Telephone Encounter (Signed)
Done

## 2018-06-10 NOTE — Telephone Encounter (Signed)
PATIENT GIVEN SAMPLES OF VIBERZI FOR DIARRHEA AND WOULD LIKE A PRESCRIPTION SENT TO [place] [place]   [phone]

## 2018-06-10 NOTE — Addendum Note (Signed)
Addended by: Annitta Needs on: 06/10/2018 01:12 PM   Modules accepted: Orders

## 2018-06-17 ENCOUNTER — Ambulatory Visit: Payer: Medicare Other | Admitting: Allergy & Immunology

## 2018-06-20 ENCOUNTER — Other Ambulatory Visit: Payer: Self-pay

## 2018-06-20 MED ORDER — PAROXETINE HCL 30 MG PO TABS: 30.0000 mg | ORAL_TABLET | Freq: Every day | ORAL | 0 refills | Status: DC

## 2018-07-03 ENCOUNTER — Other Ambulatory Visit: Payer: Self-pay

## 2018-07-03 MED ORDER — LORAZEPAM 0.5 MG PO TABS: ORAL_TABLET | ORAL | 1 refills | Status: DC

## 2018-07-14 ENCOUNTER — Other Ambulatory Visit: Payer: Self-pay | Admitting: Psychiatry

## 2018-07-15 ENCOUNTER — Encounter: Payer: Self-pay | Admitting: Emergency Medicine

## 2018-07-15 DIAGNOSIS — F411 Generalized anxiety disorder: Secondary | ICD-10-CM | POA: Insufficient documentation

## 2018-07-15 DIAGNOSIS — F319 Bipolar disorder, unspecified: Secondary | ICD-10-CM | POA: Insufficient documentation

## 2018-07-16 ENCOUNTER — Encounter: Payer: Self-pay | Admitting: Diagnostic Neuroimaging

## 2018-07-16 ENCOUNTER — Ambulatory Visit (INDEPENDENT_AMBULATORY_CARE_PROVIDER_SITE_OTHER): Payer: Medicare Other | Admitting: Diagnostic Neuroimaging

## 2018-07-16 VITALS — BP 126/84 | HR 108 | Ht 66.5 in | Wt 206.6 lb

## 2018-07-16 DIAGNOSIS — G20 Parkinson's disease: Secondary | ICD-10-CM

## 2018-07-16 DIAGNOSIS — G2401 Drug induced subacute dyskinesia: Secondary | ICD-10-CM | POA: Diagnosis not present

## 2018-07-16 MED ORDER — CARBIDOPA-LEVODOPA 25-100 MG PO TABS: 1.0000 | ORAL_TABLET | Freq: Three times a day (TID) | ORAL | 12 refills | Status: DC
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
## 2018-07-16 NOTE — Patient Instructions (Signed)
  PARKINSON'S DISEASE (parkinsonian symptoms; slightly positive DATscan) - continue carbidopa/levodopa 1 tabs three times a day for parkinsonism; may increase to 2 tabs three times a day after tapering off ingrezza  TARDIVE DYSKINESIA - consider tapering off ingrezza (discuss with Dr. Clovis Pu)

## 2018-07-16 NOTE — Addendum Note (Signed)
Addended by: Andrey Spearman R on: 07/16/2018 03:17 PM   Modules accepted: Orders

## 2018-07-24 DIAGNOSIS — G20 Parkinson's disease: Secondary | ICD-10-CM | POA: Diagnosis not present

## 2018-07-24 DIAGNOSIS — R2689 Other abnormalities of gait and mobility: Secondary | ICD-10-CM | POA: Diagnosis not present

## 2018-07-24 DIAGNOSIS — M6281 Muscle weakness (generalized): Secondary | ICD-10-CM | POA: Diagnosis not present

## 2018-07-24 DIAGNOSIS — G2401 Drug induced subacute dyskinesia: Secondary | ICD-10-CM | POA: Diagnosis not present

## 2018-07-28 ENCOUNTER — Ambulatory Visit (INDEPENDENT_AMBULATORY_CARE_PROVIDER_SITE_OTHER): Payer: Medicare Other | Admitting: Psychiatry

## 2018-07-28 ENCOUNTER — Encounter: Payer: Self-pay | Admitting: Psychiatry

## 2018-07-28 DIAGNOSIS — G2401 Drug induced subacute dyskinesia: Secondary | ICD-10-CM | POA: Diagnosis not present

## 2018-07-28 DIAGNOSIS — F3181 Bipolar II disorder: Secondary | ICD-10-CM | POA: Diagnosis not present

## 2018-07-28 DIAGNOSIS — F411 Generalized anxiety disorder: Secondary | ICD-10-CM

## 2018-07-28 MED ORDER — LORAZEPAM 0.5 MG PO TABS: ORAL_TABLET | ORAL | 1 refills | Status: DC

## 2018-07-28 NOTE — Progress Notes (Addendum)
Annette Johnston [id number] [birth year] 72 y.o.  Subjective:   Patient ID:  Annette Johnston is a 72 y.o. (DOB 22-May-1946) female.  Chief Complaint:  Chief Complaint  Patient presents with  . Follow-up    Medication Management    HPI Annette Johnston presents to the office today for follow-up of psych problems and movement disorder.  Neuro says she has combo of TD and PD.  She wants to come off the [name] bc she doesn't think it helps.  The chart notes indicate otherwise.  She notes the improvement in hand tremor on the Sinemet.  Pt reports that mood is irritable, Anxious, Dysphoric and bored and unmotivated. and describes anxiety as Moderate. Anxiety symptoms include: Excessive Worry,. Pt reports irregular sleep pattern and still lays in bed a lot at night but not in the day anymore. Pt reports that appetite is good. Pt reports that energy is lethargic and anhedonia. Concentration is down slightly. Suicidal thoughts:  denied by patient. No difference with reduction paroxetine  Review of Systems:  Review of Systems  Gastrointestinal: Negative for abdominal pain.  Neurological: Positive for tremors. Negative for weakness.       Fidgety.  Psychiatric/Behavioral: Positive for decreased concentration, dysphoric mood and sleep disturbance. Negative for agitation, behavioral problems, confusion, hallucinations, self-injury and suicidal ideas. The patient is nervous/anxious and is hyperactive.     Medications: I have reviewed the patient's current medications.  Current Outpatient Medications  Medication Sig Dispense Refill  . carbidopa-levodopa (SINEMET IR) 25-100 MG tablet Take 1-2 tablets by mouth 3 (three) times daily before meals. 180 tablet 12  . clotrimazole-betamethasone (LOTRISONE) cream     . diclofenac sodium (VOLTAREN) 1 % GEL 3 (three) times daily as needed.  5  . diphenhydramine-acetaminophen (TYLENOL PM) 25-500 MG TABS tablet Take 1 tablet by mouth at bedtime as needed.    .  Eluxadoline (VIBERZI) 75 MG TABS Take 1 tablet by mouth 2 (two) times daily with a meal. 60 tablet 3  . levothyroxine (SYNTHROID, LEVOTHROID) 137 MCG tablet Take 137 mcg by mouth daily before breakfast.    . LORazepam (ATIVAN) 0.5 MG tablet Take 1 & 1/2 tabs at Bedtime.(0.75 mg)Total. 45 tablet 1  . Multiple Vitamin (MULTIVITAMIN) tablet Take 1 tablet by mouth daily.    . Oxcarbazepine (TRILEPTAL) 300 MG tablet Take 300 mg by mouth 2 (two) times daily.    Marland Kitchen PARoxetine (PAXIL) 30 MG tablet Take 1 tablet (30 mg total) by mouth daily. 90 tablet 0  . Valbenazine Tosylate ([name]) 40 & 80 MG CPPK Take by mouth. 40 and 80 at night    . Carbamazepine (EQUETRO) 100 MG CP12 12 hr capsule Take 100 mg by mouth.     . dicyclomine (BENTYL) 10 MG capsule TAKE 1 CAPSULE (10 MG TOTAL) BY MOUTH 4 (FOUR) TIMES DAILY - BEFORE MEALS AND AT BEDTIME. (Patient not taking: Reported on 07/16/2018) 360 capsule 1   No current facility-administered medications for this visit.     Medication Side Effects: None  Allergies:  Allergies  Allergen Reactions  . Latex Rash    Bandaids    Past Medical History:  Diagnosis Date  . Anxiety   . Bipolar 1 disorder ([place])   . Depression   . Dyspnea on exertion july 2011   Normal stress test dec 2010. Reportedl normal CXR 2011. CT abdomen lung cut 2005  - normal. Normal PFT - march 2012  . Hypothyroidism   . Obesity (BMI 30-39.9)  BMI 36 Feb 2012  . Urticaria     Family History  Problem Relation Age of Onset  . Alzheimer's disease Mother   . Heart attack Father   . Coronary artery disease Brother   . Heart attack Brother   . Alzheimer's disease Paternal Grandmother   . Allergic rhinitis Neg Hx   . Angioedema Neg Hx   . Asthma Neg Hx   . Atopy Neg Hx   . Eczema Neg Hx   . Immunodeficiency Neg Hx   . Urticaria Neg Hx     Social History   Socioeconomic History  . Marital status: Married    Spouse name: [name]  . Number of children: 1  . Years of education:  Not on file  . Highest education level: High school graduate  Occupational History  . Occupation: retired  Scientific laboratory technician  . Financial resource strain: Not on file  . Food insecurity:    Worry: Not on file    Inability: Not on file  . Transportation needs:    Medical: Not on file    Non-medical: Not on file  Tobacco Use  . Smoking status: Never Smoker  . Smokeless tobacco: Never Used  Substance and Sexual Activity  . Alcohol use: Not Currently  . Drug use: No  . Sexual activity: Yes    Birth control/protection: Surgical  Lifestyle  . Physical activity:    Days per week: Not on file    Minutes per session: Not on file  . Stress: Not on file  Relationships  . Social connections:    Talks on phone: Not on file    Gets together: Not on file    Attends religious service: Not on file    Active member of club or organization: Not on file    Attends meetings of clubs or organizations: Not on file    Relationship status: Not on file  . Intimate partner violence:    Fear of current or ex partner: Not on file    Emotionally abused: Not on file    Physically abused: Not on file    Forced sexual activity: Not on file  Other Topics Concern  . Not on file  Social History Narrative   Caffeine 1 cup per day    Lives at home with husband    Right Handed.     Past Medical History, Surgical history, Social history, and Family history were reviewed and updated as appropriate.   Please see review of systems for further details on the patient's review from today.   Objective:   Physical Exam:  There were no vitals taken for this visit.  Physical Exam Constitutional:      Appearance: She is obese.  Neurological:     Motor: Tremor present.     Gait: Gait normal.  Psychiatric:        Attention and Perception: She is inattentive. She does not perceive auditory hallucinations.        Mood and Affect: Mood is anxious and depressed. Affect is not tearful.        Speech: Speech normal.         Behavior: Behavior normal. Behavior is cooperative.        Thought Content: Thought content is not paranoid. Thought content does not include homicidal or suicidal ideation.        Cognition and Memory: Cognition normal.     Comments: Insight chronically fair. Judgment fair. Fidgety bothers her.  Shakes legs all day.  Less hand tremor.   Last AIMS October 17=23 Today=21 Insight is fair at best.  She tends to miss attribute symptoms to the wrong cause..  Lab Review:     Component Value Date/Time   NA 137 12/17/2017 1056   K 4.8 12/17/2017 1056   CL 98 12/17/2017 1056   CO2 23 12/17/2017 1056   GLUCOSE 89 12/17/2017 1056   GLUCOSE 87 06/06/2016 0959   BUN 15 12/17/2017 1056   CREATININE 0.64 12/17/2017 1056   CALCIUM 9.4 12/17/2017 1056   PROT 7.3 12/17/2017 1056   ALBUMIN 4.2 12/17/2017 1056   AST 30 12/17/2017 1056   ALT 26 12/17/2017 1056   ALKPHOS 77 12/17/2017 1056   BILITOT <0.2 12/17/2017 1056   GFRNONAA 90 12/17/2017 1056   GFRAA 104 12/17/2017 1056       Component Value Date/Time   WBC 6.7 12/17/2017 1056   WBC 6.1 06/06/2016 0959   RBC 4.27 12/17/2017 1056   RBC 4.64 06/06/2016 0959   HGB 13.1 12/17/2017 1056   HCT 38.2 12/17/2017 1056   PLT 329 12/17/2017 1056   MCV 90 12/17/2017 1056   MCH 30.7 12/17/2017 1056   MCH 30.0 06/06/2016 0959   MCHC 34.3 12/17/2017 1056   MCHC 33.7 06/06/2016 0959   RDW 14.0 12/17/2017 1056   LYMPHSABS 1.6 12/17/2017 1056   MONOABS 0.5 06/06/2016 0959   EOSABS 0.3 12/17/2017 1056   BASOSABS 0.0 12/17/2017 1056    No results found for: POCLITH, LITHIUM   No results found for: PHENYTOIN, PHENOBARB, VALPROATE, CBMZ   .res Assessment: Plan:    Tardive dyskinesia  Bipolar II disorder (HCC)  Generalized anxiety disorder   We discussed her movement disorder and reviewed her notes from the neurologist was just just that an element may be Parkinson's disease.  Discussed how her necessary mood stabilizers may  interfere with the effectiveness of the [name] due to lowering [name] blood levels.  Therefore we switched her recently from carbamazepine to oxcarbazepine and raise the dosage of the [name].  She clearly has improvement in tardive dyskinesia though her scores do not dramatically show that.  Her dyskinesia is now reduced to primarily in the evening and is not is affecting her as much during the day.  Her score today was based on her report of symptoms when they are at their worst in the evening.  Sees neurologist in April. Clear improvement with [name] increase to 120mg /D but she doubts this.   She is not having mood swings since the switch to oxcarbazepine so far.  She is aware that this is not a first line mood stabilizer.  She CO more irritable and wants to increase the Ativan. Consider increasing this or switching back to CBZ.  We discussed the short-term risks associated with benzodiazepines including sedation and increased fall risk among others.  Discussed long-term side effect risk including dependence, potential withdrawal symptoms, and the potential eventual dose-related risk of dementia. Rec only using Ativan prn.  Per her request and neuro suggestion, we'll reduce [name] to 80 mg daily.  Cautioned TD could get worse and to notify us and the neurologist if it does.  This appt was 30 mins.  FU 6 weeks and if she's done ok with the decrease we'll decrease it again with goal of weaning.  Lynder Parents, MD, DFAPA      Please see After Visit Summary for patient specific instructions.  Future Appointments  Date Time Provider [place]  09/09/2018 11:00 AM  Annitta Needs, NP [facility]  12/10/2018  2:30 PM Penumalli, Earlean Polka, MD [facility] None    No orders of the defined types were placed in this encounter.     -------------------------------

## 2018-07-28 NOTE — Addendum Note (Signed)
Addended by: Reatha Armour on: 07/28/2018 02:07 PM   Modules accepted: Orders

## 2018-07-30 DIAGNOSIS — R2689 Other abnormalities of gait and mobility: Secondary | ICD-10-CM | POA: Diagnosis not present

## 2018-07-30 DIAGNOSIS — G20 Parkinson's disease: Secondary | ICD-10-CM | POA: Diagnosis not present

## 2018-07-30 DIAGNOSIS — M6281 Muscle weakness (generalized): Secondary | ICD-10-CM | POA: Diagnosis not present

## 2018-07-30 DIAGNOSIS — G2401 Drug induced subacute dyskinesia: Secondary | ICD-10-CM | POA: Diagnosis not present

## 2018-07-31 DIAGNOSIS — R2689 Other abnormalities of gait and mobility: Secondary | ICD-10-CM | POA: Diagnosis not present

## 2018-07-31 DIAGNOSIS — M6281 Muscle weakness (generalized): Secondary | ICD-10-CM | POA: Diagnosis not present

## 2018-07-31 DIAGNOSIS — G20 Parkinson's disease: Secondary | ICD-10-CM | POA: Diagnosis not present

## 2018-07-31 DIAGNOSIS — G2401 Drug induced subacute dyskinesia: Secondary | ICD-10-CM | POA: Diagnosis not present

## 2018-08-05 DIAGNOSIS — M6281 Muscle weakness (generalized): Secondary | ICD-10-CM | POA: Diagnosis not present

## 2018-08-05 DIAGNOSIS — R2689 Other abnormalities of gait and mobility: Secondary | ICD-10-CM | POA: Diagnosis not present

## 2018-08-05 DIAGNOSIS — G2401 Drug induced subacute dyskinesia: Secondary | ICD-10-CM | POA: Diagnosis not present

## 2018-08-05 DIAGNOSIS — G20 Parkinson's disease: Secondary | ICD-10-CM | POA: Diagnosis not present

## 2018-08-14 DIAGNOSIS — G20 Parkinson's disease: Secondary | ICD-10-CM | POA: Diagnosis not present

## 2018-08-14 DIAGNOSIS — G2401 Drug induced subacute dyskinesia: Secondary | ICD-10-CM | POA: Diagnosis not present

## 2018-08-14 DIAGNOSIS — M6281 Muscle weakness (generalized): Secondary | ICD-10-CM | POA: Diagnosis not present

## 2018-08-14 DIAGNOSIS — R2689 Other abnormalities of gait and mobility: Secondary | ICD-10-CM | POA: Diagnosis not present

## 2018-08-18 DIAGNOSIS — G2401 Drug induced subacute dyskinesia: Secondary | ICD-10-CM | POA: Diagnosis not present

## 2018-08-18 DIAGNOSIS — R2689 Other abnormalities of gait and mobility: Secondary | ICD-10-CM | POA: Diagnosis not present

## 2018-08-18 DIAGNOSIS — M6281 Muscle weakness (generalized): Secondary | ICD-10-CM | POA: Diagnosis not present

## 2018-08-18 DIAGNOSIS — G20 Parkinson's disease: Secondary | ICD-10-CM | POA: Diagnosis not present

## 2018-08-22 DIAGNOSIS — M6281 Muscle weakness (generalized): Secondary | ICD-10-CM | POA: Diagnosis not present

## 2018-08-22 DIAGNOSIS — G2401 Drug induced subacute dyskinesia: Secondary | ICD-10-CM | POA: Diagnosis not present

## 2018-08-22 DIAGNOSIS — R2689 Other abnormalities of gait and mobility: Secondary | ICD-10-CM | POA: Diagnosis not present

## 2018-08-22 DIAGNOSIS — G20 Parkinson's disease: Secondary | ICD-10-CM | POA: Diagnosis not present

## 2018-08-27 DIAGNOSIS — M25511 Pain in right shoulder: Secondary | ICD-10-CM | POA: Diagnosis not present

## 2018-08-27 DIAGNOSIS — S161XXA Strain of muscle, fascia and tendon at neck level, initial encounter: Secondary | ICD-10-CM | POA: Diagnosis not present

## 2018-08-27 DIAGNOSIS — W06XXXA Fall from bed, initial encounter: Secondary | ICD-10-CM | POA: Diagnosis not present

## 2018-08-27 DIAGNOSIS — R0902 Hypoxemia: Secondary | ICD-10-CM | POA: Diagnosis not present

## 2018-08-27 DIAGNOSIS — S0990XA Unspecified injury of head, initial encounter: Secondary | ICD-10-CM | POA: Diagnosis not present

## 2018-08-27 DIAGNOSIS — R42 Dizziness and giddiness: Secondary | ICD-10-CM | POA: Diagnosis not present

## 2018-08-27 DIAGNOSIS — R52 Pain, unspecified: Secondary | ICD-10-CM | POA: Diagnosis not present

## 2018-08-27 DIAGNOSIS — S199XXA Unspecified injury of neck, initial encounter: Secondary | ICD-10-CM | POA: Diagnosis not present

## 2018-08-27 DIAGNOSIS — S4991XA Unspecified injury of right shoulder and upper arm, initial encounter: Secondary | ICD-10-CM | POA: Diagnosis not present

## 2018-08-27 DIAGNOSIS — S42201A Unspecified fracture of upper end of right humerus, initial encounter for closed fracture: Secondary | ICD-10-CM | POA: Diagnosis not present

## 2018-08-27 DIAGNOSIS — M25519 Pain in unspecified shoulder: Secondary | ICD-10-CM | POA: Diagnosis not present

## 2018-08-27 DIAGNOSIS — R000 Tachycardia, unspecified: Secondary | ICD-10-CM | POA: Diagnosis not present

## 2018-08-27 DIAGNOSIS — F319 Bipolar disorder, unspecified: Secondary | ICD-10-CM | POA: Diagnosis not present

## 2018-08-27 DIAGNOSIS — S4981XA Other specified injuries of right shoulder and upper arm, initial encounter: Secondary | ICD-10-CM | POA: Diagnosis not present

## 2018-08-27 DIAGNOSIS — Z79899 Other long term (current) drug therapy: Secondary | ICD-10-CM | POA: Diagnosis not present

## 2018-08-27 DIAGNOSIS — M542 Cervicalgia: Secondary | ICD-10-CM | POA: Diagnosis not present

## 2018-09-01 DIAGNOSIS — S42211A Unspecified displaced fracture of surgical neck of right humerus, initial encounter for closed fracture: Secondary | ICD-10-CM | POA: Diagnosis not present

## 2018-09-09 ENCOUNTER — Ambulatory Visit (INDEPENDENT_AMBULATORY_CARE_PROVIDER_SITE_OTHER): Payer: Medicare Other | Admitting: Gastroenterology

## 2018-09-09 ENCOUNTER — Encounter: Payer: Self-pay | Admitting: Gastroenterology

## 2018-09-09 VITALS — BP 114/69 | HR 104 | Temp 97.6°F | Ht 67.0 in | Wt 198.6 lb

## 2018-09-09 DIAGNOSIS — R197 Diarrhea, unspecified: Secondary | ICD-10-CM

## 2018-09-09 NOTE — Assessment & Plan Note (Signed)
73 year old female with several year history of diarrhea, incontinence, s/p colonoscopy in 2017 unrevealing. Failed Bentyl. Stool studies and celiac serologies negative. Started on Viberzi 75 mg po BID with marked improvement and now with 1-2 soft bowel movements a day instead of 10-12. She is quite pleased and has increased quality of life now. Purposeful weight loss noted. Will have her return in 1 year or sooner if needed.

## 2018-09-09 NOTE — Patient Instructions (Signed)
Continue Viberzi twice a day with food.   I am so glad you are doing well!   Call if any abdominal pain, recurrent diarrhea, blood in stools, nausea, vomiting.  Keep up the great work with weight loss!  I enjoyed seeing you again today! As you know, I value our relationship and want to provide genuine, compassionate, and quality care. I welcome your feedback. If you receive a survey regarding your visit,  I greatly appreciate you taking time to fill this out. See you next time!  Annitta Needs, PhD, ANP-BC [facility]
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
## 2018-09-09 NOTE — Progress Notes (Signed)
cc'd to pcp 

## 2018-09-11 ENCOUNTER — Ambulatory Visit (INDEPENDENT_AMBULATORY_CARE_PROVIDER_SITE_OTHER): Payer: Medicare Other | Admitting: Psychiatry

## 2018-09-11 ENCOUNTER — Encounter: Payer: Self-pay | Admitting: Psychiatry

## 2018-09-11 DIAGNOSIS — F3181 Bipolar II disorder: Secondary | ICD-10-CM | POA: Diagnosis not present

## 2018-09-11 DIAGNOSIS — F411 Generalized anxiety disorder: Secondary | ICD-10-CM

## 2018-09-11 DIAGNOSIS — G2401 Drug induced subacute dyskinesia: Secondary | ICD-10-CM | POA: Diagnosis not present

## 2018-09-11 MED ORDER — OXCARBAZEPINE 300 MG PO TABS: 300.0000 mg | ORAL_TABLET | Freq: Two times a day (BID) | ORAL | 0 refills | Status: DC
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
## 2018-09-11 NOTE — Patient Instructions (Signed)
Reduce [name] to 40 mg daily for 2 weeks and then stop it.  Wait 1 to 2 weeks and then call the neurologist

## 2018-09-16 ENCOUNTER — Other Ambulatory Visit: Payer: Self-pay | Admitting: Psychiatry

## 2018-09-19 ENCOUNTER — Other Ambulatory Visit: Payer: Self-pay | Admitting: Gastroenterology

## 2018-09-29 ENCOUNTER — Telehealth: Payer: Self-pay | Admitting: Diagnostic Neuroimaging

## 2018-09-29 ENCOUNTER — Telehealth: Payer: Self-pay | Admitting: Psychiatry

## 2018-09-29 NOTE — Telephone Encounter (Signed)
The neurologist wanted her to come off of [name].  He wanted her to be reevaluated off the [place].  If her symptoms are worse it is because of stopping the [name].  However given this is what the neurologist wanted, she needs to contact the neurologist to ask about the next step.  Consideration could be given to the medication Dillard Essex, MD, DFAPA

## 2018-09-29 NOTE — Telephone Encounter (Signed)
Pt called to say the her tardive dyskenesia symptoms are not any better on the current medication. Mouth movements still there and her jaw is tight.  Needs to try a different med.

## 2018-09-29 NOTE — Telephone Encounter (Signed)
Spoke to pt.  She had been in ingrezza 120mg  po for 5 months, then decreased to 80mg  for a month, then 40mg  po for 2 wks, then off last Friday 09-26-18.  No change in sx.  Asking about increase of CL 25/100 now on 1 tab TID.  (titrate?)

## 2018-09-29 NOTE — Telephone Encounter (Signed)
Please advise 

## 2018-09-29 NOTE — Telephone Encounter (Signed)
Pt states she was to inform us once she is finally off of her [place]. She was on 80mg , went down to 40mg  and a week later was completely off of the medication. She would like to know when she can start the new medication. Please advise.

## 2018-09-30 NOTE — Telephone Encounter (Signed)
Spoke to pt after consulting with Dr. Leta Baptist.  She is to start taking CL 25/100 2 tabs po BID.  Do this for 2 wks and see if this makes difference.  Call us to let us know.  If no change, then may take the austedo  along with CL.  Pt will check with her pharmacy and see if needs new prescription then can electronically send [place] request if needed.  She verbalized understanding.

## 2018-09-30 NOTE — Telephone Encounter (Signed)
Pt is waiting for neurologist to call back today, she will call back with what they recommend. Symptoms aren't worse since stopping [name] but no better since stopping.

## 2018-09-30 NOTE — Telephone Encounter (Signed)
Pt said Dr Cottle/psych is wanting to start her on Austedl for the movements. She is wanting to know if Dr Mamie Nick approves of this. Pt said her tongue is constantly moving and her feet. Please call to advise

## 2018-10-01 DIAGNOSIS — S42211A Unspecified displaced fracture of surgical neck of right humerus, initial encounter for closed fracture: Secondary | ICD-10-CM | POA: Diagnosis not present

## 2018-10-04 DIAGNOSIS — M62838 Other muscle spasm: Secondary | ICD-10-CM | POA: Diagnosis not present

## 2018-10-04 DIAGNOSIS — G20 Parkinson's disease: Secondary | ICD-10-CM | POA: Diagnosis not present

## 2018-10-04 DIAGNOSIS — F319 Bipolar disorder, unspecified: Secondary | ICD-10-CM | POA: Diagnosis not present

## 2018-10-04 DIAGNOSIS — R202 Paresthesia of skin: Secondary | ICD-10-CM | POA: Diagnosis not present

## 2018-10-04 DIAGNOSIS — R6884 Jaw pain: Secondary | ICD-10-CM | POA: Diagnosis not present

## 2018-10-04 DIAGNOSIS — E079 Disorder of thyroid, unspecified: Secondary | ICD-10-CM | POA: Diagnosis not present

## 2018-10-04 DIAGNOSIS — Z79899 Other long term (current) drug therapy: Secondary | ICD-10-CM | POA: Diagnosis not present

## 2018-10-04 DIAGNOSIS — R29810 Facial weakness: Secondary | ICD-10-CM | POA: Diagnosis not present

## 2018-10-06 ENCOUNTER — Ambulatory Visit (INDEPENDENT_AMBULATORY_CARE_PROVIDER_SITE_OTHER): Payer: Medicare Other | Admitting: Psychiatry

## 2018-10-06 ENCOUNTER — Ambulatory Visit: Payer: Medicare Other | Admitting: Diagnostic Neuroimaging

## 2018-10-06 ENCOUNTER — Encounter: Payer: Self-pay | Admitting: Diagnostic Neuroimaging

## 2018-10-06 ENCOUNTER — Other Ambulatory Visit: Payer: Self-pay | Admitting: Gastroenterology

## 2018-10-06 ENCOUNTER — Encounter: Payer: Self-pay | Admitting: Psychiatry

## 2018-10-06 DIAGNOSIS — F3181 Bipolar II disorder: Secondary | ICD-10-CM | POA: Diagnosis not present

## 2018-10-06 DIAGNOSIS — G2401 Drug induced subacute dyskinesia: Secondary | ICD-10-CM | POA: Diagnosis not present

## 2018-10-06 DIAGNOSIS — F411 Generalized anxiety disorder: Secondary | ICD-10-CM | POA: Diagnosis not present

## 2018-10-06 MED ORDER — VALBENAZINE TOSYLATE 40 MG PO CAPS: 1.0000 | ORAL_CAPSULE | Freq: Every day | ORAL | 5 refills | Status: DC

## 2018-10-06 MED ORDER — VALBENAZINE TOSYLATE 80 MG PO CAPS: 80.0000 mg | ORAL_CAPSULE | Freq: Every day | ORAL | 5 refills | Status: DC

## 2018-10-06 MED ORDER — DIAZEPAM 5 MG PO TABS: 5.0000 mg | ORAL_TABLET | Freq: Three times a day (TID) | ORAL | 0 refills | Status: DC | PRN

## 2018-10-06 NOTE — Progress Notes (Signed)
Annette Johnston [id number] 1946-02-08 73 y.o.  Subjective:   Patient ID:  Annette Johnston is a 73 y.o. (DOB 11/24/1945) female.  Chief Complaint:  Chief Complaint  Patient presents with  . worsening tardive dyskinesia  . Anxiety    HPI  Annette Johnston presents to the office today for follow-up of worsening TD.  She just walked in asking to be seen urgently.  Tardive dyskinesia symptoms have worsened markedly since stopping the [name].  She is miserable.  She went to see Dr. Lonni Fix neurology today and he agreed that she was worse and suggested that she restart the [place] or [place].  Her symptoms include worsening tongue movements and sensation of locked jaw and jaw pain, worsening tremor, nausea, worsening balance, increased leg movements at rest.  Her symptoms cleared completely resolve when she is asleep.  However during the day she is highly anxious and disturbed by these symptoms and finds them intolerable.  She went to the emergency room 2 nights ago and they gave her Robaxin for muscle spasms.  That has had limited benefit.  She received lorazepam at the neurologist office today because of her anxiety and agitation.  She has not noted much effect from that.   Review of Systems:  Review of Systems  Gastrointestinal: Positive for nausea.  Musculoskeletal: Positive for back pain.  Neurological: Positive for dizziness, tremors and weakness.  Psychiatric/Behavioral: Positive for agitation, decreased concentration and dysphoric mood. Negative for behavioral problems, confusion, hallucinations, self-injury, sleep disturbance and suicidal ideas. The patient is nervous/anxious. The patient is not hyperactive.     Medications: I have reviewed the patient's current medications.  Current Outpatient Medications  Medication Sig Dispense Refill  . carbidopa-levodopa (SINEMET IR) 25-100 MG tablet Take 1-2 tablets by mouth 3 (three) times daily before meals. 180 tablet 12  . dicyclomine  (BENTYL) 10 MG capsule TAKE 1 CAPSULE (10 MG TOTAL) BY MOUTH 4 (FOUR) TIMES DAILY - BEFORE MEALS AND AT BEDTIME. 360 capsule 1  . diphenhydramine-acetaminophen (TYLENOL PM) 25-500 MG TABS tablet Take 1 tablet by mouth at bedtime as needed.    Marland Kitchen levothyroxine (SYNTHROID, LEVOTHROID) 137 MCG tablet Take 137 mcg by mouth daily before breakfast.    . Multiple Vitamin (MULTIVITAMIN) tablet Take 1 tablet by mouth daily.    . Oxcarbazepine (TRILEPTAL) 300 MG tablet Take 1 tablet (300 mg total) by mouth 2 (two) times daily. 180 tablet 0  . PARoxetine (PAXIL) 30 MG tablet TAKE 1 TABLET BY MOUTH EVERY DAY 90 tablet 0  . VIBERZI 75 MG TABS TAKE 1 TABLET BY MOUTH 2 (TWO) TIMES DAILY WITH A MEAL. 60 tablet 3  . diazepam (VALIUM) 5 MG tablet Take 1 tablet (5 mg total) by mouth every 8 (eight) hours as needed for anxiety. (Patient not taking: Reported on 10/06/2018) 60 tablet 0  . Valbenazine Tosylate ([name]) 40 MG CAPS Take 1 capsule by mouth daily. (Patient not taking: Reported on 10/06/2018) 30 capsule 5  . Valbenazine Tosylate ([name]) 80 MG CAPS Take 80 mg by mouth daily. (Patient not taking: Reported on 10/06/2018) 30 capsule 5   No current facility-administered medications for this visit.     Medication Side Effects: None  Allergies:  Allergies  Allergen Reactions  . Latex Rash    Bandaids    Past Medical History:  Diagnosis Date  . Anxiety   . Bipolar 1 disorder ([place])   . Depression   . Dyspnea on exertion july 2011   Normal stress test  dec 2010. Reportedl normal CXR 2011. CT abdomen lung cut 2005  - normal. Normal PFT - march 2012  . Hypothyroidism   . Obesity (BMI 30-39.9)    BMI 36 Feb 2012  . Urticaria     Family History  Problem Relation Age of Onset  . Alzheimer's disease Mother   . Heart attack Father   . Coronary artery disease Brother   . Heart attack Brother   . Alzheimer's disease Paternal Grandmother   . Allergic rhinitis Neg Hx   . Angioedema Neg Hx   . Asthma Neg  Hx   . Atopy Neg Hx   . Eczema Neg Hx   . Immunodeficiency Neg Hx   . Urticaria Neg Hx     Social History   Socioeconomic History  . Marital status: Married    Spouse name: [name]  . Number of children: 1  . Years of education: Not on file  . Highest education level: High school graduate  Occupational History  . Occupation: retired  Scientific laboratory technician  . Financial resource strain: Not on file  . Food insecurity:    Worry: Not on file    Inability: Not on file  . Transportation needs:    Medical: Not on file    Non-medical: Not on file  Tobacco Use  . Smoking status: Never Smoker  . Smokeless tobacco: Never Used  Substance and Sexual Activity  . Alcohol use: Not Currently  . Drug use: No  . Sexual activity: Yes    Birth control/protection: Surgical  Lifestyle  . Physical activity:    Days per week: Not on file    Minutes per session: Not on file  . Stress: Not on file  Relationships  . Social connections:    Talks on phone: Not on file    Gets together: Not on file    Attends religious service: Not on file    Active member of club or organization: Not on file    Attends meetings of clubs or organizations: Not on file    Relationship status: Not on file  . Intimate partner violence:    Fear of current or ex partner: Not on file    Emotionally abused: Not on file    Physically abused: Not on file    Forced sexual activity: Not on file  Other Topics Concern  . Not on file  Social History Narrative   Caffeine 1 cup per day    Lives at home with husband    Right Handed.     Past Medical History, Surgical history, Social history, and Family history were reviewed and updated as appropriate.   Please see review of systems for further details on the patient's review from today.   Objective:   Physical Exam:  There were no vitals taken for this visit.  Physical Exam Neurological:     Motor: No tremor.     Gait: Gait normal.  Psychiatric:        Attention and  Perception: She is attentive.        Mood and Affect: Mood is anxious and depressed. Affect is labile.        Speech: Speech normal. Speech is not rapid and pressured or slurred.        Behavior: Behavior is agitated and hyperactive. Behavior is not combative. Behavior is cooperative.        Thought Content: Thought content is not paranoid. Thought content does not include homicidal or suicidal ideation.  Cognition and Memory: She exhibits impaired recent memory.     Comments: Insight and judgment fair at best. Very distressed over worsening movement disorder.     Lab Review:     Component Value Date/Time   NA 137 12/17/2017 1056   K 4.8 12/17/2017 1056   CL 98 12/17/2017 1056   CO2 23 12/17/2017 1056   GLUCOSE 89 12/17/2017 1056   GLUCOSE 87 06/06/2016 0959   BUN 15 12/17/2017 1056   CREATININE 0.64 12/17/2017 1056   CALCIUM 9.4 12/17/2017 1056   PROT 7.3 12/17/2017 1056   ALBUMIN 4.2 12/17/2017 1056   AST 30 12/17/2017 1056   ALT 26 12/17/2017 1056   ALKPHOS 77 12/17/2017 1056   BILITOT <0.2 12/17/2017 1056   GFRNONAA 90 12/17/2017 1056   GFRAA 104 12/17/2017 1056       Component Value Date/Time   WBC 6.7 12/17/2017 1056   WBC 6.1 06/06/2016 0959   RBC 4.27 12/17/2017 1056   RBC 4.64 06/06/2016 0959   HGB 13.1 12/17/2017 1056   HCT 38.2 12/17/2017 1056   PLT 329 12/17/2017 1056   MCV 90 12/17/2017 1056   MCH 30.7 12/17/2017 1056   MCH 30.0 06/06/2016 0959   MCHC 34.3 12/17/2017 1056   MCHC 33.7 06/06/2016 0959   RDW 14.0 12/17/2017 1056   LYMPHSABS 1.6 12/17/2017 1056   MONOABS 0.5 06/06/2016 0959   EOSABS 0.3 12/17/2017 1056   BASOSABS 0.0 12/17/2017 1056    No results found for: POCLITH, LITHIUM   No results found for: PHENYTOIN, PHENOBARB, VALPROATE, CBMZ   .res Assessment: Plan:    Tardive dyskinesia - Plan: Valbenazine Tosylate ([name]) 80 MG CAPS, Valbenazine Tosylate ([name]) 40 MG CAPS  Bipolar II disorder ([place])  Generalized anxiety  disorder   Discussed case with Dr. Corwin Levins, neurology, while patient was in the room.  We discussed her worsening symptoms while off of the [name].  He mentioned her D AT scan was suggestive of part possible parkinsonism and that is in part why he recommended discontinuing the [name].  Now upon seeing her, she walked into his office today, she is clearly much worse.  He agrees with the assessment of tardive dyskinesia and that she needs to restart [name] or else Austedo.  She is indeed much worse with her movement disorder of the [name].  She is very distressed by this and feels the symptoms are intolerable.  Therefore we will restart the [name] at 80 mg a day for 3 days and then 120 mg a day which she took before.  There is a drug to drug interaction with the oxcarbazepine that increases the clearance of [name] requiring a higher than usual dose.  She tolerated this higher dosage before and it benefited from.  She admits down that she was mistaken when she stated that she did not benefit from the [name].  We will consider transition to [place] at some future point should we feel there is further improvement to be gained potentially in such a move.  However that would take time and she needs to improve as fast as possible.  She was not sedated from the [place] so we will start it back at the higher dosage.  Samples were given to speed the process.  The prescription was reordered.  We will not change the other medications at this time with the exception of switching from lorazepam to diazepam in hopes of more muscle relaxant activity which could further ease her distress also by decreasing  her anxiety.  We discussed the short-term risks associated with benzodiazepines including sedation and increased fall risk among others.  Discussed long-term side effect risk including dependence, potential withdrawal symptoms, and the potential eventual dose-related risk of dementia.  Was a 40-minute  appointment  Follow-up as scheduled in about 3 weeks.  Call if there are problems with the transition.  Lynder Parents, MD, DFAPA  Please see After Visit Summary for patient specific instructions.  Future Appointments  Date Time Provider [place]  10/23/2018  3:15 PM Cottle, Billey Co., MD [facility] None  12/10/2018  2:30 PM Penumalli, Earlean Polka, MD [facility] None    No orders of the defined types were placed in this encounter.     -------------------------------

## 2018-10-06 NOTE — Progress Notes (Signed)
Patient walked into clinic without appointment today, complaining of muscle spasms, tremors, uncontrollable movements of mouth, tongue, arms and legs.  Symptoms started last week.  Now patient is off ingrezza.  She is on carbidopa levodopa 2 tablets 3 times a day.  Patient also reports increasing stress and anxiety over the last 3 to 4 weeks. She went to [facility] recently and was treated with ativan IV which help her.  On exam patient has continuous mouth and tongue oral dyskinesias, akathisia and rocking movements of her legs, and appears very anxious. She is crying.  She is here with her husband.  She is in a wheelchair.   Dx: tardive dyskinesia + akathisia (severe) + parkinson's disease (mild)   RECOMMENDATIONS: - reduce carb/levo to 1 tab three times a day - follow up with Dr. Clovis Pu re: restarting ingrezza or Vilma Prader, MD 3/72/9021, 1:15 PM Certified in Neurology, Neurophysiology and Neuroimaging  [facility] [address], [place], [place] [address] [phone]

## 2018-10-06 NOTE — Patient Instructions (Addendum)
Start [name] 80 mg daily for 3 days, then add 80 and 40 mg capsules daily.  Stop lorazepam and use Valium (diazepam) 1 tablet 3 times daily as needed for anxiety and sleep.

## 2018-10-08 DIAGNOSIS — M25511 Pain in right shoulder: Secondary | ICD-10-CM | POA: Diagnosis not present

## 2018-10-08 DIAGNOSIS — S42211D Unspecified displaced fracture of surgical neck of right humerus, subsequent encounter for fracture with routine healing: Secondary | ICD-10-CM | POA: Diagnosis not present

## 2018-10-10 DIAGNOSIS — M25511 Pain in right shoulder: Secondary | ICD-10-CM | POA: Diagnosis not present

## 2018-10-10 DIAGNOSIS — S42211D Unspecified displaced fracture of surgical neck of right humerus, subsequent encounter for fracture with routine healing: Secondary | ICD-10-CM | POA: Diagnosis not present

## 2018-10-15 DIAGNOSIS — H6123 Impacted cerumen, bilateral: Secondary | ICD-10-CM | POA: Diagnosis not present

## 2018-10-15 DIAGNOSIS — M25511 Pain in right shoulder: Secondary | ICD-10-CM | POA: Diagnosis not present

## 2018-10-15 DIAGNOSIS — F411 Generalized anxiety disorder: Secondary | ICD-10-CM | POA: Diagnosis not present

## 2018-10-15 DIAGNOSIS — G2401 Drug induced subacute dyskinesia: Secondary | ICD-10-CM | POA: Diagnosis not present

## 2018-10-15 DIAGNOSIS — Z6830 Body mass index (BMI) 30.0-30.9, adult: Secondary | ICD-10-CM | POA: Diagnosis not present

## 2018-10-15 DIAGNOSIS — S42211D Unspecified displaced fracture of surgical neck of right humerus, subsequent encounter for fracture with routine healing: Secondary | ICD-10-CM | POA: Diagnosis not present

## 2018-10-17 DIAGNOSIS — M25511 Pain in right shoulder: Secondary | ICD-10-CM | POA: Diagnosis not present

## 2018-10-17 DIAGNOSIS — S42211D Unspecified displaced fracture of surgical neck of right humerus, subsequent encounter for fracture with routine healing: Secondary | ICD-10-CM | POA: Diagnosis not present

## 2018-10-22 DIAGNOSIS — M25511 Pain in right shoulder: Secondary | ICD-10-CM | POA: Diagnosis not present

## 2018-10-22 DIAGNOSIS — S42211D Unspecified displaced fracture of surgical neck of right humerus, subsequent encounter for fracture with routine healing: Secondary | ICD-10-CM | POA: Diagnosis not present

## 2018-10-23 ENCOUNTER — Other Ambulatory Visit: Payer: Self-pay

## 2018-10-23 ENCOUNTER — Ambulatory Visit (INDEPENDENT_AMBULATORY_CARE_PROVIDER_SITE_OTHER): Payer: Medicare Other | Admitting: Psychiatry

## 2018-10-23 ENCOUNTER — Encounter: Payer: Self-pay | Admitting: Psychiatry

## 2018-10-23 DIAGNOSIS — G3184 Mild cognitive impairment, so stated: Secondary | ICD-10-CM

## 2018-10-23 DIAGNOSIS — F3162 Bipolar disorder, current episode mixed, moderate: Secondary | ICD-10-CM

## 2018-10-23 DIAGNOSIS — F411 Generalized anxiety disorder: Secondary | ICD-10-CM | POA: Diagnosis not present

## 2018-10-23 DIAGNOSIS — G2401 Drug induced subacute dyskinesia: Secondary | ICD-10-CM

## 2018-10-23 NOTE — Progress Notes (Signed)
Annette Johnston [id number] [birth year] 73 y.o.  Subjective:   Patient ID:  Annette Johnston is a 73 y.o. (DOB [birth year]) female.  Chief Complaint:  Chief Complaint  Patient presents with  . Follow-up    Medication Management  . Medication Problem    TD  . Anxiety  . Agitation    HPI  Annette Johnston presents to the office today for follow-up of recent worsening TD after stopping [name]  Discussed case with Dr. Corwin Levins, neurology, while patient was in the room at the last visit.  We discussed her worsening symptoms while off of the [name].  He mentioned her D AT scan was suggestive of part possible parkinsonism and that is in part why he recommended discontinuing the [name].  Now upon seeing her, she walked into his office today, she is clearly much worse.  He agrees with the assessment of tardive dyskinesia and that she needs to restart [name] or else [place].  She  indeed much worse with her movement disorder of the [name].  She was very distressed by this and feels the symptoms are intolerable.  Therefore we  restarted the [name] at 80 mg a day for 3 days and then 120 mg a day which she took before.  There is a drug to drug interaction with the oxcarbazepine that increases the clearance of [name] requiring a higher than usual dose.  She tolerated this higher dosage before and it benefited from.  She admits down that she was mistaken when she stated that she did not benefit from the [name].  Better back on the [name].  Occ shakes more and anxious and agitated with renters.  But clearly the Julio Alm is helping.  Recognizes easily agitated.  Doesn't remember if the Equetro worked better than oxcarbazepine for that or not.  In bed a lot. 12 hours.  Sleeps to escape renters.  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  Not doing much daytime except dishes and clothes. Arm is better.  Therpay for arm injury.  Some nervousness.  Still some degree of depression.  Energy and motivation are  not that good.  She is not severely sad.  She can enjoy some things.   Past Psychiatric Medication Trials: Depakote jerks, lithium tremor, risperidone side effects, Seroquel 300, remote history of ECT, Latuda, [name], paroxetine 30 for panic, Equetro good response, oxcarbazepine partial response, Saphris, lorazepam, sertraline, [name] partial response   Review of Systems:  Review of Systems  Gastrointestinal: Negative for nausea.  Musculoskeletal: Positive for arthralgias. Negative for back pain.  Neurological: Positive for tremors and weakness. Negative for dizziness.  Psychiatric/Behavioral: Positive for agitation, decreased concentration and dysphoric mood. Negative for behavioral problems, confusion, hallucinations, self-injury, sleep disturbance and suicidal ideas. The patient is nervous/anxious. The patient is not hyperactive.     Medications: I have reviewed the patient's current medications.  Current Outpatient Medications  Medication Sig Dispense Refill  . carbidopa-levodopa (SINEMET IR) 25-100 MG tablet Take 1-2 tablets by mouth 3 (three) times daily before meals. 180 tablet 12  . diazepam (VALIUM) 5 MG tablet Take 1 tablet (5 mg total) by mouth every 8 (eight) hours as needed for anxiety. 60 tablet 0  . dicyclomine (BENTYL) 10 MG capsule TAKE 1 CAPSULE (10 MG TOTAL) BY MOUTH 4 (FOUR) TIMES DAILY - BEFORE MEALS AND AT BEDTIME. 360 capsule 1  . diphenhydramine-acetaminophen (TYLENOL PM) 25-500 MG TABS tablet Take 1 tablet by mouth at bedtime as needed.    Marland Kitchen levothyroxine (SYNTHROID, LEVOTHROID) 137 MCG  tablet Take 137 mcg by mouth daily before breakfast.    . Multiple Vitamin (MULTIVITAMIN) tablet Take 1 tablet by mouth daily.    . Oxcarbazepine (TRILEPTAL) 300 MG tablet Take 1 tablet (300 mg total) by mouth 2 (two) times daily. 180 tablet 0  . PARoxetine (PAXIL) 30 MG tablet TAKE 1 TABLET BY MOUTH EVERY DAY (Patient taking differently: 15 mg. ) 90 tablet 0  . Valbenazine  Tosylate ([name]) 40 MG CAPS Take 1 capsule by mouth daily. 30 capsule 5  . Valbenazine Tosylate ([name]) 80 MG CAPS Take 80 mg by mouth daily. 30 capsule 5  . VIBERZI 75 MG TABS TAKE 1 TABLET BY MOUTH 2 (TWO) TIMES DAILY WITH A MEAL. 60 tablet 3   No current facility-administered medications for this visit.     Medication Side Effects: None  Allergies:  Allergies  Allergen Reactions  . Latex Rash    Bandaids    Past Medical History:  Diagnosis Date  . Anxiety   . Bipolar 1 disorder ([place])   . Depression   . Dyspnea on exertion july 2011   Normal stress test dec 2010. Reportedl normal CXR 2011. CT abdomen lung cut 2005  - normal. Normal PFT - march 2012  . Hypothyroidism   . Obesity (BMI 30-39.9)    BMI 36 Feb 2012  . Urticaria     Family History  Problem Relation Age of Onset  . Alzheimer's disease Mother   . Heart attack Father   . Coronary artery disease Brother   . Heart attack Brother   . Alzheimer's disease Paternal Grandmother   . Allergic rhinitis Neg Hx   . Angioedema Neg Hx   . Asthma Neg Hx   . Atopy Neg Hx   . Eczema Neg Hx   . Immunodeficiency Neg Hx   . Urticaria Neg Hx     Social History   Socioeconomic History  . Marital status: Married    Spouse name: [name]  . Number of children: 1  . Years of education: Not on file  . Highest education level: High school graduate  Occupational History  . Occupation: retired  Scientific laboratory technician  . Financial resource strain: Not on file  . Food insecurity:    Worry: Not on file    Inability: Not on file  . Transportation needs:    Medical: Not on file    Non-medical: Not on file  Tobacco Use  . Smoking status: Never Smoker  . Smokeless tobacco: Never Used  Substance and Sexual Activity  . Alcohol use: Not Currently  . Drug use: No  . Sexual activity: Yes    Birth control/protection: Surgical  Lifestyle  . Physical activity:    Days per week: Not on file    Minutes per session: Not on file  .  Stress: Not on file  Relationships  . Social connections:    Talks on phone: Not on file    Gets together: Not on file    Attends religious service: Not on file    Active member of club or organization: Not on file    Attends meetings of clubs or organizations: Not on file    Relationship status: Not on file  . Intimate partner violence:    Fear of current or ex partner: Not on file    Emotionally abused: Not on file    Physically abused: Not on file    Forced sexual activity: Not on file  Other Topics Concern  .  Not on file  Social History Narrative   Caffeine 1 cup per day    Lives at home with husband    Right Handed.     Past Medical History, Surgical history, Social history, and Family history were reviewed and updated as appropriate.   Please see review of systems for further details on the patient's review from today.   Objective:   Physical Exam:  There were no vitals taken for this visit.  Physical Exam Neurological:     Motor: Tremor present.     Gait: Gait normal.     Comments: She has a mild to moderate rotational tremor of her right arm greater than left.  She has mild/moderate tongue movements at rest.  Her speech is clear and not slurred.  Psychiatric:        Attention and Perception: She is attentive. She does not perceive auditory or visual hallucinations.        Mood and Affect: Mood is anxious and depressed. Mood is not elated. Affect is inappropriate. Affect is not labile or angry.        Speech: Speech normal. Speech is not rapid and pressured or slurred.        Behavior: Behavior is not agitated, hyperactive or combative. Behavior is cooperative.        Thought Content: Thought content is not paranoid. Thought content does not include homicidal or suicidal ideation.        Cognition and Memory: She exhibits impaired recent memory.     Comments: Insight and judgment fair but better than last visit. The acute distress she had at last visit has resolved.   Her anxiety and depression are improved but she still has some as well as some irritability she reports but not in the office     Lab Review:     Component Value Date/Time   NA 137 12/17/2017 1056   K 4.8 12/17/2017 1056   CL 98 12/17/2017 1056   CO2 23 12/17/2017 1056   GLUCOSE 89 12/17/2017 1056   GLUCOSE 87 06/06/2016 0959   BUN 15 12/17/2017 1056   CREATININE 0.64 12/17/2017 1056   CALCIUM 9.4 12/17/2017 1056   PROT 7.3 12/17/2017 1056   ALBUMIN 4.2 12/17/2017 1056   AST 30 12/17/2017 1056   ALT 26 12/17/2017 1056   ALKPHOS 77 12/17/2017 1056   BILITOT <0.2 12/17/2017 1056   GFRNONAA 90 12/17/2017 1056   GFRAA 104 12/17/2017 1056       Component Value Date/Time   WBC 6.7 12/17/2017 1056   WBC 6.1 06/06/2016 0959   RBC 4.27 12/17/2017 1056   RBC 4.64 06/06/2016 0959   HGB 13.1 12/17/2017 1056   HCT 38.2 12/17/2017 1056   PLT 329 12/17/2017 1056   MCV 90 12/17/2017 1056   MCH 30.7 12/17/2017 1056   MCH 30.0 06/06/2016 0959   MCHC 34.3 12/17/2017 1056   MCHC 33.7 06/06/2016 0959   RDW 14.0 12/17/2017 1056   LYMPHSABS 1.6 12/17/2017 1056   MONOABS 0.5 06/06/2016 0959   EOSABS 0.3 12/17/2017 1056   BASOSABS 0.0 12/17/2017 1056    No results found for: POCLITH, LITHIUM   No results found for: PHENYTOIN, PHENOBARB, VALPROATE, CBMZ   .res Assessment: Plan:    Tardive dyskinesia  Bipolar 1 disorder, mixed, moderate (HCC)  Generalized anxiety disorder  Mild cognitive impairment   Greater than 50% of face to face time with patient was spent on counseling and coordination of care. We discussed Ms.  Blomgren is a very complicated and difficult to treat bipolar patient with tardive dyskinesia.  It is complicated by the presence as well of mild cognitive impairment.  In addition complicated by multiple med failures and very limited options for mood stabilization.  The option that we are using still has a risk of drug drug interactions with the medication for tardive  dyskinesia.  Her tardive dyskinesia symptoms are much improved back on the [name] 120 mg daily.  The high dose is required because of the presence of oxcarbazepine which induces metabolism of [name].  Discussed this drug interaction.  She does not want to change the medication because she is so relieved to have improvement.  She still has tremor and some tongue movement but it is much improved.  She is able to function adequately given her arm injury.  She still has some mixed symptoms with irritability and some degree of depression.  Consider increasing oxcarbazepine but given how distressed she was at the prior visit and relieved that she is at this visit it seems prudent to defer further medication changes because of the risk of causing side effects.  No med changes today..  We will consider transition to [place] at some future point should we feel there is further improvement to be gained potentially in such a move.  However that would take time and she needs to improve as fast as possible.  She was not sedated from the [place] so we will start it back at the higher dosage.  Samples were given to speed the process.  The prescription was reordered.   We discussed the short-term risks associated with benzodiazepines including sedation and increased fall risk among others.  Discussed long-term side effect risk including dependence, potential withdrawal symptoms, and the potential eventual dose-related risk of dementia.  Was a 30-minute appointment  Follow-up as scheduled in about 3 weeks.  Call if there are problems with the transition.  Lynder Parents, MD, DFAPA  Please see After Visit Summary for patient specific instructions.  Future Appointments  Date Time Provider [place]  12/10/2018  2:30 PM [name], MD [facility] None  12/23/2018  3:15 PM Cottle, Billey Co., MD [facility] None    No orders of the defined types were placed in this encounter.      -------------------------------

## 2018-10-24 DIAGNOSIS — S42211D Unspecified displaced fracture of surgical neck of right humerus, subsequent encounter for fracture with routine healing: Secondary | ICD-10-CM | POA: Diagnosis not present

## 2018-10-24 DIAGNOSIS — M25511 Pain in right shoulder: Secondary | ICD-10-CM | POA: Diagnosis not present

## 2018-10-29 DIAGNOSIS — S42211D Unspecified displaced fracture of surgical neck of right humerus, subsequent encounter for fracture with routine healing: Secondary | ICD-10-CM | POA: Diagnosis not present

## 2018-10-29 DIAGNOSIS — M25511 Pain in right shoulder: Secondary | ICD-10-CM | POA: Diagnosis not present

## 2018-10-31 DIAGNOSIS — M25511 Pain in right shoulder: Secondary | ICD-10-CM | POA: Diagnosis not present

## 2018-10-31 DIAGNOSIS — S42211D Unspecified displaced fracture of surgical neck of right humerus, subsequent encounter for fracture with routine healing: Secondary | ICD-10-CM | POA: Diagnosis not present

## 2018-11-03 DIAGNOSIS — S42211D Unspecified displaced fracture of surgical neck of right humerus, subsequent encounter for fracture with routine healing: Secondary | ICD-10-CM | POA: Diagnosis not present

## 2018-11-03 DIAGNOSIS — M25511 Pain in right shoulder: Secondary | ICD-10-CM | POA: Diagnosis not present

## 2018-11-05 DIAGNOSIS — M25511 Pain in right shoulder: Secondary | ICD-10-CM | POA: Diagnosis not present

## 2018-11-05 DIAGNOSIS — S42211D Unspecified displaced fracture of surgical neck of right humerus, subsequent encounter for fracture with routine healing: Secondary | ICD-10-CM | POA: Diagnosis not present

## 2018-11-21 ENCOUNTER — Other Ambulatory Visit: Payer: Self-pay | Admitting: Psychiatry

## 2018-11-22 ENCOUNTER — Other Ambulatory Visit: Payer: Self-pay | Admitting: Diagnostic Neuroimaging

## 2018-11-27 ENCOUNTER — Telehealth: Payer: Self-pay | Admitting: *Deleted

## 2018-11-27 NOTE — Telephone Encounter (Signed)
LVM advising due to current COVID 19 pandemic, our office is severely reducing in person visits in order to minimize the risk to our patients and healthcare providers. We recommend to convert your appointment to a video visit. Requested call back to discuss.

## 2018-12-03 DIAGNOSIS — S42211D Unspecified displaced fracture of surgical neck of right humerus, subsequent encounter for fracture with routine healing: Secondary | ICD-10-CM | POA: Diagnosis not present

## 2018-12-03 NOTE — Telephone Encounter (Addendum)
Spoke with patient to discuss video visit. She stated she does not have capability and no family member to help her. She consented to a telephone follow up with Dr Colen Darling. She was unable to update EMR at this time. Will call her back.

## 2018-12-04 ENCOUNTER — Other Ambulatory Visit: Payer: Self-pay | Admitting: Psychiatry

## 2018-12-08 NOTE — Telephone Encounter (Signed)
Spoke with patient and updated EMR. 

## 2018-12-08 NOTE — Telephone Encounter (Signed)
Pt called back. Please call back as soon as available.

## 2018-12-08 NOTE — Telephone Encounter (Signed)
Called patient to update EMR; she asked to call back in one hour. I confirmed she has our office number.

## 2018-12-10 ENCOUNTER — Ambulatory Visit (INDEPENDENT_AMBULATORY_CARE_PROVIDER_SITE_OTHER): Payer: Medicare Other | Admitting: Diagnostic Neuroimaging

## 2018-12-10 DIAGNOSIS — G20 Parkinson's disease: Secondary | ICD-10-CM

## 2018-12-10 DIAGNOSIS — G2401 Drug induced subacute dyskinesia: Secondary | ICD-10-CM

## 2018-12-10 NOTE — Progress Notes (Signed)
     Virtual Visit via Telephone Note  I connected with@ on 12/10/18 at  2:30 PM EDT by telephone and verified that I am speaking with the correct person using two identifiers.   I discussed the limitations, risks, security and privacy concerns of performing an evaluation and management service by telephone and the availability of in person appointments. I also discussed with the patient that there may be a patient responsible charge related to this service. The patient expressed understanding and agreed to proceed. Patient is at home and I am at the office.   History of Present Illness:  - patient is doing well. Movements much improved; anxiety much improved; tolerating meds. - now back on ingrezza - continues on carb / levo    Observations/Objective:  - awake and alert   Assessment and Plan:  73 y.o. female with:   Dx: tardive dyskinesia + parkinson's disease (mild)   RECOMMENDATIONS: - continue carb/levo 1 tab three times a day - follow up with Dr. Clovis Pu re: ingrezza   Follow Up Instructions:  - Return in about 1 year (around 12/10/2019).    I discussed the assessment and treatment plan with the patient. The patient was provided an opportunity to ask questions and all were answered. The patient agreed with the plan and demonstrated an understanding of the instructions.   The patient was advised to call back or seek an in-person evaluation if the symptoms worsen or if the condition fails to improve as anticipated.  I provided 15 minutes of non-face-to-face time during this encounter.    [name], MD 0/17/7939, 0:30 PM Certified in Neurology, Neurophysiology and Neuroimaging  [facility] [address], [place], [place] [address] [phone]

## 2018-12-16 ENCOUNTER — Other Ambulatory Visit: Payer: Self-pay | Admitting: Psychiatry

## 2018-12-23 ENCOUNTER — Other Ambulatory Visit: Payer: Self-pay

## 2018-12-23 ENCOUNTER — Ambulatory Visit (INDEPENDENT_AMBULATORY_CARE_PROVIDER_SITE_OTHER): Payer: Medicare Other | Admitting: Psychiatry

## 2018-12-23 ENCOUNTER — Encounter: Payer: Self-pay | Admitting: Psychiatry

## 2018-12-23 DIAGNOSIS — F411 Generalized anxiety disorder: Secondary | ICD-10-CM | POA: Diagnosis not present

## 2018-12-23 DIAGNOSIS — G3184 Mild cognitive impairment, so stated: Secondary | ICD-10-CM | POA: Diagnosis not present

## 2018-12-23 DIAGNOSIS — F4001 Agoraphobia with panic disorder: Secondary | ICD-10-CM

## 2018-12-23 DIAGNOSIS — G2401 Drug induced subacute dyskinesia: Secondary | ICD-10-CM

## 2018-12-23 DIAGNOSIS — F3131 Bipolar disorder, current episode depressed, mild: Secondary | ICD-10-CM

## 2018-12-23 NOTE — Progress Notes (Signed)
[name] [id number] 11-Oct-1945 73 y.o.   Virtual Visit via Telephone Note  I connected with pt by telephone and verified that I am speaking with the correct person using two identifiers.   I discussed the limitations, risks, security and privacy concerns of performing an evaluation and management service by telephone and the availability of in person appointments. I also discussed with the patient that there may be a patient responsible charge related to this service. The patient expressed understanding and agreed to proceed.  I discussed the assessment and treatment plan with the patient. The patient was provided an opportunity to ask questions and all were answered. The patient agreed with the plan and demonstrated an understanding of the instructions.   The patient was advised to call back or seek an in-person evaluation if the symptoms worsen or if the condition fails to improve as anticipated.  I provided 25 minutes of non-face-to-face time during this encounter. The call started at 315 and ended at 340. The patient was located at home and the provider was located office.   Subjective:   Patient ID:  Annette Johnston is a 73 y.o. (DOB 06-Apr-1946) female.  Chief Complaint:  Chief Complaint  Patient presents with  . Follow-up    Medication Management  . Anxiety    Medication Management    Anxiety  Symptoms include decreased concentration and nervous/anxious behavior. Patient reports no confusion, dizziness, nausea or suicidal ideas.      Annette Johnston presents to the office today for follow-up of recent worsening TD after stopping [name]  Last seen October 23, 2018.  No med changes were made as of that date.  She had just gotten back on the [name] 120 mg a day which was helpful for her tardive dyskinesia symptoms.  Been fine.  Just don't want to do anything and no get up and go but otherwose is OK.  Motivation low for about a year.  Not sad and depressed.  Not  cleaning much and yet needs to do it. Watches TV and lays around.  H cooks and she washes dishes some.  Good with tremors.  Sleep 12 hours daily.  Only takex diazepam at night.  Thinks she slept better with the Ativan.    Previously discussed case with Dr. Corwin Levins, neurology, while patient was in the room at Feb  visit.  We discussed her worsening symptoms while off of the [name].  He mentioned her D AT scan was suggestive of part possible parkinsonism and that is in part why he recommended discontinuing the [name].  Now upon seeing her, she walked into his office in FEB, she is clearly much worse.  He agrees with the assessment of tardive dyskinesia and that she needs to restart [name] or else Austedo.  She  indeed was much worse with her movement disorder of the [name].  She was very distressed by this and feels the symptoms are intolerable.  Therefore we  restarted the [name] at 80 mg a day for 3 days and then 120 mg a day which she took before.  There is a drug to drug interaction with the oxcarbazepine that increases the clearance of [name] requiring a higher than usual dose.  She tolerated this higher dosage before and it benefited from.  She admits down that she was mistaken when she stated that she did not benefit from the [name].  Better back on the [name].  Occ shakes more and anxious and agitated with renters.  But  clearly the Julio Alm is helping.  Recognizes easily agitated.  Doesn't remember if the Equetro worked better than oxcarbazepine for that or not.  In bed a lot. 12 hours.  Sleeps to escape renters.  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  Not doing much daytime except dishes and clothes. Arm is better.  Therpay for arm injury.  Some nervousness.  Still some degree of depression.  Energy and motivation are not that good.  She is not severely sad.  She can enjoy some things.   Past Psychiatric Medication Trials: Depakote jerks, lithium tremor, risperidone side effects,  Seroquel 300, remote history of ECT, Latuda, [name], paroxetine 30 for panic, Equetro good response, oxcarbazepine partial response, Saphris, lorazepam, sertraline, [name] partial response   Review of Systems:  Review of Systems  Gastrointestinal: Negative for nausea.  Musculoskeletal: Positive for arthralgias. Negative for back pain.  Neurological: Positive for tremors and weakness. Negative for dizziness.       Poor balance  Psychiatric/Behavioral: Positive for agitation, decreased concentration and dysphoric mood. Negative for behavioral problems, confusion, hallucinations, self-injury, sleep disturbance and suicidal ideas. The patient is nervous/anxious. The patient is not hyperactive.     Medications: I have reviewed the patient's current medications.  Current Outpatient Medications  Medication Sig Dispense Refill  . carbidopa-levodopa (SINEMET IR) 25-100 MG tablet Take 1-2 tablets by mouth 3 (three) times daily before meals. 180 tablet 12  . diazepam (VALIUM) 5 MG tablet TAKE 1 TABLET (5 MG TOTAL) BY MOUTH EVERY 8 (EIGHT) HOURS AS NEEDED FOR ANXIETY. 60 tablet 1  . diphenhydramine-acetaminophen (TYLENOL PM) 25-500 MG TABS tablet Take 1 tablet by mouth at bedtime as needed.    Marland Kitchen levothyroxine (SYNTHROID, LEVOTHROID) 137 MCG tablet Take 137 mcg by mouth daily before breakfast.    . Multiple Vitamin (MULTIVITAMIN) tablet Take 1 tablet by mouth daily.    . Oxcarbazepine (TRILEPTAL) 300 MG tablet TAKE 1 TABLET BY MOUTH TWICE A DAY 180 tablet 0  . PARoxetine (PAXIL) 30 MG tablet TAKE 1 TABLET BY MOUTH EVERY DAY (Patient taking differently: Take 15 mg by mouth daily. ) 90 tablet 0  . Valbenazine Tosylate ([name]) 40 MG CAPS Take 1 capsule by mouth daily. 30 capsule 5  . Valbenazine Tosylate ([name]) 80 MG CAPS Take 80 mg by mouth daily. 30 capsule 5  . VIBERZI 75 MG TABS TAKE 1 TABLET BY MOUTH 2 (TWO) TIMES DAILY WITH A MEAL. 60 tablet 3   No current facility-administered medications  for this visit.     Medication Side Effects: None  Allergies:  Allergies  Allergen Reactions  . Latex Rash    Bandaids    Past Medical History:  Diagnosis Date  . Anxiety   . Bipolar 1 disorder ([place])   . Depression   . Dyspnea on exertion july 2011   Normal stress test dec 2010. Reportedl normal CXR 2011. CT abdomen lung cut 2005  - normal. Normal PFT - march 2012  . Hypothyroidism   . Obesity (BMI 30-39.9)    BMI 36 Feb 2012  . Urticaria     Family History  Problem Relation Age of Onset  . Alzheimer's disease Mother   . Heart attack Father   . Coronary artery disease Brother   . Heart attack Brother   . Alzheimer's disease Paternal Grandmother   . Allergic rhinitis Neg Hx   . Angioedema Neg Hx   . Asthma Neg Hx   . Atopy Neg Hx   . Eczema Neg Hx   .  Immunodeficiency Neg Hx   . Urticaria Neg Hx     Social History   Socioeconomic History  . Marital status: Married    Spouse name: Annette Johnston  . Number of children: 1  . Years of education: Not on file  . Highest education level: High school graduate  Occupational History  . Occupation: retired  Scientific laboratory technician  . Financial resource strain: Not on file  . Food insecurity:    Worry: Not on file    Inability: Not on file  . Transportation needs:    Medical: Not on file    Non-medical: Not on file  Tobacco Use  . Smoking status: Never Smoker  . Smokeless tobacco: Never Used  Substance and Sexual Activity  . Alcohol use: Not Currently  . Drug use: No  . Sexual activity: Yes    Birth control/protection: Surgical  Lifestyle  . Physical activity:    Days per week: Not on file    Minutes per session: Not on file  . Stress: Not on file  Relationships  . Social connections:    Talks on phone: Not on file    Gets together: Not on file    Attends religious service: Not on file    Active member of club or organization: Not on file    Attends meetings of clubs or organizations: Not on file    Relationship status:  Not on file  . Intimate partner violence:    Fear of current or ex partner: Not on file    Emotionally abused: Not on file    Physically abused: Not on file    Forced sexual activity: Not on file  Other Topics Concern  . Not on file  Social History Narrative   Caffeine 1 cup per day    Lives at home with husband    Right Handed.     Past Medical History, Surgical history, Social history, and Family history were reviewed and updated as appropriate.   Please see review of systems for further details on the patient's review from today.   Objective:   Physical Exam:  There were no vitals taken for this visit.  Physical Exam Neurological:     Mental Status: She is alert and oriented to person, place, and time.     Cranial Nerves: No dysarthria.  Psychiatric:        Attention and Perception: Attention normal.        Mood and Affect: Mood is not anxious or depressed. Affect is not labile, angry, tearful or inappropriate.        Speech: Speech normal.        Behavior: Behavior is cooperative.        Thought Content: Thought content normal. Thought content is not paranoid or delusional. Thought content does not include homicidal or suicidal ideation. Thought content does not include homicidal or suicidal plan.        Cognition and Memory: Cognition and memory normal.        Judgment: Judgment normal.     Comments: Insight is fair.  She is not particularly sad but very un motivated     Lab Review:     Component Value Date/Time   NA 137 12/17/2017 1056   K 4.8 12/17/2017 1056   CL 98 12/17/2017 1056   CO2 23 12/17/2017 1056   GLUCOSE 89 12/17/2017 1056   GLUCOSE 87 06/06/2016 0959   BUN 15 12/17/2017 1056   CREATININE 0.64 12/17/2017 1056   CALCIUM  9.4 12/17/2017 1056   PROT 7.3 12/17/2017 1056   ALBUMIN 4.2 12/17/2017 1056   AST 30 12/17/2017 1056   ALT 26 12/17/2017 1056   ALKPHOS 77 12/17/2017 1056   BILITOT <0.2 12/17/2017 1056   GFRNONAA 90 12/17/2017 1056   GFRAA  104 12/17/2017 1056       Component Value Date/Time   WBC 6.7 12/17/2017 1056   WBC 6.1 06/06/2016 0959   RBC 4.27 12/17/2017 1056   RBC 4.64 06/06/2016 0959   HGB 13.1 12/17/2017 1056   HCT 38.2 12/17/2017 1056   PLT 329 12/17/2017 1056   MCV 90 12/17/2017 1056   MCH 30.7 12/17/2017 1056   MCH 30.0 06/06/2016 0959   MCHC 34.3 12/17/2017 1056   MCHC 33.7 06/06/2016 0959   RDW 14.0 12/17/2017 1056   LYMPHSABS 1.6 12/17/2017 1056   MONOABS 0.5 06/06/2016 0959   EOSABS 0.3 12/17/2017 1056   BASOSABS 0.0 12/17/2017 1056    No results found for: POCLITH, LITHIUM   No results found for: PHENYTOIN, PHENOBARB, VALPROATE, CBMZ   .res Assessment: Plan:    Bipolar affective disorder, currently depressed, mild (HCC)  Tardive dyskinesia  Generalized anxiety disorder  Mild cognitive impairment  Panic disorder with agoraphobia   Greater than 50% of face to face time with patient was spent on counseling and coordination of care. We discussed Ms. Redlich is a very complicated and difficult to treat bipolar patient with tardive dyskinesia.  It is complicated by the presence as well of mild cognitive impairment.  In addition complicated by multiple med failures and very limited options for mood stabilization.  The option that we are using still has a risk of drug drug interactions with the medication for tardive dyskinesia.  Her tardive dyskinesia symptoms are much improved back on the [name] 120 mg daily.  The high dose is required because of the presence of oxcarbazepine which induces metabolism of [name].  Discussed this drug interaction.  She does not want to change the medication because she is so relieved to have improvement.  She still has tremor and some tongue movement but it is much improved.  She is able to function adequately given her arm injury.  No med changes today.Marland Kitchen  Spending too much time in bed.  Needs to stay out of bed more.  Needs more exercise and rec walking.   Starting therapy for her arm.  We will consider transition to Austedo at some future point should we feel there is further improvement to be gained potentially in such a move.  However that would take time and she needs to improve as fast as possible.  She was not sedated from the [place] so we will start it back at the higher dosage.  Samples were given to speed the process.  The prescription was reordered.   We discussed the short-term risks associated with benzodiazepines including sedation and increased fall risk among others.  Discussed long-term side effect risk including dependence, potential withdrawal symptoms, and the potential eventual dose-related risk of dementia.  Lynder Parents, MD, DFAPA  Please see After Visit Summary for patient specific instructions.  No future appointments.  No orders of the defined types were placed in this encounter.     -------------------------------

## 2018-12-31 DIAGNOSIS — G20 Parkinson's disease: Secondary | ICD-10-CM | POA: Diagnosis not present

## 2018-12-31 DIAGNOSIS — S3982XA Other specified injuries of lower back, initial encounter: Secondary | ICD-10-CM | POA: Diagnosis not present

## 2018-12-31 DIAGNOSIS — W06XXXA Fall from bed, initial encounter: Secondary | ICD-10-CM | POA: Diagnosis not present

## 2018-12-31 DIAGNOSIS — M25511 Pain in right shoulder: Secondary | ICD-10-CM | POA: Diagnosis not present

## 2018-12-31 DIAGNOSIS — M545 Low back pain: Secondary | ICD-10-CM | POA: Diagnosis not present

## 2018-12-31 DIAGNOSIS — S4991XA Unspecified injury of right shoulder and upper arm, initial encounter: Secondary | ICD-10-CM | POA: Diagnosis not present

## 2018-12-31 DIAGNOSIS — S32010A Wedge compression fracture of first lumbar vertebra, initial encounter for closed fracture: Secondary | ICD-10-CM | POA: Diagnosis not present

## 2018-12-31 DIAGNOSIS — F319 Bipolar disorder, unspecified: Secondary | ICD-10-CM | POA: Diagnosis not present

## 2018-12-31 DIAGNOSIS — Z79899 Other long term (current) drug therapy: Secondary | ICD-10-CM | POA: Diagnosis not present

## 2018-12-31 DIAGNOSIS — M549 Dorsalgia, unspecified: Secondary | ICD-10-CM | POA: Diagnosis not present

## 2019-01-06 DIAGNOSIS — S32010A Wedge compression fracture of first lumbar vertebra, initial encounter for closed fracture: Secondary | ICD-10-CM | POA: Diagnosis not present

## 2019-01-06 DIAGNOSIS — Z6828 Body mass index (BMI) 28.0-28.9, adult: Secondary | ICD-10-CM | POA: Diagnosis not present

## 2019-02-03 DIAGNOSIS — S32010A Wedge compression fracture of first lumbar vertebra, initial encounter for closed fracture: Secondary | ICD-10-CM | POA: Diagnosis not present

## 2019-02-24 ENCOUNTER — Other Ambulatory Visit: Payer: Self-pay

## 2019-02-24 ENCOUNTER — Telehealth: Payer: Self-pay

## 2019-02-24 MED ORDER — VIBERZI 75 MG PO TABS: 1.0000 | ORAL_TABLET | Freq: Two times a day (BID) | ORAL | 3 refills | Status: DC

## 2019-02-24 NOTE — Telephone Encounter (Signed)
AB, pt is out of her medication. Pt needs a refill of her Viberzi 75 mg. It was sent to the refill box. Pt appolizgizes that she waiting to call in her medication at the last min

## 2019-02-24 NOTE — Telephone Encounter (Signed)
I have printed this. I have had difficulty sending electronically.

## 2019-02-24 NOTE — Addendum Note (Signed)
Addended by: Annitta Needs on: 02/24/2019 02:39 PM   Modules accepted: Orders

## 2019-02-24 NOTE — Telephone Encounter (Signed)
Noted. RX faxed to [place], [place]. Pt notified that RX was sent to the pharmacy.

## 2019-03-02 DIAGNOSIS — E871 Hypo-osmolality and hyponatremia: Secondary | ICD-10-CM | POA: Diagnosis not present

## 2019-03-02 DIAGNOSIS — R251 Tremor, unspecified: Secondary | ICD-10-CM | POA: Diagnosis not present

## 2019-03-02 DIAGNOSIS — Z79899 Other long term (current) drug therapy: Secondary | ICD-10-CM | POA: Diagnosis not present

## 2019-03-02 DIAGNOSIS — F419 Anxiety disorder, unspecified: Secondary | ICD-10-CM | POA: Diagnosis not present

## 2019-03-02 DIAGNOSIS — Z209 Contact with and (suspected) exposure to unspecified communicable disease: Secondary | ICD-10-CM | POA: Diagnosis not present

## 2019-03-02 DIAGNOSIS — F319 Bipolar disorder, unspecified: Secondary | ICD-10-CM | POA: Diagnosis not present

## 2019-03-02 DIAGNOSIS — G20 Parkinson's disease: Secondary | ICD-10-CM | POA: Diagnosis not present

## 2019-03-02 DIAGNOSIS — R457 State of emotional shock and stress, unspecified: Secondary | ICD-10-CM | POA: Diagnosis not present

## 2019-03-03 DIAGNOSIS — S32010A Wedge compression fracture of first lumbar vertebra, initial encounter for closed fracture: Secondary | ICD-10-CM | POA: Diagnosis not present

## 2019-03-04 ENCOUNTER — Other Ambulatory Visit: Payer: Self-pay | Admitting: Psychiatry

## 2019-03-04 ENCOUNTER — Telehealth: Payer: Self-pay | Admitting: Psychiatry

## 2019-03-04 NOTE — Telephone Encounter (Signed)
Tried to do a PA a few weeks ago and insurance said [employer] was trying to fill too soon that's why it was denied. Will contact them again to get details.

## 2019-03-04 NOTE — Telephone Encounter (Signed)
Annette Johnston from [place] left vm 07/21 @4 :23 need a Pa on [name] 40 and 80 mg

## 2019-03-05 NOTE — Telephone Encounter (Signed)
Spoke with [facility], they said she does have a paid claim already for [name] 40 mg and 80 mg on 02/25/2019, will need a Prior Authorization for the next fill though.

## 2019-03-06 NOTE — Telephone Encounter (Signed)
Tried to submit a new Prior Authorization for Annette Johnston but her last one does not expire till 03/08/2019, too soon to send per insurance.

## 2019-03-09 ENCOUNTER — Other Ambulatory Visit: Payer: Self-pay | Admitting: Psychiatry

## 2019-03-09 DIAGNOSIS — G2401 Drug induced subacute dyskinesia: Secondary | ICD-10-CM

## 2019-03-10 ENCOUNTER — Other Ambulatory Visit: Payer: Self-pay | Admitting: Psychiatry

## 2019-03-10 DIAGNOSIS — G2401 Drug induced subacute dyskinesia: Secondary | ICD-10-CM

## 2019-03-11 NOTE — Telephone Encounter (Signed)
Prior authorization called yesterday to [place] [employer] and is beening processed, they are waiting for her AIMS scales and office notes from her past visits to review the authorization for approval. Dr. Clovis Pu and office staff aware to send above information as requested.   OM#[phone] Fax [phone]

## 2019-03-16 ENCOUNTER — Other Ambulatory Visit: Payer: Self-pay | Admitting: Psychiatry

## 2019-03-16 DIAGNOSIS — G2401 Drug induced subacute dyskinesia: Secondary | ICD-10-CM

## 2019-03-16 NOTE — Telephone Encounter (Signed)
Prior authorization approved for [name] effective 03/13/2019-09/13/2019

## 2019-03-18 ENCOUNTER — Other Ambulatory Visit: Payer: Self-pay

## 2019-03-18 MED ORDER — DIAZEPAM 5 MG PO TABS: 5.0000 mg | ORAL_TABLET | Freq: Three times a day (TID) | ORAL | 1 refills | Status: DC | PRN

## 2019-04-14 DIAGNOSIS — S32010A Wedge compression fracture of first lumbar vertebra, initial encounter for closed fracture: Secondary | ICD-10-CM | POA: Diagnosis not present

## 2019-04-27 ENCOUNTER — Ambulatory Visit: Payer: Medicare Other | Admitting: Psychiatry

## 2019-04-27 ENCOUNTER — Ambulatory Visit (INDEPENDENT_AMBULATORY_CARE_PROVIDER_SITE_OTHER): Payer: Medicare Other | Admitting: Psychiatry

## 2019-04-27 ENCOUNTER — Other Ambulatory Visit: Payer: Self-pay | Admitting: Psychiatry

## 2019-04-27 ENCOUNTER — Encounter: Payer: Self-pay | Admitting: Psychiatry

## 2019-04-27 ENCOUNTER — Other Ambulatory Visit: Payer: Self-pay

## 2019-04-27 DIAGNOSIS — G3184 Mild cognitive impairment, so stated: Secondary | ICD-10-CM

## 2019-04-27 DIAGNOSIS — F319 Bipolar disorder, unspecified: Secondary | ICD-10-CM | POA: Diagnosis not present

## 2019-04-27 DIAGNOSIS — F5105 Insomnia due to other mental disorder: Secondary | ICD-10-CM | POA: Diagnosis not present

## 2019-04-27 DIAGNOSIS — F4001 Agoraphobia with panic disorder: Secondary | ICD-10-CM

## 2019-04-27 DIAGNOSIS — G2401 Drug induced subacute dyskinesia: Secondary | ICD-10-CM | POA: Diagnosis not present

## 2019-04-27 DIAGNOSIS — F411 Generalized anxiety disorder: Secondary | ICD-10-CM | POA: Diagnosis not present

## 2019-04-27 MED ORDER — LORAZEPAM 0.5 MG PO TABS: 0.5000 mg | ORAL_TABLET | Freq: Every evening | ORAL | 0 refills | Status: DC | PRN

## 2019-04-27 MED ORDER — OXCARBAZEPINE 300 MG PO TABS: ORAL_TABLET | ORAL | 0 refills | Status: DC

## 2019-04-27 NOTE — Progress Notes (Signed)
SHERIN LEDINGHAM [id number] [birth year] 73 y.o.    Subjective:   Patient ID:  Annette Johnston is a 73 y.o. (DOB 11-17-1945) female.  Chief Complaint:  Chief Complaint  Patient presents with  . Follow-up    Medication management  . Anxiety    Medication management  . Other    Bipolar 2  . Sleeping Problem    Anxiety Symptoms include decreased concentration and nervous/anxious behavior. Patient reports no confusion, dizziness, nausea or suicidal ideas.      Annette Johnston presents to the office today for follow-up of recent worsening TD after stopping [name]  When seen October 23, 2018.  No med changes were made as of that date.  She had just gotten back on the [name] 120 mg a day which was helpful for her tardive dyskinesia symptoms.  Last visit Dec 23, 2018.  No meds were changed.  She was spending too much time in bed and she was encouraged to get out of bed.  Compression fracture in back with resolution.  Episode of real shaking and thought she was having a seizure and went to there ER.  Has been twice and had Ativan and it calmed her down.  Was told it was anxiety and TD.  I'm addicted to the bed.  Chronic anxiety and some depression.  Stays in bed a lot.  Last 2 nights mind racing and couldn't go to sleep.  Claims doesn't sleep during the day.  No interest.  Little activity except goes out to eat nightly.  Worries over stupid stuff.  But also worries over $ bc renter problems.  Wants more med for depression.  Shaking worse on right side and some balance problems.  Been fine.  Just don't want to do anything and no get up and go but otherwose is OK.  Motivation low for about a year.  is sad and depressed.  Not crying as she did before Paroxetine. Not cleaning much and yet needs to do it. Watches TV and lays around.  H cooks and she washes dishes some.  Worse with tremors.  S  Thinks she slept better with the Ativan.    Pt reports that mood is Anxious, Depressed and Irritable  and describes anxiety as Moderate. Anxiety symptoms include: Excessive Worry,. Pt reports has interrupted sleep and has frequent nighttime awakenings. Pt reports that appetite is good. Pt reports that energy is anergia, lethargic and poor and anhedonia, loss of interest or pleasure in usual activities, poor motivation and withdrawn from usual activities. Concentration is difficulty with focus and attention. Suicidal thoughts:  denied by patient.  Note she's been taking 1 of 30 mg paroxetine daily not 15 mg as indicated previously in the chart.  Past Psychiatric Medication Trials: Depakote jerks, lithium tremor, risperidone side effects, Seroquel 300, remote history of ECT, Latuda, [name], paroxetine 30 for panic, Equetro good response, oxcarbazepine 600 partial response, Saphris, lorazepam, sertraline, [name] partial response   Review of Systems:  Review of Systems  Constitutional: Positive for fatigue.  Gastrointestinal: Negative for nausea.  Musculoskeletal: Positive for arthralgias. Negative for back pain.  Neurological: Positive for tremors and weakness. Negative for dizziness.       Poor balance  Psychiatric/Behavioral: Positive for agitation, decreased concentration and dysphoric mood. Negative for behavioral problems, confusion, hallucinations, self-injury, sleep disturbance and suicidal ideas. The patient is nervous/anxious. The patient is not hyperactive.     Medications: I have reviewed the patient's current medications.  Current Outpatient Medications  Medication  Sig Dispense Refill  . carbidopa-levodopa (SINEMET IR) 25-100 MG tablet Take 1-2 tablets by mouth 3 (three) times daily before meals. 180 tablet 12  . diphenhydramine-acetaminophen (TYLENOL PM) 25-500 MG TABS tablet Take 2 tablets by mouth at bedtime as needed.     . Eluxadoline (VIBERZI) 75 MG TABS Take 1 tablet by mouth 2 (two) times daily with a meal. 60 tablet 3  . [name] 40 MG CAPS Take 1 capsule by mouth daily.  30 capsule 5  . [name] 80 MG CAPS Take one capsule by mouth daily 30 capsule 5  . levothyroxine (SYNTHROID, LEVOTHROID) 137 MCG tablet Take 137 mcg by mouth daily before breakfast.    . Oxcarbazepine (TRILEPTAL) 300 MG tablet TAKE 1 TABLET BY MOUTH TWICE A DAY 180 tablet 0  . PARoxetine (PAXIL) 30 MG tablet TAKE 1 TABLET BY MOUTH EVERY DAY (Patient taking differently: Take 30 mg by mouth daily. ) 90 tablet 0   No current facility-administered medications for this visit.     Medication Side Effects: None  Allergies:  Allergies  Allergen Reactions  . Latex Rash    Bandaids    Past Medical History:  Diagnosis Date  . Anxiety   . Bipolar 1 disorder ([place])   . Depression   . Dyspnea on exertion july 2011   Normal stress test dec 2010. Reportedl normal CXR 2011. CT abdomen lung cut 2005  - normal. Normal PFT - march 2012  . Hypothyroidism   . Obesity (BMI 30-39.9)    BMI 36 Feb 2012  . Urticaria     Family History  Problem Relation Age of Onset  . Alzheimer's disease Mother   . Heart attack Father   . Coronary artery disease Brother   . Heart attack Brother   . Alzheimer's disease Paternal Grandmother   . Allergic rhinitis Neg Hx   . Angioedema Neg Hx   . Asthma Neg Hx   . Atopy Neg Hx   . Eczema Neg Hx   . Immunodeficiency Neg Hx   . Urticaria Neg Hx     Social History   Socioeconomic History  . Marital status: Married    Spouse name: [name]  . Number of children: 1  . Years of education: Not on file  . Highest education level: High school graduate  Occupational History  . Occupation: retired  Scientific laboratory technician  . Financial resource strain: Not on file  . Food insecurity    Worry: Not on file    Inability: Not on file  . Transportation needs    Medical: Not on file    Non-medical: Not on file  Tobacco Use  . Smoking status: Never Smoker  . Smokeless tobacco: Never Used  Substance and Sexual Activity  . Alcohol use: Not Currently  . Drug use: No  . Sexual  activity: Yes    Birth control/protection: Surgical  Lifestyle  . Physical activity    Days per week: Not on file    Minutes per session: Not on file  . Stress: Not on file  Relationships  . Social Herbalist on phone: Not on file    Gets together: Not on file    Attends religious service: Not on file    Active member of club or organization: Not on file    Attends meetings of clubs or organizations: Not on file    Relationship status: Not on file  . Intimate partner violence    Fear of current or  ex partner: Not on file    Emotionally abused: Not on file    Physically abused: Not on file    Forced sexual activity: Not on file  Other Topics Concern  . Not on file  Social History Narrative   Caffeine 1 cup per day    Lives at home with husband    Right Handed.     Past Medical History, Surgical history, Social history, and Family history were reviewed and updated as appropriate.   Please see review of systems for further details on the patient's review from today.   Objective:   Physical Exam:  There were no vitals taken for this visit.  Physical Exam Constitutional:      General: She is not in acute distress.    Appearance: She is well-developed.  Musculoskeletal:        General: No deformity.  Neurological:     Mental Status: She is alert and oriented to person, place, and time.     Cranial Nerves: No dysarthria.     Motor: Tremor present.     Coordination: Coordination normal.     Comments: Tremor right greater than left.    Psychiatric:        Attention and Perception: Attention and perception normal. She does not perceive auditory or visual hallucinations.        Mood and Affect: Mood is anxious and depressed. Mood is not elated. Affect is blunt. Affect is not labile, angry, tearful or inappropriate.        Speech: Speech normal.        Behavior: Behavior is hyperactive. Behavior is cooperative.        Thought Content: Thought content normal.  Thought content is not paranoid or delusional. Thought content does not include homicidal or suicidal ideation. Thought content does not include homicidal or suicidal plan.        Cognition and Memory: Cognition and memory normal.        Judgment: Judgment normal.     Comments: Insight is fair. More depressed than last time. Fidgety.     Lab Review:     Component Value Date/Time   NA 137 12/17/2017 1056   K 4.8 12/17/2017 1056   CL 98 12/17/2017 1056   CO2 23 12/17/2017 1056   GLUCOSE 89 12/17/2017 1056   GLUCOSE 87 06/06/2016 0959   BUN 15 12/17/2017 1056   CREATININE 0.64 12/17/2017 1056   CALCIUM 9.4 12/17/2017 1056   PROT 7.3 12/17/2017 1056   ALBUMIN 4.2 12/17/2017 1056   AST 30 12/17/2017 1056   ALT 26 12/17/2017 1056   ALKPHOS 77 12/17/2017 1056   BILITOT <0.2 12/17/2017 1056   GFRNONAA 90 12/17/2017 1056   GFRAA 104 12/17/2017 1056       Component Value Date/Time   WBC 6.7 12/17/2017 1056   WBC 6.1 06/06/2016 0959   RBC 4.27 12/17/2017 1056   RBC 4.64 06/06/2016 0959   HGB 13.1 12/17/2017 1056   HCT 38.2 12/17/2017 1056   PLT 329 12/17/2017 1056   MCV 90 12/17/2017 1056   MCH 30.7 12/17/2017 1056   MCH 30.0 06/06/2016 0959   MCHC 34.3 12/17/2017 1056   MCHC 33.7 06/06/2016 0959   RDW 14.0 12/17/2017 1056   LYMPHSABS 1.6 12/17/2017 1056   MONOABS 0.5 06/06/2016 0959   EOSABS 0.3 12/17/2017 1056   BASOSABS 0.0 12/17/2017 1056    No results found for: POCLITH, LITHIUM   No results found for: PHENYTOIN, PHENOBARB, VALPROATE,  CBMZ   .res Assessment: Plan:    Bipolar I disorder ([place])  Generalized anxiety disorder  Panic disorder with agoraphobia  Mild cognitive impairment  Tardive dyskinesia  Insomnia due to mental condition   Greater than 50% of face to face time with patient was spent on counseling and coordination of care. We discussed Ms. Inocencio is a very complicated and difficult to treat bipolar patient with tardive dyskinesia.  It is  complicated by the presence as well of mild cognitive impairment.  In addition complicated by multiple med failures and very limited options for mood stabilization.  The option that we are using still has a risk of drug drug interactions with the medication for tardive dyskinesia.  Her tardive dyskinesia symptoms are  improved back on the [name] 120 mg daily but she is more fidgety and has more tremor than last visit  Consider switch to Austedo..  The high dose is required because of the presence of oxcarbazepine which induces metabolism of [name].  Discussed this drug interaction.  She does not want to change the medication because she is so relieved to have improvement.  She still has tremor and some tongue movement but it is much improved.  She is able to function adequately given her arm injury.  For racing thoughts, insomnia and anxiety increase oxcarbazepine to 1 in the morning and 2 at night of the 300 mg tablets.  Spending too much time in bed.  Needs to stay out of bed more.  Inactivity is causing the weakness.  Needs more exercise and rec walking.  Starting therapy for her arm.  CO more anxious and depression and consider lamotrigine bc hesitant to increase paroxetine DT risk mood cycling.   We discussed the short-term risks associated with benzodiazepines including sedation and increased fall risk among others.  Discussed long-term side effect risk including dependence, potential withdrawal symptoms, and the potential eventual dose-related risk of dementia. OK switch back to Ativan 0.5 to 1 mg HS per her request.    This appt was 30 mins.  Lynder Parents, MD, DFAPA  Please see After Visit Summary for patient specific instructions.  No future appointments.  No orders of the defined types were placed in this encounter.     -------------------------------

## 2019-04-27 NOTE — Patient Instructions (Addendum)
For racing thoughts, insomnia and anxiety increase oxcarbazepine to 1 in the morning and 2 at night of the 300 mg tablets.  Stop diazepam and start Ativan (lorazepam) 1-2 tablets as needed for sleep

## 2019-04-27 NOTE — Telephone Encounter (Signed)
Has appt today at 1

## 2019-05-04 ENCOUNTER — Telehealth: Payer: Self-pay | Admitting: Psychiatry

## 2019-05-04 NOTE — Telephone Encounter (Signed)
Change Trileptal to 300 mg tablets 1 in the morning and 1-1/2 in the evening.

## 2019-05-04 NOTE — Telephone Encounter (Signed)
Pt called to advise since increase in trileptal 300 mg to 2 at night she has started gritting her teeth, left jaw locked up and speech slur

## 2019-05-19 ENCOUNTER — Other Ambulatory Visit: Payer: Self-pay | Admitting: Psychiatry

## 2019-05-19 ENCOUNTER — Telehealth: Payer: Self-pay | Admitting: Psychiatry

## 2019-05-19 MED ORDER — BENZTROPINE MESYLATE 0.5 MG PO TABS: 1.0000 mg | ORAL_TABLET | Freq: Two times a day (BID) | ORAL | 0 refills | Status: DC

## 2019-05-19 NOTE — Telephone Encounter (Signed)
Patient stated her face is hurting and jaws are locking since she stopped taking the half of pill Oxcarbazepine, patient is in a lot of pain wants to know what Dr. Clovis Pu can suggest to stop her jaws from locking up she can barely chew

## 2019-05-19 NOTE — Telephone Encounter (Signed)
Put her on cxl list for 30 min

## 2019-05-19 NOTE — Telephone Encounter (Signed)
Put her on cancellation list for 30 min.  Will need to make a big med change.  In the short term she can try benztropine 0.5 mg BID .  Rx sent in.

## 2019-05-20 ENCOUNTER — Other Ambulatory Visit: Payer: Self-pay | Admitting: Psychiatry

## 2019-05-20 DIAGNOSIS — F319 Bipolar disorder, unspecified: Secondary | ICD-10-CM

## 2019-05-20 DIAGNOSIS — F4001 Agoraphobia with panic disorder: Secondary | ICD-10-CM

## 2019-05-20 DIAGNOSIS — F411 Generalized anxiety disorder: Secondary | ICD-10-CM

## 2019-05-21 IMAGING — NM NM DATSCAN
4 series · 33 of 40 positions shown · non-contrast
Comparison: Brain MRI 04/17/2018

CLINICAL DATA: RIGHT-sided tremors, poor gait. Frequent falls.
71-year-old male

EXAM:
NUCLEAR MEDICINE BRAIN IMAGING WITH SPECT  (DaTscan )
TECHNIQUE: SPECT images of the brain were obtained after intravenous injection
of radiopharmaceutical. 4 hour post injection imaging. Appropriate
positioning. 0.8 ml lugols solution administered in a.m
RADIOPHARMACEUTICALS:  4.3 millicuries I 123 Ioflupane

[Series 1: spect - (id) _(id)_tra · 4.1mm · 4.14mm/px · 5 of 128 frames shown]
[frame 11/128]
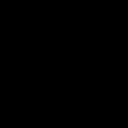
[frame 32/128]
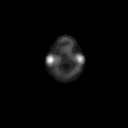
[frame 75/128]
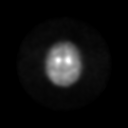
[frame 96/128]
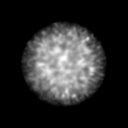
[frame 118/128]
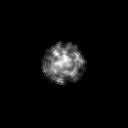

[Series 1: spect - (id) _(id)_cor · 4.1mm · 4.14mm/px · 5 of 128 frames shown]
[frame 11/128]
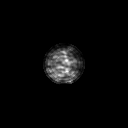
[frame 32/128]
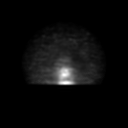
[frame 75/128]
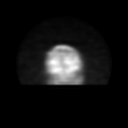
[frame 96/128]
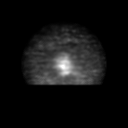
[frame 118/128]
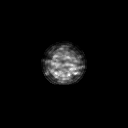

[Series 1: brain spect · 4.14mm/px · 5 of 120 frames shown]
[frame 11/120  full-range]
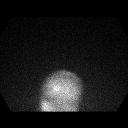
[frame 31/120  full-range]
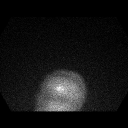
[frame 71/120  full-range]
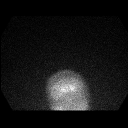
[frame 91/120  full-range]
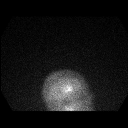
[frame 111/120  full-range]
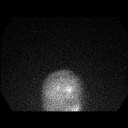

[Series 1016: mpr 4.1mm range · 1.17mm/px · 18 of 40 slices shown]
[im 1/40]
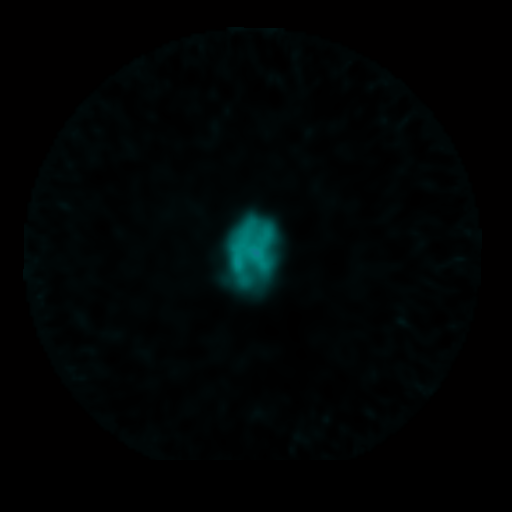
[im 4/40]
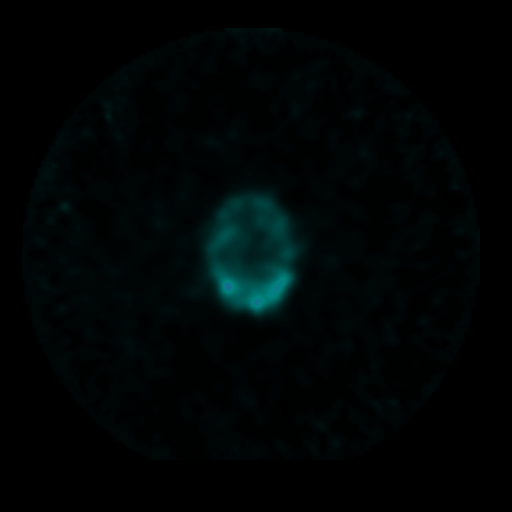
[im 6/40]
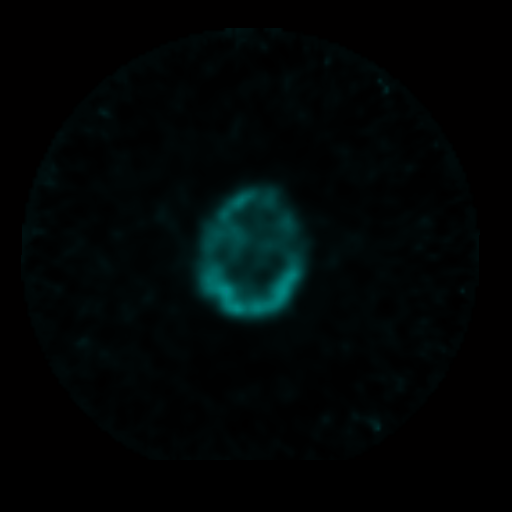
[im 8/40]
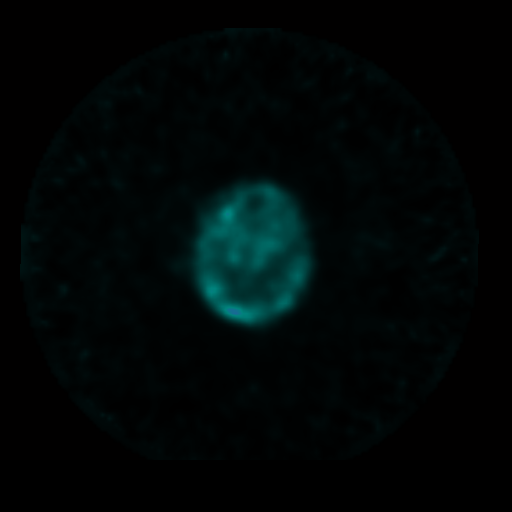
[im 10/40]
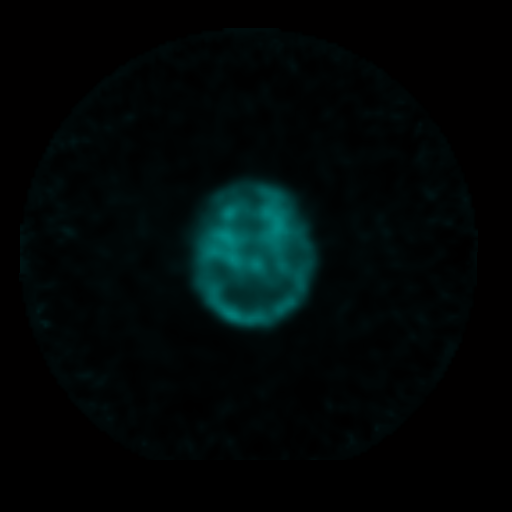
[im 12/40]
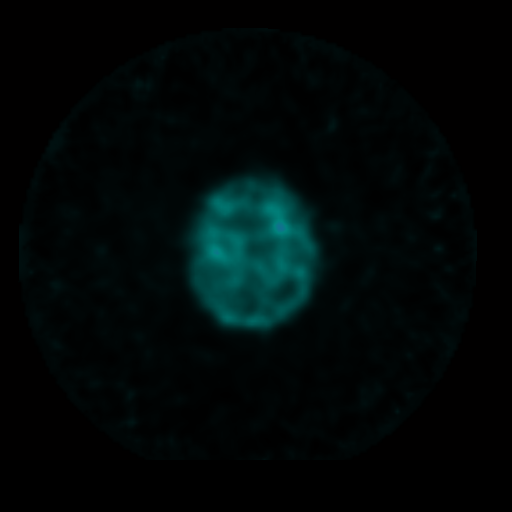
[im 15/40]
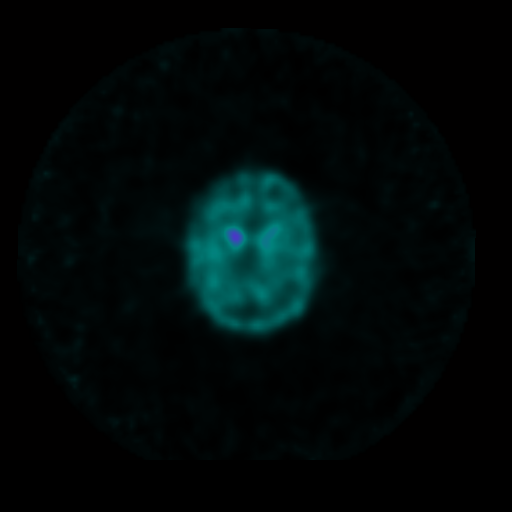
[im 17/40]
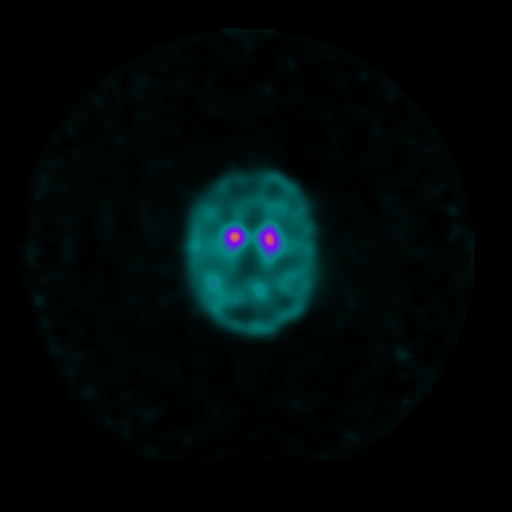
[im 19/40]
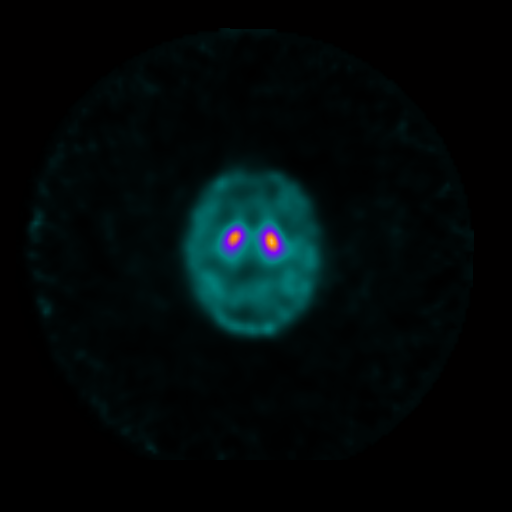
[im 21/40]
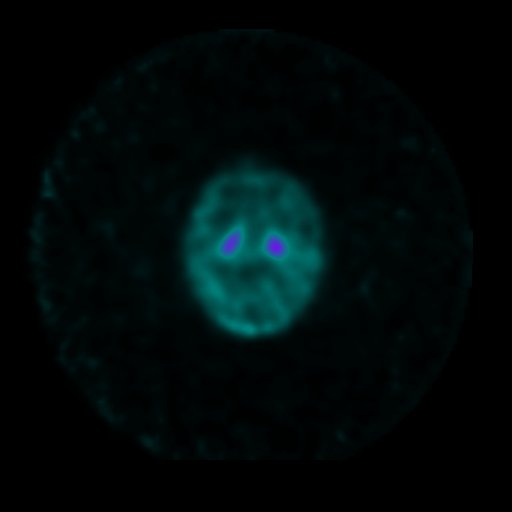
[im 23/40]
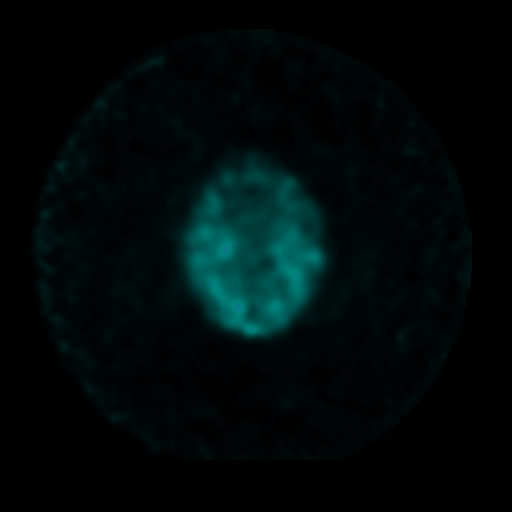
[im 27/40]
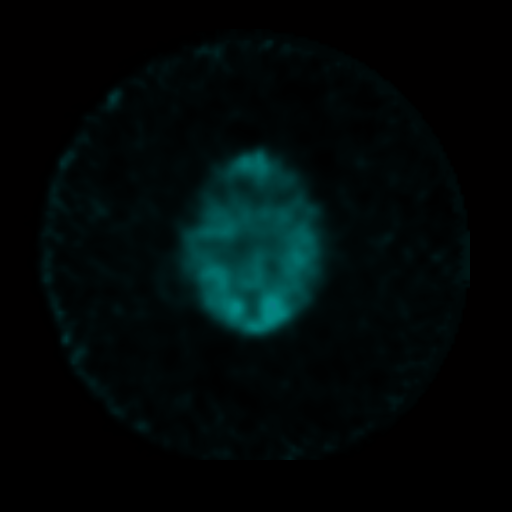
[im 28/40]
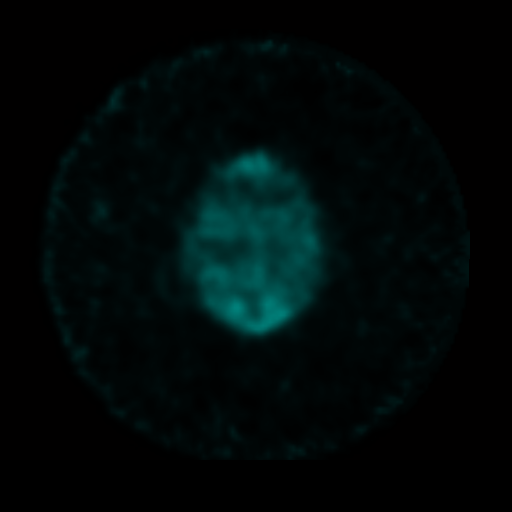
[im 30/40]
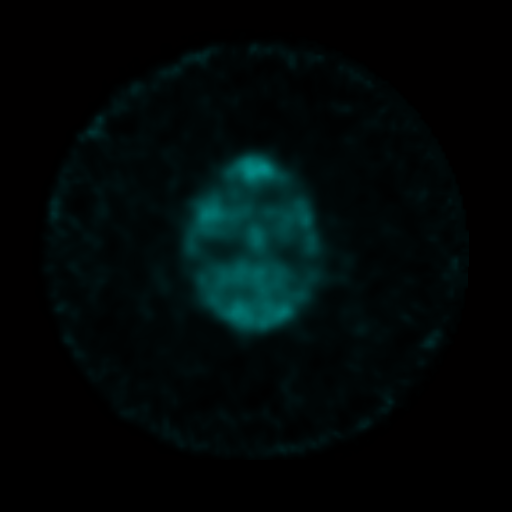
[im 32/40]
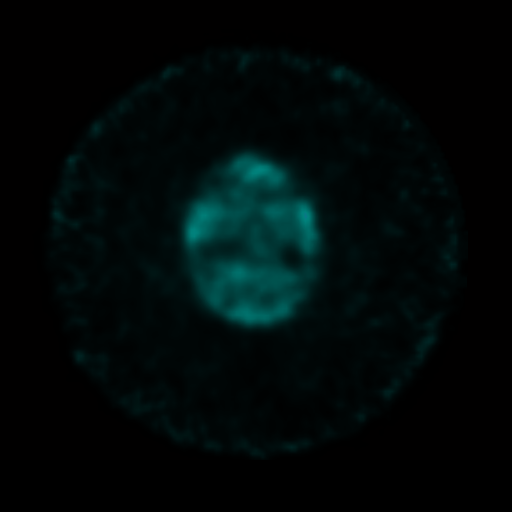
[im 34/40]
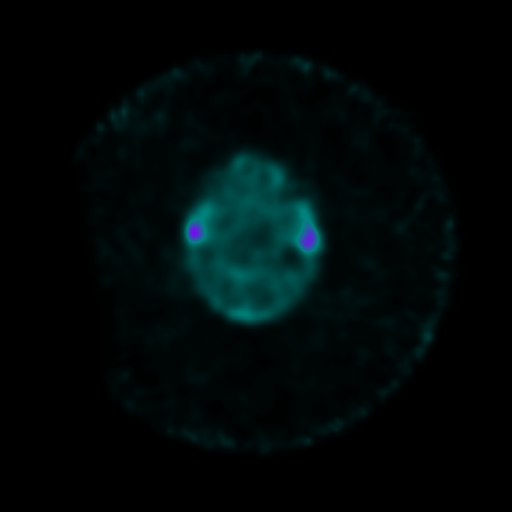
[im 38/40]
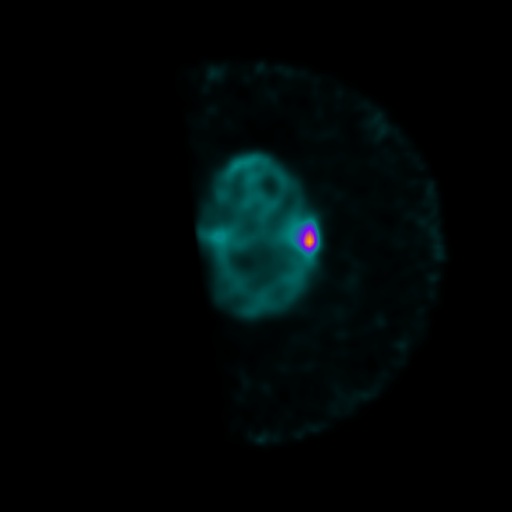
[im 40/40]
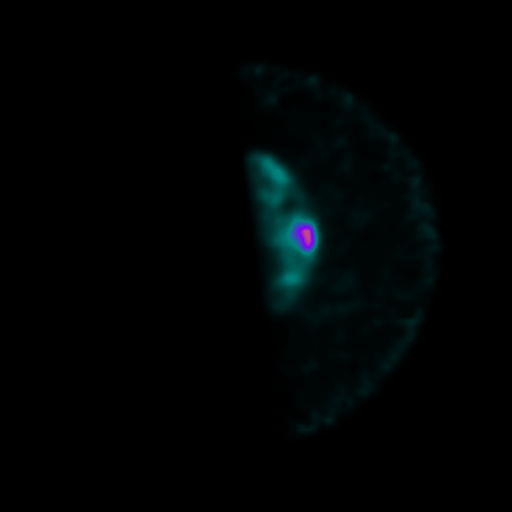

[33 of 40 positions shown; findings below may reference images not displayed]

FINDINGS: There is normal uptake within the heads of the caudate nuclei. There
is a suggestion of decreased uptake in the posterior striata albeit
subtle. This decreased posterior uptake is more prominent on the
RIGHT than the LEFT. Favor pattern to represent loss of activity
within the posterior putamen.
IMPRESSION: Subtle loss of activity in the posterior putamen greater on the
RIGHT is favored Parkinson's syndrome pathology pattern.

## 2019-05-25 ENCOUNTER — Telehealth: Payer: Self-pay | Admitting: Psychiatry

## 2019-05-25 ENCOUNTER — Other Ambulatory Visit: Payer: Self-pay | Admitting: Psychiatry

## 2019-05-25 NOTE — Telephone Encounter (Signed)
Spoke with patient. See phone message. 

## 2019-05-25 NOTE — Telephone Encounter (Signed)
Pt still not doing well. Pt would like to talk to someone. Pt has appt 10/26, and was informed that she is on the cancel list, but there is no openings for today or tomorrow.

## 2019-05-25 NOTE — Telephone Encounter (Signed)
RTC  Not good at all.  Shaking again like I did before. Tongue coming out and teeth chatter.    Jaw locked up last week. Hurts at times.  Benztropine did not help.    Needs switch to Austedo from [employer].    Stop benztropine bc it didn't help.  Start lorazepam 0.5 mg qid.  Until can get Austedo.  Lynder Parents, MD, DFAPA

## 2019-05-27 ENCOUNTER — Other Ambulatory Visit: Payer: Self-pay | Admitting: Psychiatry

## 2019-05-27 DIAGNOSIS — Z6829 Body mass index (BMI) 29.0-29.9, adult: Secondary | ICD-10-CM | POA: Diagnosis not present

## 2019-05-27 DIAGNOSIS — H9209 Otalgia, unspecified ear: Secondary | ICD-10-CM | POA: Diagnosis not present

## 2019-05-27 DIAGNOSIS — H9203 Otalgia, bilateral: Secondary | ICD-10-CM | POA: Diagnosis not present

## 2019-05-27 DIAGNOSIS — S0300XA Dislocation of jaw, unspecified side, initial encounter: Secondary | ICD-10-CM | POA: Diagnosis not present

## 2019-05-27 DIAGNOSIS — G249 Dystonia, unspecified: Secondary | ICD-10-CM | POA: Diagnosis not present

## 2019-05-27 DIAGNOSIS — R6884 Jaw pain: Secondary | ICD-10-CM | POA: Diagnosis not present

## 2019-06-03 ENCOUNTER — Telehealth: Payer: Self-pay | Admitting: Psychiatry

## 2019-06-03 ENCOUNTER — Other Ambulatory Visit: Payer: Self-pay | Admitting: Psychiatry

## 2019-06-03 NOTE — Telephone Encounter (Signed)
Correction on med,  She needs to verify the dose on the [name] and the Austedo for week two.

## 2019-06-03 NOTE — Telephone Encounter (Signed)
Needs to discuss dosage on Invega. Has some confusion on how to take it.

## 2019-06-04 ENCOUNTER — Other Ambulatory Visit: Payer: Self-pay | Admitting: Psychiatry

## 2019-06-04 DIAGNOSIS — F411 Generalized anxiety disorder: Secondary | ICD-10-CM

## 2019-06-04 DIAGNOSIS — F319 Bipolar disorder, unspecified: Secondary | ICD-10-CM

## 2019-06-04 DIAGNOSIS — F4001 Agoraphobia with panic disorder: Secondary | ICD-10-CM

## 2019-06-04 MED ORDER — AUSTEDO 12 MG PO TABS: 12.0000 mg | ORAL_TABLET | Freq: Two times a day (BID) | ORAL | 1 refills | Status: DC

## 2019-06-04 NOTE — Telephone Encounter (Signed)
Have sent in Austedo prescription 12 mg twice daily

## 2019-06-04 NOTE — Telephone Encounter (Signed)
Spoke with Annette Johnston and went over week 2 instructions. She went about 3 days with no shaking/trembling at all. It has returned but not as bad. She would like you to go ahead and send Rx in for the 12 mg of Austedo. She thinks it is working well. She had to reschedule her appt. With you because she will be out of town next week. She will let us know how things go on the higher dose.

## 2019-06-04 NOTE — Progress Notes (Signed)
Switching from [name] to Austedo and increasing Austedo 12 mg twice daily

## 2019-06-05 ENCOUNTER — Other Ambulatory Visit: Payer: Self-pay | Admitting: Psychiatry

## 2019-06-05 ENCOUNTER — Telehealth: Payer: Self-pay

## 2019-06-05 DIAGNOSIS — F411 Generalized anxiety disorder: Secondary | ICD-10-CM

## 2019-06-05 DIAGNOSIS — F4001 Agoraphobia with panic disorder: Secondary | ICD-10-CM

## 2019-06-05 DIAGNOSIS — F319 Bipolar disorder, unspecified: Secondary | ICD-10-CM

## 2019-06-05 MED ORDER — OXCARBAZEPINE 300 MG PO TABS: ORAL_TABLET | ORAL | 0 refills | Status: DC

## 2019-06-05 NOTE — Telephone Encounter (Signed)
Annette Johnston called stating she needs a refill on her Trileptal since her dose has increased. Please [facility] to [facility] [place]. She needs before she goes out of town tomorrow. Thanks

## 2019-06-05 NOTE — Telephone Encounter (Signed)
done

## 2019-06-08 ENCOUNTER — Ambulatory Visit: Payer: Medicare Other | Admitting: Psychiatry

## 2019-06-08 ENCOUNTER — Other Ambulatory Visit: Payer: Self-pay

## 2019-06-08 ENCOUNTER — Telehealth: Payer: Self-pay | Admitting: Psychiatry

## 2019-06-08 ENCOUNTER — Encounter

## 2019-06-08 MED ORDER — AUSTEDO 12 MG PO TABS: 12.0000 mg | ORAL_TABLET | Freq: Two times a day (BID) | ORAL | 2 refills | Status: DC

## 2019-06-08 NOTE — Telephone Encounter (Signed)
Pt left v-mail. Will be out of the med she has to get from [facility] on Tuesday and needs to know what to do from [place].  [phone]

## 2019-06-08 NOTE — Telephone Encounter (Signed)
[place] with pt. And she stated they will mail her medication to her. If it does not get to her before she runs out she will come get samples.

## 2019-06-08 NOTE — Telephone Encounter (Signed)
I left her a VM to return my call.

## 2019-06-11 ENCOUNTER — Other Ambulatory Visit: Payer: Self-pay | Admitting: Psychiatry

## 2019-06-11 DIAGNOSIS — F411 Generalized anxiety disorder: Secondary | ICD-10-CM

## 2019-06-11 DIAGNOSIS — F319 Bipolar disorder, unspecified: Secondary | ICD-10-CM

## 2019-06-11 DIAGNOSIS — F4001 Agoraphobia with panic disorder: Secondary | ICD-10-CM

## 2019-06-12 DIAGNOSIS — E782 Mixed hyperlipidemia: Secondary | ICD-10-CM | POA: Diagnosis not present

## 2019-06-12 DIAGNOSIS — I10 Essential (primary) hypertension: Secondary | ICD-10-CM | POA: Diagnosis not present

## 2019-06-14 ENCOUNTER — Other Ambulatory Visit: Payer: Self-pay | Admitting: Psychiatry

## 2019-06-15 ENCOUNTER — Telehealth: Payer: Self-pay

## 2019-06-15 NOTE — Telephone Encounter (Signed)
Prior authorization submitted and approved for Austedo effective 06/13/2019-12/12/2019 with [facility]

## 2019-06-16 ENCOUNTER — Encounter: Payer: Self-pay | Admitting: Psychiatry

## 2019-06-16 ENCOUNTER — Ambulatory Visit (INDEPENDENT_AMBULATORY_CARE_PROVIDER_SITE_OTHER): Payer: Medicare Other | Admitting: Psychiatry

## 2019-06-16 ENCOUNTER — Other Ambulatory Visit: Payer: Self-pay

## 2019-06-16 DIAGNOSIS — G3184 Mild cognitive impairment, so stated: Secondary | ICD-10-CM | POA: Diagnosis not present

## 2019-06-16 DIAGNOSIS — G2401 Drug induced subacute dyskinesia: Secondary | ICD-10-CM | POA: Diagnosis not present

## 2019-06-16 DIAGNOSIS — R110 Nausea: Secondary | ICD-10-CM

## 2019-06-16 DIAGNOSIS — F319 Bipolar disorder, unspecified: Secondary | ICD-10-CM

## 2019-06-16 DIAGNOSIS — F4001 Agoraphobia with panic disorder: Secondary | ICD-10-CM

## 2019-06-16 DIAGNOSIS — F5105 Insomnia due to other mental disorder: Secondary | ICD-10-CM

## 2019-06-16 DIAGNOSIS — F411 Generalized anxiety disorder: Secondary | ICD-10-CM | POA: Diagnosis not present

## 2019-06-16 DIAGNOSIS — F458 Other somatoform disorders: Secondary | ICD-10-CM

## 2019-06-16 MED ORDER — PROMETHAZINE HCL 25 MG PO TABS: 25.0000 mg | ORAL_TABLET | Freq: Four times a day (QID) | ORAL | 0 refills | Status: DC | PRN

## 2019-06-16 MED ORDER — OXCARBAZEPINE 300 MG PO TABS: ORAL_TABLET | ORAL | 0 refills | Status: DC

## 2019-06-16 NOTE — Progress Notes (Signed)
LAQUITTA Johnston [id number] [birth year] 73 y.o.    Subjective:   Patient ID:  Annette Johnston is a 73 y.o. (DOB Dec 25, 1945) female.  Chief Complaint:  Chief Complaint  Patient presents with  . Follow-up    Medication Management  . Anxiety    Medication Management  . Other    Bipolar1    Anxiety Symptoms include decreased concentration, dizziness, nausea and nervous/anxious behavior. Patient reports no confusion or suicidal ideas.      Annette Johnston presents to the office today for follow-up of recent worsening TD after stopping [name]  When seen October 23, 2018.  No med changes were made as of that date.  She had just gotten back on the [name] 120 mg a day which was helpful for her tardive dyskinesia symptoms.  At visit Dec 23, 2018.  No meds were changed.  She was spending too much time in bed and she was encouraged to get out of bed.  Last seen April 27, 2019.  Because of racing thoughts oxcarbazepine was increased.  It helped. She was having significant residual tardive dyskinesia symptoms on [name] at a high dosage but she did not want to change medicines at that time.  However since that time she called and wanted to make the change to Austedo.  There have been multiple phone calls since September 14.  She called October 6 complaining that her jaw was locking up.  A trial of benztropine 0.5 mg twice daily was ineffective.  She called October 12 stating the tardive dyskinesia symptoms were worse.  There was some concern that perhaps Trileptal was inducing metabolism of [name].  We started the switch from [name] to [place].  She was given lorazepam 0.5 mg 4 times daily for anxiety.  As of October 21 she had increased Austedo to 12 mg twice daily and felt it was helpful.  CO nausea starting a week ago.  In bed a lot since then.  Has also been dizzy.  Off [name] for a week.  On Austed for a week.    PCP rx meloxicam on 10/14 and the jaws stopped locking up.   Also Rx Zofran on 11/2.  It helps some but not completely.  Episode of real shaking and thought she was having a seizure and went to there ER.  Has been twice and had Ativan and it calmed her down.  Was told it was anxiety and TD.  I'm addicted to the bed.  Chronic anxiety and some depression.  Stays in bed a lot.  Less mind racing and sleep some better.  Claims doesn't sleep during the day.  No interest.  Little activity except goes out to eat nightly.  Worries over stupid stuff.  But also worries over $ bc renter problems.  Wants more med for depression.  Shaking worse on right side and some balance problems.  Been fine.  Just don't want to do anything and no get up and go but otherwose is OK.  Motivation low for about a year.  is sad and depressed.  Not crying as she did before Paroxetine. Not cleaning much and yet needs to do it. Watches TV and lays around.  H cooks and she washes dishes some.  Worse with tremors.  S  Thinks she slept better with the Ativan.    Pt reports that mood is Anxious, Depressed and Irritable and describes anxiety as Moderate. Anxiety symptoms include: Excessive Worry,. Pt reports has interrupted sleep and has frequent nighttime  awakenings. Pt reports that appetite is good. Pt reports that energy is anergia, lethargic and poor and anhedonia, loss of interest or pleasure in usual activities, poor motivation and withdrawn from usual activities. Concentration is difficulty with focus and attention. Suicidal thoughts:  denied by patient.  Note she's been taking 1 of 30 mg paroxetine daily not 15 mg as indicated previously in the chart.  Past Psychiatric Medication Trials: Depakote jerks, lithium tremor, risperidone side effects, Seroquel 300, remote history of ECT, Latuda, [name], paroxetine 30 for panic, Equetro good response, oxcarbazepine 600 partial response, Saphris, lorazepam, sertraline, [name] partial response   Review of Systems:  Review of Systems   Constitutional: Positive for fatigue.  Gastrointestinal: Positive for nausea.  Musculoskeletal: Positive for arthralgias. Negative for back pain.  Neurological: Positive for dizziness, tremors and weakness.       Poor balance  Psychiatric/Behavioral: Positive for agitation, decreased concentration and dysphoric mood. Negative for behavioral problems, confusion, hallucinations, self-injury, sleep disturbance and suicidal ideas. The patient is nervous/anxious. The patient is not hyperactive.     Medications: I have reviewed the patient's current medications.  Current Outpatient Medications  Medication Sig Dispense Refill  . carbidopa-levodopa (SINEMET IR) 25-100 MG tablet Take 1-2 tablets by mouth 3 (three) times daily before meals. 180 tablet 12  . Deutetrabenazine (AUSTEDO) 12 MG TABS Take 12 mg by mouth 2 (two) times daily. 60 tablet 2  . diphenhydramine-acetaminophen (TYLENOL PM) 25-500 MG TABS tablet Take 2 tablets by mouth at bedtime as needed.     . Eluxadoline (VIBERZI) 75 MG TABS Take 1 tablet by mouth 2 (two) times daily with a meal. 60 tablet 3  . levothyroxine (SYNTHROID, LEVOTHROID) 137 MCG tablet Take 137 mcg by mouth daily before breakfast.    . LORazepam (ATIVAN) 0.5 MG tablet Take 1-2 tablets (0.5-1 mg total) by mouth at bedtime as needed for sleep. 180 tablet 0  . meloxicam (MOBIC) 15 MG tablet Take 15 mg by mouth daily.    . ondansetron (ZOFRAN-ODT) 4 MG disintegrating tablet Take 4 mg by mouth 2 (two) times daily.     . Oxcarbazepine (TRILEPTAL) 300 MG tablet TAKE 1 TABLET BY MOUTH EVERY MORNING AND 1 AND 1/2 TABLETS AT NIGHT 270 tablet 0  . PARoxetine (PAXIL) 30 MG tablet Take 1 tablet (30 mg total) by mouth daily. 90 tablet 1  . promethazine (PHENERGAN) 25 MG tablet Take 1 tablet (25 mg total) by mouth every 6 (six) hours as needed for nausea or vomiting. 45 tablet 0   No current facility-administered medications for this visit.     Medication Side Effects:  None  Allergies:  Allergies  Allergen Reactions  . Latex Rash    Bandaids    Past Medical History:  Diagnosis Date  . Anxiety   . Bipolar 1 disorder ([place])   . Depression   . Dyspnea on exertion july 2011   Normal stress test dec 2010. Reportedl normal CXR 2011. CT abdomen lung cut 2005  - normal. Normal PFT - march 2012  . Hypothyroidism   . Obesity (BMI 30-39.9)    BMI 36 Feb 2012  . Urticaria     Family History  Problem Relation Age of Onset  . Alzheimer's disease Mother   . Heart attack Father   . Coronary artery disease Brother   . Heart attack Brother   . Alzheimer's disease Paternal Grandmother   . Allergic rhinitis Neg Hx   . Angioedema Neg Hx   . Asthma Neg  Hx   . Atopy Neg Hx   . Eczema Neg Hx   . Immunodeficiency Neg Hx   . Urticaria Neg Hx     Social History   Socioeconomic History  . Marital status: Married    Spouse name: [name]  . Number of children: 1  . Years of education: Not on file  . Highest education level: High school graduate  Occupational History  . Occupation: retired  Scientific laboratory technician  . Financial resource strain: Not on file  . Food insecurity    Worry: Not on file    Inability: Not on file  . Transportation needs    Medical: Not on file    Non-medical: Not on file  Tobacco Use  . Smoking status: Never Smoker  . Smokeless tobacco: Never Used  Substance and Sexual Activity  . Alcohol use: Not Currently  . Drug use: No  . Sexual activity: Yes    Birth control/protection: Surgical  Lifestyle  . Physical activity    Days per week: Not on file    Minutes per session: Not on file  . Stress: Not on file  Relationships  . Social Herbalist on phone: Not on file    Gets together: Not on file    Attends religious service: Not on file    Active member of club or organization: Not on file    Attends meetings of clubs or organizations: Not on file    Relationship status: Not on file  . Intimate partner violence    Fear of  current or ex partner: Not on file    Emotionally abused: Not on file    Physically abused: Not on file    Forced sexual activity: Not on file  Other Topics Concern  . Not on file  Social History Narrative   Caffeine 1 cup per day    Lives at home with husband    Right Handed.     Past Medical History, Surgical history, Social history, and Family history were reviewed and updated as appropriate.   Please see review of systems for further details on the patient's review from today.   Objective:   Physical Exam:  There were no vitals taken for this visit.  Physical Exam Constitutional:      General: She is not in acute distress.    Appearance: She is well-developed.  Musculoskeletal:        General: No deformity.  Neurological:     Mental Status: She is alert and oriented to person, place, and time.     Cranial Nerves: No dysarthria.     Motor: Tremor present.     Coordination: Coordination normal.     Comments: Tremor right greater than left.    Psychiatric:        Attention and Perception: Attention and perception normal. She does not perceive auditory or visual hallucinations.        Mood and Affect: Mood is anxious and depressed. Mood is not elated. Affect is blunt. Affect is not labile, angry, tearful or inappropriate.        Speech: Speech normal.        Behavior: Behavior is not hyperactive. Behavior is cooperative.        Thought Content: Thought content normal. Thought content is not paranoid or delusional. Thought content does not include homicidal or suicidal ideation. Thought content does not include homicidal or suicidal plan.        Judgment: Judgment normal.  Comments: Insight is fair. More depressed than last time. Fidgety. Easily confused but appears better today. TD sx much improved but not resolved on the Austedo.  Less hyperactive. Very slight cogwheeling.       Lab Review:     Component Value Date/Time   NA 137 12/17/2017 1056   K 4.8  12/17/2017 1056   CL 98 12/17/2017 1056   CO2 23 12/17/2017 1056   GLUCOSE 89 12/17/2017 1056   GLUCOSE 87 06/06/2016 0959   BUN 15 12/17/2017 1056   CREATININE 0.64 12/17/2017 1056   CALCIUM 9.4 12/17/2017 1056   PROT 7.3 12/17/2017 1056   ALBUMIN 4.2 12/17/2017 1056   AST 30 12/17/2017 1056   ALT 26 12/17/2017 1056   ALKPHOS 77 12/17/2017 1056   BILITOT <0.2 12/17/2017 1056   GFRNONAA 90 12/17/2017 1056   GFRAA 104 12/17/2017 1056       Component Value Date/Time   WBC 6.7 12/17/2017 1056   WBC 6.1 06/06/2016 0959   RBC 4.27 12/17/2017 1056   RBC 4.64 06/06/2016 0959   HGB 13.1 12/17/2017 1056   HCT 38.2 12/17/2017 1056   PLT 329 12/17/2017 1056   MCV 90 12/17/2017 1056   MCH 30.7 12/17/2017 1056   MCH 30.0 06/06/2016 0959   MCHC 34.3 12/17/2017 1056   MCHC 33.7 06/06/2016 0959   RDW 14.0 12/17/2017 1056   LYMPHSABS 1.6 12/17/2017 1056   MONOABS 0.5 06/06/2016 0959   EOSABS 0.3 12/17/2017 1056   BASOSABS 0.0 12/17/2017 1056    No results found for: POCLITH, LITHIUM   No results found for: PHENYTOIN, PHENOBARB, VALPROATE, CBMZ   .res Assessment: Plan:    Bipolar I disorder ([place]) - Plan: Oxcarbazepine (TRILEPTAL) 300 MG tablet  Nausea without vomiting - Plan: promethazine (PHENERGAN) 25 MG tablet  Tardive dyskinesia  Generalized anxiety disorder - Plan: Oxcarbazepine (TRILEPTAL) 300 MG tablet  Panic disorder with agoraphobia - Plan: Oxcarbazepine (TRILEPTAL) 300 MG tablet  Mild cognitive impairment  Insomnia due to mental condition  Bruxism (teeth grinding)   Greater than 50% of face to face time with patient was spent on counseling and coordination of care. We discussed Ms. Corea is a very complicated and difficult to treat bipolar patient with tardive dyskinesia.  It is complicated by the presence as well of mild cognitive impairment.  In addition complicated by multiple med failures and very limited options for mood stabilization.  The option that we  are using still has a risk of drug drug interactions with the medication for tardive dyskinesia.  Her tardive dyskinesia symptoms are  improved after the switch from [name] 120 mg daily to Austedo 12 mg twice daily.  She is also less fidgety and has less tremor than last visit  .Marland Kitchen The mouth movements and tongue movements are improved but not resolved.  She does still have some balance problems which is partly related to deconditioning from laying in bed too much.  For racing thoughts, insomnia and anxiety continue oxcarbazepine to 1 in the morning and 2 at night of the 300 mg tablets. It helped.  It is more likely that the nausea is coming from meloxicam then it is coming from [place] but I cannot rule it out as being from status.  For nausea, Stop odansetron(Zofran) and start Phenergan as needed for nausea.  It is likely that the jaw clenching was related to the transition between [name] and [place] and now that that transition has been complete for a week and she  does not have cogwheel rigidity in her arms it is unlikely to recur off of the meloxicam.  Stop meloxicam to see if that is causing the nausea.  Also I do not believe the jaw clenching will come back now but if it does call the office.  Continue Austedo 12 mg tablets, 1 twice daily.   Continue oxcarbazepine 300 mg tablet 1 in the morning and 2 in the evening\  DC benztropine because it is ineffective for the jaw clenching  Spending too much time in bed.  Needs to stay out of bed more.  Inactivity is causing the weakness.  Needs more exercise and rec walking.  Starting therapy for her arm.  CO more anxious and depression and consider lamotrigine bc hesitant to increase paroxetine DT risk mood cycling.   We discussed the short-term risks associated with benzodiazepines including sedation and increased fall risk among others.  Discussed long-term side effect risk including dependence, potential withdrawal symptoms, and the potential  eventual dose-related risk of dementia. OK continue to Ativan 0.5 to 1 mg HS per her request.    This appt was 35 mins.  Follow-up 3 weeks  Lynder Parents, MD, DFAPA  Please see After Visit Summary for patient specific instructions.  Future Appointments  Date Time Provider [place]  07/08/2019  2:00 PM Cottle, Billey Co., MD [facility] None    No orders of the defined types were placed in this encounter.     -------------------------------

## 2019-06-16 NOTE — Patient Instructions (Addendum)
Stop odansetron(Zofran) and start Phenergan as needed for nausea.  Stop meloxicam to see if that is causing the nausea.  Also I do not believe the jaw clenching will come back now but if it does call the office.  Continue Austedo 12 mg tablets, 1 twice daily.   Continue oxcarbazepine 1 in the morning and 2 in the evening

## 2019-06-24 DIAGNOSIS — N6489 Other specified disorders of breast: Secondary | ICD-10-CM | POA: Diagnosis not present

## 2019-06-24 DIAGNOSIS — R928 Other abnormal and inconclusive findings on diagnostic imaging of breast: Secondary | ICD-10-CM | POA: Diagnosis not present

## 2019-06-29 DIAGNOSIS — Z03818 Encounter for observation for suspected exposure to other biological agents ruled out: Secondary | ICD-10-CM | POA: Diagnosis not present

## 2019-06-29 DIAGNOSIS — Z20828 Contact with and (suspected) exposure to other viral communicable diseases: Secondary | ICD-10-CM | POA: Diagnosis not present

## 2019-07-02 ENCOUNTER — Other Ambulatory Visit: Payer: Self-pay | Admitting: *Deleted

## 2019-07-02 DIAGNOSIS — R197 Diarrhea, unspecified: Secondary | ICD-10-CM

## 2019-07-02 NOTE — Telephone Encounter (Signed)
Patient is completely out of viberzi and needs refilled call in today. Patient states the pharmacy advised her they have sent refill x2 to [place]. I do not see request. Fowarding to pool. Patient uses [facility]

## 2019-07-06 ENCOUNTER — Telehealth: Payer: Self-pay | Admitting: Internal Medicine

## 2019-07-06 ENCOUNTER — Ambulatory Visit: Payer: Medicare Other | Admitting: Psychiatry

## 2019-07-06 MED ORDER — VIBERZI 75 MG PO TABS: 1.0000 | ORAL_TABLET | Freq: Two times a day (BID) | ORAL | 3 refills | Status: DC

## 2019-07-06 NOTE — Telephone Encounter (Signed)
Pt is upset because she has been out of her viberzi medication for a week and she called last Thursday and [facility] [place] [facility]. I told her we could try to get her samples, but she said she lives in [place], [place] and doesn't understand why we couldn't just send it in. I told her the nurse was aware.

## 2019-07-06 NOTE — Addendum Note (Signed)
Addended by: Gordy Levan, ERIC A on: 07/06/2019 02:53 PM   Modules accepted: Orders

## 2019-07-06 NOTE — Telephone Encounter (Signed)
Pt is aware that [facility] is send Viberzi into her pharmacy now.

## 2019-07-06 NOTE — Telephone Encounter (Signed)
Routing to the refill box. See previous phone note. Viberzi  75 mg bid.

## 2019-07-06 NOTE — Telephone Encounter (Signed)
Refill APP gone for the day. Patient appears to be out of medication. Refill sent to pharmacy for patient care needs.

## 2019-07-06 NOTE — Telephone Encounter (Signed)
noted 

## 2019-07-07 DIAGNOSIS — S6991XA Unspecified injury of right wrist, hand and finger(s), initial encounter: Secondary | ICD-10-CM | POA: Diagnosis not present

## 2019-07-07 DIAGNOSIS — S52501A Unspecified fracture of the lower end of right radius, initial encounter for closed fracture: Secondary | ICD-10-CM | POA: Diagnosis not present

## 2019-07-07 DIAGNOSIS — S52591A Other fractures of lower end of right radius, initial encounter for closed fracture: Secondary | ICD-10-CM | POA: Diagnosis not present

## 2019-07-07 DIAGNOSIS — S52331A Displaced oblique fracture of shaft of right radius, initial encounter for closed fracture: Secondary | ICD-10-CM | POA: Diagnosis not present

## 2019-07-07 NOTE — Telephone Encounter (Signed)
Pt is aware that [place] was sent to her pharmacy.

## 2019-07-08 ENCOUNTER — Other Ambulatory Visit: Payer: Self-pay

## 2019-07-08 ENCOUNTER — Encounter: Payer: Self-pay | Admitting: Psychiatry

## 2019-07-08 ENCOUNTER — Ambulatory Visit (INDEPENDENT_AMBULATORY_CARE_PROVIDER_SITE_OTHER): Payer: Medicare Other | Admitting: Psychiatry

## 2019-07-08 DIAGNOSIS — F319 Bipolar disorder, unspecified: Secondary | ICD-10-CM

## 2019-07-08 DIAGNOSIS — F4001 Agoraphobia with panic disorder: Secondary | ICD-10-CM | POA: Diagnosis not present

## 2019-07-08 DIAGNOSIS — F411 Generalized anxiety disorder: Secondary | ICD-10-CM

## 2019-07-08 DIAGNOSIS — F5105 Insomnia due to other mental disorder: Secondary | ICD-10-CM

## 2019-07-08 MED ORDER — LORAZEPAM 0.5 MG PO TABS: ORAL_TABLET | ORAL | 1 refills | Status: DC

## 2019-07-08 NOTE — Patient Instructions (Signed)
Increase carbidopa-levodopa to 2 in the morning and 1 in the afternoon and 1 in the evening for 4 days.  If tolerated but not helpful enough for balance then increase to 2 tablets 3 times daily.  For anxiety add Ativan 1 tablet 3 times daily

## 2019-07-08 NOTE — Progress Notes (Signed)
Annette Johnston [id number] 1946/02/26 73 y.o.    Subjective:   Patient ID:  Annette Johnston is a 73 y.o. (DOB [birth year]) female.  Chief Complaint:  Chief Complaint  Patient presents with  . Follow-up    Medication Management  . Anxiety    Medication Management  . Other    Bipolar 1    Anxiety Symptoms include decreased concentration, dizziness, nausea and nervous/anxious behavior. Patient reports no confusion or suicidal ideas.      Annette Johnston presents to the office today for follow-up of recent worsening TD after stopping [name]  When seen October 23, 2018.  No med changes were made as of that date.  She had just gotten back on the [name] 120 mg a day which was helpful for her tardive dyskinesia symptoms.  At visit Dec 23, 2018.  No meds were changed.  She was spending too much time in bed and she was encouraged to get out of bed.  When seen April 27, 2019.  Because of racing thoughts oxcarbazepine was increased to 300 mg in the morning and 600 mg at night.  It helped. She was having significant residual tardive dyskinesia symptoms on [name] at a high dosage but she did not want to change medicines at that time.  However since that time she called and wanted to make the change to Austedo.  There have been multiple phone calls since September 14.  She called October 6 complaining that her jaw was locking up.  A trial of benztropine 0.5 mg twice daily was ineffective.  She called October 12 stating the tardive dyskinesia symptoms were worse.  There was some concern that perhaps Trileptal was inducing metabolism of [name].  We started the switch from [name] to [place].  She was given lorazepam 0.5 mg 4 times daily for anxiety.  As of October 21 she had increased Austedo to 12 mg twice daily and felt it was helpful.  Last seen June 16, 2019 she was complaining of nausea.  Zofran was not effective.  She was given Phenergan.  There was a question as to whether it  was related to Austedo.  She was also encouraged to stop meloxicam.  Always been clumsy and fidgety but worse.  Falls too much including yesterday broke wrist with balance and dizzy feeling.  Shaking gotten worse.  Started grunting. 2 panic in the last 2 weeks.   Nausea 2 days since here.    Pretty good emotionally.  Can't do much since broke wrist yesterday.   Licking lips and twirling tongue in her mouth worse last couple of weeks but jaw is not locking up.  Chronically fidgety but worse in recent times.  Real agitated.  Had panic after fall yesterday.  Another panic about a week ago startiing with nausea and then SOB.    Episode of real shaking and thought she was having a seizure and went to there ER.  Has been twice and had Ativan and it calmed her down.  Was told it was anxiety and TD.  I'm addicted to the bed.  Chronic anxiety and some depression.  Stays in bed a lot.  Less mind racing and sleep some better.  Claims doesn't sleep during the day.  No interest.  Little activity except goes out to eat nightly.  Worries over stupid stuff.  But also worries over $ bc renter problems.  Wants more med for depression.  Shaking worse on right side and some balance problems.  Been  fine.  Just don't want to do anything and no get up and go but otherwose is OK.  Motivation low for about a year.  is sad and depressed.  Not crying as she did before Paroxetine. Not cleaning much and yet needs to do it. Watches TV and lays around.  H cooks and she washes dishes some.  Worse with tremors.  S  Thinks she slept better with the Ativan.    Pt reports that mood is Anxious, Depressed and Irritable and describes anxiety as Moderate. Anxiety symptoms include: Excessive Worry,. Pt reports has interrupted sleep and has frequent nighttime awakenings. Pt reports that appetite is good. Pt reports that energy is anergia, lethargic and poor and anhedonia, loss of interest or pleasure in usual activities, poor motivation and  withdrawn from usual activities. Concentration is difficulty with focus and attention. Suicidal thoughts:  denied by patient.  Note she's been taking 1 of 30 mg paroxetine daily not 15 mg as indicated previously in the chart.  Past Psychiatric Medication Trials: Depakote jerks, lithium tremor, risperidone side effects, Seroquel 300, remote history of ECT, Latuda, [name], paroxetine 30 for panic, Equetro good response, oxcarbazepine 600 partial response, Saphris, lorazepam, sertraline, [name] partial response   Review of Systems:  Review of Systems  Constitutional: Positive for fatigue.  Gastrointestinal: Positive for nausea.  Musculoskeletal: Positive for arthralgias. Negative for back pain.  Neurological: Positive for dizziness, tremors and weakness.       Poor balance  Psychiatric/Behavioral: Positive for agitation, decreased concentration and dysphoric mood. Negative for behavioral problems, confusion, hallucinations, self-injury, sleep disturbance and suicidal ideas. The patient is nervous/anxious. The patient is not hyperactive.     Medications: I have reviewed the patient's current medications.  Current Outpatient Medications  Medication Sig Dispense Refill  . carbidopa-levodopa (SINEMET IR) 25-100 MG tablet Take 1-2 tablets by mouth 3 (three) times daily before meals. 180 tablet 12  . Deutetrabenazine (AUSTEDO) 12 MG TABS Take 12 mg by mouth 2 (two) times daily. 60 tablet 2  . diphenhydramine-acetaminophen (TYLENOL PM) 25-500 MG TABS tablet Take 2 tablets by mouth at bedtime as needed.     . Eluxadoline (VIBERZI) 75 MG TABS Take 1 tablet by mouth 2 (two) times daily with a meal. 180 tablet 3  . levothyroxine (SYNTHROID, LEVOTHROID) 137 MCG tablet Take 137 mcg by mouth daily before breakfast.    . LORazepam (ATIVAN) 0.5 MG tablet 1 tablet 3 times daily and 1-2 tablets at night if needed for sleep 150 tablet 1  . Oxcarbazepine (TRILEPTAL) 300 MG tablet TAKE 1 TABLET BY MOUTH EVERY  MORNING AND 1 AND 1/2 TABLETS AT NIGHT 270 tablet 0  . PARoxetine (PAXIL) 30 MG tablet Take 1 tablet (30 mg total) by mouth daily. 90 tablet 1  . promethazine (PHENERGAN) 25 MG tablet Take 1 tablet (25 mg total) by mouth every 6 (six) hours as needed for nausea or vomiting. 45 tablet 0   No current facility-administered medications for this visit.     Medication Side Effects: None  Allergies:  Allergies  Allergen Reactions  . Latex Rash    Bandaids    Past Medical History:  Diagnosis Date  . Anxiety   . Bipolar 1 disorder ([place])   . Depression   . Dyspnea on exertion july 2011   Normal stress test dec 2010. Reportedl normal CXR 2011. CT abdomen lung cut 2005  - normal. Normal PFT - march 2012  . Hypothyroidism   . Obesity (BMI 30-39.9)  BMI 36 Feb 2012  . Urticaria     Family History  Problem Relation Age of Onset  . Alzheimer's disease Mother   . Heart attack Father   . Coronary artery disease Brother   . Heart attack Brother   . Alzheimer's disease Paternal Grandmother   . Allergic rhinitis Neg Hx   . Angioedema Neg Hx   . Asthma Neg Hx   . Atopy Neg Hx   . Eczema Neg Hx   . Immunodeficiency Neg Hx   . Urticaria Neg Hx     Social History   Socioeconomic History  . Marital status: Married    Spouse name: Annette Johnston  . Number of children: 1  . Years of education: Not on file  . Highest education level: High school graduate  Occupational History  . Occupation: retired  Scientific laboratory technician  . Financial resource strain: Not on file  . Food insecurity    Worry: Not on file    Inability: Not on file  . Transportation needs    Medical: Not on file    Non-medical: Not on file  Tobacco Use  . Smoking status: Never Smoker  . Smokeless tobacco: Never Used  Substance and Sexual Activity  . Alcohol use: Not Currently  . Drug use: No  . Sexual activity: Yes    Birth control/protection: Surgical  Lifestyle  . Physical activity    Days per week: Not on file    Minutes  per session: Not on file  . Stress: Not on file  Relationships  . Social Herbalist on phone: Not on file    Gets together: Not on file    Attends religious service: Not on file    Active member of club or organization: Not on file    Attends meetings of clubs or organizations: Not on file    Relationship status: Not on file  . Intimate partner violence    Fear of current or ex partner: Not on file    Emotionally abused: Not on file    Physically abused: Not on file    Forced sexual activity: Not on file  Other Topics Concern  . Not on file  Social History Narrative   Caffeine 1 cup per day    Lives at home with husband    Right Handed.     Past Medical History, Surgical history, Social history, and Family history were reviewed and updated as appropriate.   Please see review of systems for further details on the patient's review from today.   Objective:   Physical Exam:  There were no vitals taken for this visit.  Physical Exam Constitutional:      General: She is not in acute distress.    Appearance: She is well-developed.  Musculoskeletal:        General: No deformity.  Neurological:     Mental Status: She is alert and oriented to person, place, and time.     Cranial Nerves: No dysarthria.     Motor: Tremor present.     Coordination: Coordination abnormal.     Gait: Gait abnormal.     Comments: Tremor right greater than left.   Mild cogwheeling   Psychiatric:        Attention and Perception: Attention and perception normal. She does not perceive auditory or visual hallucinations.        Mood and Affect: Mood is anxious and depressed. Mood is not elated. Affect is not labile, blunt, angry,  tearful or inappropriate.        Speech: Speech normal.        Behavior: Behavior is not hyperactive. Behavior is cooperative.        Thought Content: Thought content normal. Thought content is not paranoid or delusional. Thought content does not include homicidal or  suicidal ideation. Thought content does not include homicidal or suicidal plan.        Judgment: Judgment normal.     Comments: Insight is fair. More nervous the last 2 weeks. Fidgety with legs worse moderately severe. Easily confused but appears better today. TD sx improved but not resolved on the Austedo.  Mild-moderate lip licking  Ruminative on renter problems.      Lab Review:     Component Value Date/Time   NA 137 12/17/2017 1056   K 4.8 12/17/2017 1056   CL 98 12/17/2017 1056   CO2 23 12/17/2017 1056   GLUCOSE 89 12/17/2017 1056   GLUCOSE 87 06/06/2016 0959   BUN 15 12/17/2017 1056   CREATININE 0.64 12/17/2017 1056   CALCIUM 9.4 12/17/2017 1056   PROT 7.3 12/17/2017 1056   ALBUMIN 4.2 12/17/2017 1056   AST 30 12/17/2017 1056   ALT 26 12/17/2017 1056   ALKPHOS 77 12/17/2017 1056   BILITOT <0.2 12/17/2017 1056   GFRNONAA 90 12/17/2017 1056   GFRAA 104 12/17/2017 1056       Component Value Date/Time   WBC 6.7 12/17/2017 1056   WBC 6.1 06/06/2016 0959   RBC 4.27 12/17/2017 1056   RBC 4.64 06/06/2016 0959   HGB 13.1 12/17/2017 1056   HCT 38.2 12/17/2017 1056   PLT 329 12/17/2017 1056   MCV 90 12/17/2017 1056   MCH 30.7 12/17/2017 1056   MCH 30.0 06/06/2016 0959   MCHC 34.3 12/17/2017 1056   MCHC 33.7 06/06/2016 0959   RDW 14.0 12/17/2017 1056   LYMPHSABS 1.6 12/17/2017 1056   MONOABS 0.5 06/06/2016 0959   EOSABS 0.3 12/17/2017 1056   BASOSABS 0.0 12/17/2017 1056    No results found for: POCLITH, LITHIUM   No results found for: PHENYTOIN, PHENOBARB, VALPROATE, CBMZ   .res Assessment: Plan:    Insomnia due to mental condition - Plan: LORazepam (ATIVAN) 0.5 MG tablet  Bipolar I disorder (HCC)  Generalized anxiety disorder  Panic disorder with agoraphobia   Greater than 50% of face to face time with patient was spent on counseling and coordination of care. We discussed Ms. Dileonardo is a very complicated and difficult to treat bipolar patient with  tardive dyskinesia.  It is complicated by the presence as well of mild cognitive impairment.  In addition complicated by multiple med failures and very limited options for mood stabilization.  The option that we are using still has a risk of drug drug interactions with the medication for tardive dyskinesia.  Her tardive dyskinesia symptoms are  improved after the switch from [name] 120 mg daily to [place] 12 mg twice daily.  She is also less fidgety and has less tremor than last visit  .Marland Kitchen The mouth movements and tongue movements are improved but not resolved.  She does still have some balance problems which is partly related to deconditioning from laying in bed too much.  For racing thoughts, insomnia and anxiety continue oxcarbazepine to 1 in the morning and 2 at night of the 300 mg tablets. Anxiety is still bad.    Nausea is better since here.  For nausea,  Phenergan as needed for nausea.  Used rarely without SE.  No longer jaw clenching.  Continue Austedo 12 mg tablets, 1 twice daily.   Spending too much time in bed.  Needs to stay out of bed more.  Inactivity is causing the weakness.  Needs more exercise and rec walking.  Starting therapy for her arm.  CO more anxious and depression and consider lamotrigine bc hesitant to increase paroxetine DT risk mood cycling.   We discussed the short-term risks associated with benzodiazepines including sedation and increased fall risk among others.  Discussed long-term side effect risk including dependence, potential withdrawal symptoms, and the potential eventual dose-related risk of dementia. OK continue to Ativan 0.5 to 1 mg HS per her request.    Consider [place], lamotrigine for mood.  Increase carbidopa-levodopa to 2 in the morning and 1 in the afternoon and 1 in the evening for 4 days.  If tolerated but not helpful enough for balance then increase to 2 tablets 3 times daily.  For anxiety add Ativan 1 tablet 3 times daily  This appt was 35  mins.  Follow-up 3 weeks  Lynder Parents, MD, DFAPA  Please see After Visit Summary for patient specific instructions.  Future Appointments  Date Time Provider [place]  07/29/2019  5:00 PM Cottle, Billey Co., MD [facility] None    No orders of the defined types were placed in this encounter.     -------------------------------

## 2019-07-13 DIAGNOSIS — G3184 Mild cognitive impairment, so stated: Secondary | ICD-10-CM | POA: Diagnosis not present

## 2019-07-13 DIAGNOSIS — E039 Hypothyroidism, unspecified: Secondary | ICD-10-CM | POA: Diagnosis not present

## 2019-07-14 DIAGNOSIS — W010XXA Fall on same level from slipping, tripping and stumbling without subsequent striking against object, initial encounter: Secondary | ICD-10-CM | POA: Diagnosis not present

## 2019-07-14 DIAGNOSIS — S52601A Unspecified fracture of lower end of right ulna, initial encounter for closed fracture: Secondary | ICD-10-CM | POA: Diagnosis not present

## 2019-07-14 DIAGNOSIS — S52501A Unspecified fracture of the lower end of right radius, initial encounter for closed fracture: Secondary | ICD-10-CM | POA: Diagnosis not present

## 2019-07-14 DIAGNOSIS — S52591A Other fractures of lower end of right radius, initial encounter for closed fracture: Secondary | ICD-10-CM | POA: Diagnosis not present

## 2019-07-27 ENCOUNTER — Other Ambulatory Visit: Payer: Self-pay | Admitting: Psychiatry

## 2019-07-29 ENCOUNTER — Ambulatory Visit (INDEPENDENT_AMBULATORY_CARE_PROVIDER_SITE_OTHER): Payer: Medicare Other | Admitting: Psychiatry

## 2019-07-29 ENCOUNTER — Encounter: Payer: Self-pay | Admitting: Internal Medicine

## 2019-07-29 ENCOUNTER — Encounter: Payer: Self-pay | Admitting: Psychiatry

## 2019-07-29 DIAGNOSIS — G3184 Mild cognitive impairment, so stated: Secondary | ICD-10-CM | POA: Diagnosis not present

## 2019-07-29 DIAGNOSIS — F4001 Agoraphobia with panic disorder: Secondary | ICD-10-CM

## 2019-07-29 DIAGNOSIS — F411 Generalized anxiety disorder: Secondary | ICD-10-CM | POA: Diagnosis not present

## 2019-07-29 DIAGNOSIS — G2401 Drug induced subacute dyskinesia: Secondary | ICD-10-CM | POA: Diagnosis not present

## 2019-07-29 DIAGNOSIS — F319 Bipolar disorder, unspecified: Secondary | ICD-10-CM

## 2019-07-29 DIAGNOSIS — F5105 Insomnia due to other mental disorder: Secondary | ICD-10-CM

## 2019-07-29 NOTE — Progress Notes (Signed)
Annette Johnston [id number] 05-03-1946 73 y.o.   Virtual Visit via [place]  I connected with pt by WebEx and verified that I am speaking with the correct person using two identifiers.   I discussed the limitations, risks, security and privacy concerns of performing an evaluation and management service by [name] and the availability of in person appointments. I also discussed with the patient that there may be a patient responsible charge related to this service. The patient expressed understanding and agreed to proceed.  I discussed the assessment and treatment plan with the patient. The patient was provided an opportunity to ask questions and all were answered. The patient agreed with the plan and demonstrated an understanding of the instructions.   The patient was advised to call back or seek an in-person evaluation if the symptoms worsen or if the condition fails to improve as anticipated.  I provided 15 minutes of video time during this encounter. The call started at 500 and ended at 515. The patient was located at home and the provider was located office.   Subjective:   Patient ID:  Annette Johnston is a 73 y.o. (DOB 1946-05-19) female.  Chief Complaint:  Chief Complaint  Patient presents with  . Follow-up    Medication Management  . Anxiety    Medication Management & med changes  . Other    Tardive dyskinesia    Anxiety Symptoms include decreased concentration. Patient reports no confusion, dizziness, nausea, nervous/anxious behavior or suicidal ideas.      Annette Johnston presents to the office today for follow-up of recent worsening TD after stopping [name]  When seen October 23, 2018.  No med changes were made as of that date.  She had just gotten back on the [name] 120 mg a day which was helpful for her tardive dyskinesia symptoms.  At visit Dec 23, 2018.  No meds were changed.  She was spending too much time in bed and she was encouraged to get out of bed.  When seen  April 27, 2019.  Because of racing thoughts oxcarbazepine was increased to 300 mg in the morning and 600 mg at night.  It helped. She was having significant residual tardive dyskinesia symptoms on [name] at a high dosage but she did not want to change medicines at that time.  However since that time she called and wanted to make the change to Austedo.  There have been multiple phone calls since September 14.  She called October 6 complaining that her jaw was locking up.  A trial of benztropine 0.5 mg twice daily was ineffective.  She called October 12 stating the tardive dyskinesia symptoms were worse.  There was some concern that perhaps Trileptal was inducing metabolism of [name].  We started the switch from [name] to [place].  She was given lorazepam 0.5 mg 4 times daily for anxiety.  As of October 21 she had increased Austedo to 12 mg twice daily and felt it was helpful.  When seen June 16, 2019 she was complaining of nausea.  Zofran was not effective.  She was given Phenergan.  There was a question as to whether it was related to Austedo.  She was also encouraged to stop meloxicam.  Last seen July 08, 2019.  The following changes were made: Increase carbidopa-levodopa to 2 in the morning and 1 in the afternoon and 1 in the evening for 4 days. If tolerated but not helpful enough for balance then increase to 2 tablets 3 times  daily. For anxiety add Ativan 1 tablet 3 times daily.  Doing better and less agitated with Ativan.  A lot of it was nerves.  Also less movement disorder with change in carbidopa-levodopa to 2 tablets 3 times daily.  Better balance.  Not in bed as much in the last 2 weeks.  I feel a big difference with the Ativan.  Less anxiety and panic.  Pretty good emotionally.  Can't do much since broke wrist yesterday.   Better motivation but energy is still not high productivity overall was not great but it is better than it was.  Appetite's okay.  Sleep is okay.   She is not having manic symptoms  Note she's been taking 1 of 30 mg paroxetine daily not 15 mg as indicated previously in the chart.  Past Psychiatric Medication Trials: Depakote jerks, lithium tremor, risperidone side effects, Seroquel 300, remote history of ECT, Latuda, [name], paroxetine 30 for panic, Equetro good response, oxcarbazepine 600 partial response, Saphris, lorazepam, sertraline, [name] partial response Lorazepam 0.5 3 times daily with good response  Review of Systems:  Review of Systems  Constitutional: Positive for fatigue.  Gastrointestinal: Negative for nausea.  Musculoskeletal: Positive for arthralgias. Negative for back pain.  Neurological: Positive for tremors and weakness. Negative for dizziness.       Poor balance  Psychiatric/Behavioral: Positive for decreased concentration. Negative for agitation, behavioral problems, confusion, dysphoric mood, hallucinations, self-injury, sleep disturbance and suicidal ideas. The patient is not nervous/anxious and is not hyperactive.     Medications: I have reviewed the patient's current medications.  Current Outpatient Medications  Medication Sig Dispense Refill  . AUSTEDO 12 MG TABS TAKE 1 TABLET BY MOUTH TWICE A DAY 60 tablet 2  . carbidopa-levodopa (SINEMET IR) 25-100 MG tablet Take 1-2 tablets by mouth 3 (three) times daily before meals. 180 tablet 12  . diphenhydramine-acetaminophen (TYLENOL PM) 25-500 MG TABS tablet Take 2 tablets by mouth at bedtime as needed.     . Eluxadoline (VIBERZI) 75 MG TABS Take 1 tablet by mouth 2 (two) times daily with a meal. 180 tablet 3  . levothyroxine (SYNTHROID, LEVOTHROID) 137 MCG tablet Take 137 mcg by mouth daily before breakfast.    . LORazepam (ATIVAN) 0.5 MG tablet 1 tablet 3 times daily and 1-2 tablets at night if needed for sleep 150 tablet 1  . Oxcarbazepine (TRILEPTAL) 300 MG tablet TAKE 1 TABLET BY MOUTH EVERY MORNING AND 1 AND 1/2 TABLETS AT NIGHT 270 tablet 0  . PARoxetine  (PAXIL) 30 MG tablet Take 1 tablet (30 mg total) by mouth daily. 90 tablet 1  . promethazine (PHENERGAN) 25 MG tablet Take 1 tablet (25 mg total) by mouth every 6 (six) hours as needed for nausea or vomiting. 45 tablet 0   No current facility-administered medications for this visit.    Medication Side Effects: None  Allergies:  Allergies  Allergen Reactions  . Latex Rash    Bandaids    Past Medical History:  Diagnosis Date  . Anxiety   . Bipolar 1 disorder ([place])   . Depression   . Dyspnea on exertion july 2011   Normal stress test dec 2010. Reportedl normal CXR 2011. CT abdomen lung cut 2005  - normal. Normal PFT - march 2012  . Hypothyroidism   . Obesity (BMI 30-39.9)    BMI 36 Feb 2012  . Urticaria     Family History  Problem Relation Age of Onset  . Alzheimer's disease Mother   . Heart  attack Father   . Coronary artery disease Brother   . Heart attack Brother   . Alzheimer's disease Paternal Grandmother   . Allergic rhinitis Neg Hx   . Angioedema Neg Hx   . Asthma Neg Hx   . Atopy Neg Hx   . Eczema Neg Hx   . Immunodeficiency Neg Hx   . Urticaria Neg Hx     Social History   Socioeconomic History  . Marital status: Married    Spouse name: Annette Johnston  . Number of children: 1  . Years of education: Not on file  . Highest education level: High school graduate  Occupational History  . Occupation: retired  Tobacco Use  . Smoking status: Never Smoker  . Smokeless tobacco: Never Used  Substance and Sexual Activity  . Alcohol use: Not Currently  . Drug use: No  . Sexual activity: Yes    Birth control/protection: Surgical  Other Topics Concern  . Not on file  Social History Narrative   Caffeine 1 cup per day    Lives at home with husband    Right Handed.    Social Determinants of Health   Financial Resource Strain:   . Difficulty of Paying Living Expenses: Not on file  Food Insecurity:   . Worried About Charity fundraiser in the Last Year: Not on file  .  Ran Out of Food in the Last Year: Not on file  Transportation Needs:   . Lack of Transportation (Medical): Not on file  . Lack of Transportation (Non-Medical): Not on file  Physical Activity:   . Days of Exercise per Week: Not on file  . Minutes of Exercise per Session: Not on file  Stress:   . Feeling of Stress : Not on file  Social Connections:   . Frequency of Communication with Friends and Family: Not on file  . Frequency of Social Gatherings with Friends and Family: Not on file  . Attends Religious Services: Not on file  . Active Member of Clubs or Organizations: Not on file  . Attends Archivist Meetings: Not on file  . Marital Status: Not on file  Intimate Partner Violence:   . Fear of Current or Ex-Partner: Not on file  . Emotionally Abused: Not on file  . Physically Abused: Not on file  . Sexually Abused: Not on file    Past Medical History, Surgical history, Social history, and Family history were reviewed and updated as appropriate.   Please see review of systems for further details on the patient's review from today.   Objective:   Physical Exam:  There were no vitals taken for this visit.  Physical Exam Neurological:     Mental Status: She is alert and oriented to person, place, and time.     Cranial Nerves: No dysarthria.  Psychiatric:        Attention and Perception: Attention and perception normal.        Mood and Affect: Mood is anxious and depressed.        Speech: Speech normal.        Behavior: Behavior is cooperative.        Thought Content: Thought content normal. Thought content is not paranoid or delusional. Thought content does not include homicidal or suicidal ideation. Thought content does not include homicidal or suicidal plan.        Cognition and Memory: Cognition and memory normal.        Judgment: Judgment normal.  Comments: Insight and judgment are fair.  Still significant tendency to worry.  He is less depressed.     Lab  Review:     Component Value Date/Time   NA 137 12/17/2017 1056   K 4.8 12/17/2017 1056   CL 98 12/17/2017 1056   CO2 23 12/17/2017 1056   GLUCOSE 89 12/17/2017 1056   GLUCOSE 87 06/06/2016 0959   BUN 15 12/17/2017 1056   CREATININE 0.64 12/17/2017 1056   CALCIUM 9.4 12/17/2017 1056   PROT 7.3 12/17/2017 1056   ALBUMIN 4.2 12/17/2017 1056   AST 30 12/17/2017 1056   ALT 26 12/17/2017 1056   ALKPHOS 77 12/17/2017 1056   BILITOT <0.2 12/17/2017 1056   GFRNONAA 90 12/17/2017 1056   GFRAA 104 12/17/2017 1056       Component Value Date/Time   WBC 6.7 12/17/2017 1056   WBC 6.1 06/06/2016 0959   RBC 4.27 12/17/2017 1056   RBC 4.64 06/06/2016 0959   HGB 13.1 12/17/2017 1056   HCT 38.2 12/17/2017 1056   PLT 329 12/17/2017 1056   MCV 90 12/17/2017 1056   MCH 30.7 12/17/2017 1056   MCH 30.0 06/06/2016 0959   MCHC 34.3 12/17/2017 1056   MCHC 33.7 06/06/2016 0959   RDW 14.0 12/17/2017 1056   LYMPHSABS 1.6 12/17/2017 1056   MONOABS 0.5 06/06/2016 0959   EOSABS 0.3 12/17/2017 1056   BASOSABS 0.0 12/17/2017 1056    No results found for: POCLITH, LITHIUM   No results found for: PHENYTOIN, PHENOBARB, VALPROATE, CBMZ   .res Assessment: Plan:    Bipolar I disorder ([place])  Generalized anxiety disorder  Panic disorder with agoraphobia  Tardive dyskinesia  Mild cognitive impairment  Insomnia due to mental condition   Greater than 50% of face to face time with patient was spent on counseling and coordination of care. We discussed Ms. Ady is a very complicated and difficult to treat bipolar patient with tardive dyskinesia.  It is complicated by the presence as well of mild cognitive impairment.  In addition complicated by multiple med failures and very limited options for mood stabilization.  The option that we are using still has a risk of drug drug interactions with the medication for tardive dyskinesia.  Overall she is dramatically calmer and has less movement disorder issues  Since the last appointment.  She is now satisfied with the medications.  Her tardive dyskinesia symptoms are  improved after the switch from [name] 120 mg daily to Austedo 12 mg twice daily.  She is also less fidgety and has less tremor than last visit  .Marland Kitchen The mouth movements and tongue movements are improved but not resolved.  She does still have some balance problems which is partly related to deconditioning from laying in bed too much.  For racing thoughts, insomnia and anxiety continue oxcarbazepine to 1 in the morning and 2 at night of the 300 mg tablets. Anxiety is still bad.    Nausea is better since here.  For nausea,  Phenergan as needed for nausea.  Used rarely without SE.  No longer jaw clenching.  Continue Austedo 12 mg tablets, 1 twice daily.   Spending too much time in bed.  Needs to stay out of bed more.  Inactivity is causing the weakness.  Needs more exercise and rec walking.  Starting therapy for her arm.  CO more anxious and depression and consider lamotrigine bc hesitant to increase paroxetine DT risk mood cycling.   We discussed the short-term risks associated  with benzodiazepines including sedation and increased fall risk among others.  Discussed long-term side effect risk including dependence, potential withdrawal symptoms, and the potential eventual dose-related risk of dementia. OK continue to Ativan 0.5 to 1 mg HS per her request.    Consider Caplyta, lamotrigine for mood.  Continue carbidopa-levodopa 2 tablets 3 times daily. She feels that this is helped reduce her movement disorder and tremor issues.  Continue Ativan 0.5 tablet 3 times daily.  She and her husband both note that she is markedly less agitated and calmer and she feels like this is made a huge difference.  No med changes today  Follow-up 6-8 weeks  Lynder Parents, MD, DFAPA  Please see After Visit Summary for patient specific instructions.  No future appointments.  No orders of the defined  types were placed in this encounter.     -------------------------------

## 2019-08-03 DIAGNOSIS — R05 Cough: Secondary | ICD-10-CM | POA: Diagnosis not present

## 2019-08-03 DIAGNOSIS — U071 COVID-19: Secondary | ICD-10-CM | POA: Diagnosis not present

## 2019-08-17 DIAGNOSIS — S79911A Unspecified injury of right hip, initial encounter: Secondary | ICD-10-CM | POA: Diagnosis not present

## 2019-08-17 DIAGNOSIS — M25551 Pain in right hip: Secondary | ICD-10-CM | POA: Diagnosis not present

## 2019-08-17 DIAGNOSIS — M1610 Unilateral primary osteoarthritis, unspecified hip: Secondary | ICD-10-CM | POA: Diagnosis not present

## 2019-08-20 ENCOUNTER — Other Ambulatory Visit: Payer: Self-pay | Admitting: Diagnostic Neuroimaging

## 2019-08-20 NOTE — Telephone Encounter (Signed)
Refilled x 6 months with note to pharmacy: have her call and schedule FU. FU due in April 2021.

## 2019-08-26 DIAGNOSIS — S52501D Unspecified fracture of the lower end of right radius, subsequent encounter for closed fracture with routine healing: Secondary | ICD-10-CM | POA: Diagnosis not present

## 2019-08-26 DIAGNOSIS — S52601D Unspecified fracture of lower end of right ulna, subsequent encounter for closed fracture with routine healing: Secondary | ICD-10-CM | POA: Diagnosis not present

## 2019-09-02 DIAGNOSIS — M5410 Radiculopathy, site unspecified: Secondary | ICD-10-CM | POA: Diagnosis not present

## 2019-09-02 DIAGNOSIS — M25551 Pain in right hip: Secondary | ICD-10-CM | POA: Diagnosis not present

## 2019-09-21 ENCOUNTER — Other Ambulatory Visit: Payer: Self-pay | Admitting: Psychiatry

## 2019-09-29 ENCOUNTER — Ambulatory Visit (INDEPENDENT_AMBULATORY_CARE_PROVIDER_SITE_OTHER): Payer: Medicare Other | Admitting: Psychiatry

## 2019-09-29 ENCOUNTER — Encounter: Payer: Self-pay | Admitting: Psychiatry

## 2019-09-29 ENCOUNTER — Other Ambulatory Visit: Payer: Self-pay

## 2019-09-29 DIAGNOSIS — G3184 Mild cognitive impairment, so stated: Secondary | ICD-10-CM | POA: Diagnosis not present

## 2019-09-29 DIAGNOSIS — F319 Bipolar disorder, unspecified: Secondary | ICD-10-CM | POA: Diagnosis not present

## 2019-09-29 DIAGNOSIS — F411 Generalized anxiety disorder: Secondary | ICD-10-CM | POA: Diagnosis not present

## 2019-09-29 DIAGNOSIS — F4001 Agoraphobia with panic disorder: Secondary | ICD-10-CM | POA: Diagnosis not present

## 2019-09-29 DIAGNOSIS — G2401 Drug induced subacute dyskinesia: Secondary | ICD-10-CM

## 2019-09-29 DIAGNOSIS — F458 Other somatoform disorders: Secondary | ICD-10-CM

## 2019-09-29 DIAGNOSIS — F5105 Insomnia due to other mental disorder: Secondary | ICD-10-CM | POA: Diagnosis not present
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
## 2019-10-23 ENCOUNTER — Ambulatory Visit (INDEPENDENT_AMBULATORY_CARE_PROVIDER_SITE_OTHER): Payer: Medicare Other | Admitting: Gastroenterology

## 2019-10-23 ENCOUNTER — Encounter: Payer: Self-pay | Admitting: Gastroenterology

## 2019-10-23 ENCOUNTER — Other Ambulatory Visit: Payer: Self-pay

## 2019-10-23 DIAGNOSIS — R197 Diarrhea, unspecified: Secondary | ICD-10-CM | POA: Diagnosis not present

## 2019-10-23 MED ORDER — VIBERZI 75 MG PO TABS: 1.0000 | ORAL_TABLET | Freq: Two times a day (BID) | ORAL | 1 refills | Status: DC
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
## 2019-10-23 NOTE — Patient Instructions (Signed)
We have refilled Viberzi for you.  Please call if any concerns in the meantime!  I hope you start feeling better.  We will see you in 1 year or sooner if needed. Your next colonoscopy will be in 2022.  I enjoyed talking with you again today! As you know, I value our relationship and want to provide genuine, compassionate, and quality care. I welcome your feedback. If you receive a survey regarding your visit,  I greatly appreciate you taking time to fill this out. See you next time!  Annitta Needs, PhD, ANP-BC [facility]

## 2019-10-30 DIAGNOSIS — Z23 Encounter for immunization: Secondary | ICD-10-CM | POA: Diagnosis not present

## 2019-11-24 DIAGNOSIS — Z90710 Acquired absence of both cervix and uterus: Secondary | ICD-10-CM | POA: Diagnosis not present

## 2019-11-24 DIAGNOSIS — R064 Hyperventilation: Secondary | ICD-10-CM | POA: Diagnosis not present

## 2019-11-24 DIAGNOSIS — F411 Generalized anxiety disorder: Secondary | ICD-10-CM | POA: Diagnosis not present

## 2019-11-24 DIAGNOSIS — F419 Anxiety disorder, unspecified: Secondary | ICD-10-CM | POA: Diagnosis not present

## 2019-11-24 DIAGNOSIS — F29 Unspecified psychosis not due to a substance or known physiological condition: Secondary | ICD-10-CM | POA: Diagnosis not present

## 2019-11-24 DIAGNOSIS — F410 Panic disorder [episodic paroxysmal anxiety] without agoraphobia: Secondary | ICD-10-CM | POA: Diagnosis not present

## 2019-11-24 DIAGNOSIS — Z79899 Other long term (current) drug therapy: Secondary | ICD-10-CM | POA: Diagnosis not present

## 2019-11-24 DIAGNOSIS — G20 Parkinson's disease: Secondary | ICD-10-CM | POA: Diagnosis not present

## 2019-11-24 DIAGNOSIS — R42 Dizziness and giddiness: Secondary | ICD-10-CM | POA: Diagnosis not present

## 2019-11-24 DIAGNOSIS — F319 Bipolar disorder, unspecified: Secondary | ICD-10-CM | POA: Diagnosis not present

## 2019-11-24 DIAGNOSIS — T17920A Food in respiratory tract, part unspecified causing asphyxiation, initial encounter: Secondary | ICD-10-CM | POA: Diagnosis not present

## 2019-11-24 DIAGNOSIS — R0689 Other abnormalities of breathing: Secondary | ICD-10-CM | POA: Diagnosis not present

## 2019-11-24 DIAGNOSIS — R0600 Dyspnea, unspecified: Secondary | ICD-10-CM | POA: Diagnosis not present

## 2019-11-25 ENCOUNTER — Other Ambulatory Visit: Payer: Self-pay | Admitting: Psychiatry

## 2019-11-25 ENCOUNTER — Telehealth: Payer: Self-pay | Admitting: Psychiatry

## 2019-11-25 DIAGNOSIS — F5105 Insomnia due to other mental disorder: Secondary | ICD-10-CM

## 2019-11-25 NOTE — Telephone Encounter (Signed)
Not sure if there will be any changes

## 2019-11-25 NOTE — Telephone Encounter (Signed)
Pt is having panic attacks again, Happened yesterday and was bad so she went to the ER. Was checked out and everything ok. They gave her Ativan then. She is having another panic attack today- breathless, can't catch breath, speech is hard, dizzy, shaking all over. She needs to know what to do. She does have Ativan availabe.

## 2019-11-25 NOTE — Telephone Encounter (Signed)
Sometimes she is noncompliant with medications because she forgets.  Make sure she is consistent with all the meds including the paroxetine 20 mg daily for panic disorder as well as that she is consistent with about 3-4 lorazepam a day.  If she is spending a lot of time in bed she be may be missing doses of lorazepam which can trigger withdrawal panic attacks.  If she has been consistent with meds and she needs to be on my cancellation list and try to get in as soon as possible.

## 2019-11-26 ENCOUNTER — Ambulatory Visit (INDEPENDENT_AMBULATORY_CARE_PROVIDER_SITE_OTHER): Payer: Medicare Other | Admitting: Psychiatry

## 2019-11-26 ENCOUNTER — Encounter: Payer: Self-pay | Admitting: Psychiatry

## 2019-11-26 DIAGNOSIS — F4001 Agoraphobia with panic disorder: Secondary | ICD-10-CM

## 2019-11-26 DIAGNOSIS — F319 Bipolar disorder, unspecified: Secondary | ICD-10-CM | POA: Diagnosis not present

## 2019-11-26 DIAGNOSIS — G3184 Mild cognitive impairment, so stated: Secondary | ICD-10-CM | POA: Diagnosis not present

## 2019-11-26 DIAGNOSIS — F5105 Insomnia due to other mental disorder: Secondary | ICD-10-CM

## 2019-11-26 DIAGNOSIS — F411 Generalized anxiety disorder: Secondary | ICD-10-CM | POA: Diagnosis not present

## 2019-11-26 DIAGNOSIS — G2401 Drug induced subacute dyskinesia: Secondary | ICD-10-CM | POA: Diagnosis not present

## 2019-11-26 DIAGNOSIS — F3131 Bipolar disorder, current episode depressed, mild: Secondary | ICD-10-CM | POA: Diagnosis not present

## 2019-11-26 MED ORDER — OXCARBAZEPINE 300 MG PO TABS: ORAL_TABLET | ORAL | 0 refills | Status: DC

## 2019-11-26 NOTE — Progress Notes (Signed)
Annette Johnston [id number] [birth year] 74 y.o.   Virtual Visit via [place]  I connected with pt by WebEx and verified that I am speaking with the correct person using two identifiers.   I discussed the limitations, risks, security and privacy concerns of performing an evaluation and management service by [name] and the availability of in person appointments. I also discussed with the patient that there may be a patient responsible charge related to this service. The patient expressed understanding and agreed to proceed.  I discussed the assessment and treatment plan with the patient. The patient was provided an opportunity to ask questions and all were answered. The patient agreed with the plan and demonstrated an understanding of the instructions.   The patient was advised to call back or seek an in-person evaluation if the symptoms worsen or if the condition fails to improve as anticipated.  I provided 30 minutes of video time during this encounter. The call started at 1130 and ended at 1200. The patient was located at home and the provider was located office.  Subjective:   Patient ID:  Annette Johnston is a 74 y.o. (DOB 1946-05-20) female.  Chief Complaint:  Chief Complaint  Patient presents with  . Anxiety    Recent worsening panic  . Follow-up  . Tardive dyskinesia    Anxiety Symptoms include decreased concentration. Patient reports no confusion, dizziness, nausea, nervous/anxious behavior or suicidal ideas.      Annette Johnston presents to the office today for follow-up of recent worsening TD after stopping [name]  When seen October 23, 2018.  No med changes were made as of that date.  She had just gotten back on the [name] 120 mg a day which was helpful for her tardive dyskinesia symptoms.  At visit Dec 23, 2018.  No meds were changed.  She was spending too much time in bed and she was encouraged to get out of bed.  When seen April 27, 2019.  Because of racing thoughts  oxcarbazepine was increased to 300 mg in the morning and 600 mg at night.  It helped. She was having significant residual tardive dyskinesia symptoms on [name] at a high dosage but she did not want to change medicines at that time.  However since that time she called and wanted to make the change to Austedo.  There have been multiple phone calls since September 14.  She called October 6 complaining that her jaw was locking up.  A trial of benztropine 0.5 mg twice daily was ineffective.  She called October 12 stating the tardive dyskinesia symptoms were worse.  There was some concern that perhaps Trileptal was inducing metabolism of [name].  We started the switch from [name] to [place].  She was given lorazepam 0.5 mg 4 times daily for anxiety.  As of October 21 she had increased Austedo to 12 mg twice daily and felt it was helpful.  When seen June 16, 2019 she was complaining of nausea.  Zofran was not effective.  She was given Phenergan.  There was a question as to whether it was related to Austedo.  She was also encouraged to stop meloxicam.  seen July 08, 2019.  The following changes were made: Increase carbidopa-levodopa to 2 in the morning and 1 in the afternoon and 1 in the evening for 4 days. If tolerated but not helpful enough for balance then increase to 2 tablets 3 times daily. For anxiety add Ativan 1 tablet 3 times daily.  Doing better  and less agitated with Ativan.  A lot of it was nerves.  Also less movement disorder with change in carbidopa-levodopa to 2 tablets 3 times daily.  Better balance.  Not in bed as much in the last 2 weeks.  I feel a big difference with the Ativan.  Less anxiety and panic.  seen July 29, 2019.  No meds were changed.  Overall she was satisfied with the meds at that time.  Last seen September 29, 2019.  She was having some racing thoughts and oxcarbazepine 300 mg was increased to 1 in the morning and 2 at night.  She continued paroxetine 30 mg  daily, lorazepam 0.5 mg 3 times daily to 4 times daily, Austedo 12 mg twice daily, Sinemet 25-100 mg 2 tablets twice daily.  She called April 14 complaining of severe panic and was given extra lorazepam.  She claims compliance with medications.  She was scheduled for an urgent appointment the following day.    As of November 26, 2019 she reports the following. Increased anxiety and panic.  Felt SOB, dizzy and called ER bc choking.  Another one yesterday.  ER workup was OK.  Given 1 mg Ativan to ER.  That calmed me down.  Still SOB and constantly grunting and tongue twirling.  Mild slurring.  Came on suddenly on Tuesday.  Called neuro but couldn't get appt until May. Balance is poor. Plenty of sleep. Not grunting at night. She is not having manic symptoms  Note she's been taking 1 of 30 mg paroxetine daily not 15 mg as indicated previously in the chart.  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  Past Psychiatric Medication Trials: Depakote jerks, lithium tremor, risperidone side effects, Seroquel 300, remote history of ECT, Latuda, [name], paroxetine 30 for panic, Equetro good response, oxcarbazepine 600 partial response, Saphris, lorazepam, sertraline, [name] partial response Lorazepam 0.5 3 times daily with good response  Review of Systems:  Review of Systems  Constitutional: Positive for fatigue.  Gastrointestinal: Negative for nausea.  Musculoskeletal: Positive for arthralgias. Negative for back pain.  Neurological: Positive for tremors and weakness. Negative for dizziness.       Poor balance  Psychiatric/Behavioral: Positive for decreased concentration. Negative for agitation, behavioral problems, confusion, dysphoric mood, hallucinations, self-injury, sleep disturbance and suicidal ideas. The patient is not nervous/anxious and is not hyperactive.     Medications: I have reviewed the patient's current medications.  Current Outpatient Medications  Medication  Sig Dispense Refill  . AUSTEDO 12 MG TABS TAKE 1 TABLET BY MOUTH TWICE A DAY 60 tablet 2  . carbidopa-levodopa (SINEMET IR) 25-100 MG tablet TAKE 1-2 TABLETS BY MOUTH 3 (THREE) TIMES DAILY BEFORE MEALS. (Patient taking differently: Take 2 tablets by mouth 2 (two) times daily. ) 540 tablet 1  . diphenhydramine-acetaminophen (TYLENOL PM) 25-500 MG TABS tablet Take 2 tablets by mouth at bedtime as needed.     . Eluxadoline (VIBERZI) 75 MG TABS Take 1 tablet by mouth 2 (two) times daily with a meal. 180 tablet 1  . levothyroxine (SYNTHROID, LEVOTHROID) 137 MCG tablet Take 137 mcg by mouth daily before breakfast.    . LORazepam (ATIVAN) 0.5 MG tablet Take 1 tablet (0.5 mg total) by mouth in the morning, at noon, in the evening, and at bedtime. (Patient taking differently: Take 0.5 mg by mouth in the morning, at noon, in the evening, and at bedtime. Usually 1 in AM and 2 at night) 120 tablet 1  .  Oxcarbazepine (TRILEPTAL) 300 MG tablet TAKE 1 TABLET BY MOUTH EVERY MORNING AND 2 TABLETS AT NIGHT 270 tablet 0  . PARoxetine (PAXIL) 30 MG tablet Take 1 tablet (30 mg total) by mouth daily. 90 tablet 1  . promethazine (PHENERGAN) 25 MG tablet Take 1 tablet (25 mg total) by mouth every 6 (six) hours as needed for nausea or vomiting. 45 tablet 0   No current facility-administered medications for this visit.    Medication Side Effects: None  Allergies:  Allergies  Allergen Reactions  . Latex Rash    Bandaids    Past Medical History:  Diagnosis Date  . Anxiety   . Bipolar 1 disorder ([place])   . Depression   . Dyspnea on exertion july 2011   Normal stress test dec 2010. Reportedl normal CXR 2011. CT abdomen lung cut 2005  - normal. Normal PFT - march 2012  . Hypothyroidism   . Obesity (BMI 30-39.9)    BMI 36 Feb 2012  . Urticaria     Family History  Problem Relation Age of Onset  . Alzheimer's disease Mother   . Heart attack Father   . Coronary artery disease Brother   . Heart attack Brother    . Alzheimer's disease Paternal Grandmother   . Allergic rhinitis Neg Hx   . Angioedema Neg Hx   . Asthma Neg Hx   . Atopy Neg Hx   . Eczema Neg Hx   . Immunodeficiency Neg Hx   . Urticaria Neg Hx   . Colon cancer Neg Hx     Social History   Socioeconomic History  . Marital status: Married    Spouse name: [name]  . Number of children: 1  . Years of education: Not on file  . Highest education level: High school graduate  Occupational History  . Occupation: retired  Tobacco Use  . Smoking status: Never Smoker  . Smokeless tobacco: Never Used  Substance and Sexual Activity  . Alcohol use: Not Currently  . Drug use: No  . Sexual activity: Yes    Birth control/protection: Surgical  Other Topics Concern  . Not on file  Social History Narrative   Caffeine 1 cup per day    Lives at home with husband    Right Handed.    Social Determinants of Health   Financial Resource Strain:   . Difficulty of Paying Living Expenses:   Food Insecurity:   . Worried About Charity fundraiser in the Last Year:   . Arboriculturist in the Last Year:   Transportation Needs:   . Film/video editor (Medical):   Marland Kitchen Lack of Transportation (Non-Medical):   Physical Activity:   . Days of Exercise per Week:   . Minutes of Exercise per Session:   Stress:   . Feeling of Stress :   Social Connections:   . Frequency of Communication with Friends and Family:   . Frequency of Social Gatherings with Friends and Family:   . Attends Religious Services:   . Active Member of Clubs or Organizations:   . Attends Archivist Meetings:   Marland Kitchen Marital Status:   Intimate Partner Violence:   . Fear of Current or Ex-Partner:   . Emotionally Abused:   Marland Kitchen Physically Abused:   . Sexually Abused:     Past Medical History, Surgical history, Social history, and Family history were reviewed and updated as appropriate.   Please see review of systems for further details on the  patient's review from today.    Objective:   Physical Exam:  There were no vitals taken for this visit.  Physical Exam Neurological:     Mental Status: She is alert and oriented to person, place, and time.     Cranial Nerves: No dysarthria.     Motor: Tremor present.     Comments: Still licks lips and puckers but it is better.  Overall less foot movements Tremor moderate positional  Psychiatric:        Attention and Perception: Attention and perception normal.        Mood and Affect: Mood is not anxious or depressed.        Speech: Speech normal.        Behavior: Behavior is cooperative.        Thought Content: Thought content normal. Thought content is not paranoid or delusional. Thought content does not include homicidal or suicidal ideation. Thought content does not include homicidal or suicidal plan.        Cognition and Memory: Cognition and memory normal.        Judgment: Judgment normal.     Comments: Insight and judgment are fair.  Still significant tendency to worry.  She is less depressed than last visit.     Lab Review:     Component Value Date/Time   NA 137 12/17/2017 1056   K 4.8 12/17/2017 1056   CL 98 12/17/2017 1056   CO2 23 12/17/2017 1056   GLUCOSE 89 12/17/2017 1056   GLUCOSE 87 06/06/2016 0959   BUN 15 12/17/2017 1056   CREATININE 0.64 12/17/2017 1056   CALCIUM 9.4 12/17/2017 1056   PROT 7.3 12/17/2017 1056   ALBUMIN 4.2 12/17/2017 1056   AST 30 12/17/2017 1056   ALT 26 12/17/2017 1056   ALKPHOS 77 12/17/2017 1056   BILITOT <0.2 12/17/2017 1056   GFRNONAA 90 12/17/2017 1056   GFRAA 104 12/17/2017 1056       Component Value Date/Time   WBC 6.7 12/17/2017 1056   WBC 6.1 06/06/2016 0959   RBC 4.27 12/17/2017 1056   RBC 4.64 06/06/2016 0959   HGB 13.1 12/17/2017 1056   HCT 38.2 12/17/2017 1056   PLT 329 12/17/2017 1056   MCV 90 12/17/2017 1056   MCH 30.7 12/17/2017 1056   MCH 30.0 06/06/2016 0959   MCHC 34.3 12/17/2017 1056   MCHC 33.7 06/06/2016 0959   RDW 14.0  12/17/2017 1056   LYMPHSABS 1.6 12/17/2017 1056   MONOABS 0.5 06/06/2016 0959   EOSABS 0.3 12/17/2017 1056   BASOSABS 0.0 12/17/2017 1056    No results found for: POCLITH, LITHIUM   No results found for: PHENYTOIN, PHENOBARB, VALPROATE, CBMZ   .res Assessment: Plan:    Panic disorder with agoraphobia - Plan: Oxcarbazepine (TRILEPTAL) 300 MG tablet  Bipolar affective disorder, currently depressed, mild (HCC)  Generalized anxiety disorder - Plan: Oxcarbazepine (TRILEPTAL) 300 MG tablet  Tardive dyskinesia  Mild cognitive impairment  Insomnia due to mental condition  Bipolar I disorder ([place]) - Plan: Oxcarbazepine (TRILEPTAL) 300 MG tablet   Greater than 50% of face to face time with patient was spent on counseling and coordination of care. We discussed Annette Johnston is a very complicated and difficult to treat bipolar patient with tardive dyskinesia.  It is complicated by the presence as well of mild cognitive impairment.  In addition complicated by multiple med failures and very limited options for mood stabilization.  The option that we are using still has a  risk of drug drug interactions with the medication for tardive dyskinesia.  Overall she is dramatically more anxious without a clear reason unless due to reduction by mistake of oxcarbazepine.    Her tardive dyskinesia symptoms are  improved after the switch from [name] 120 mg daily to Austedo 12 mg twice daily.  She is also less fidgety and has less tremor than last visit  .Marland Kitchen The mouth movements and tongue movements are improved but not resolved.  She does still have some balance problems which is partly related to deconditioning from laying in bed too much.  For  anxiety increase oxcarbazepine to 1 in the morning and 2 at night of the 300 mg tablets. Anxiety is still bad.    Increase lorazepam 0.5 mg QID  Nausea resolved.  Eating better.  No longer jaw clenching.  Continue Austedo 12 mg tablets, 1 twice daily.    Spending too much time in bed.  Needs to stay out of bed more.  Inactivity is causing the weakness.  Needs more exercise and rec walking.  Starting therapy for her arm.  If CO more anxious and depression,  consider lamotrigine bc hesitant to increase paroxetine DT risk mood cycling.   We discussed the short-term risks associated with benzodiazepines including sedation and increased fall risk among others.  Discussed long-term side effect risk including dependence, potential withdrawal symptoms, and the potential eventual dose-related risk of dementia. OK continue to Ativan 0.5 to 1 mg HS per her request.    Consider Caplyta, lamotrigine for mood.  Continue carbidopa-levodopa 2 tablets 2 times daily. She feels that this is helped reduce her movement disorder and tremor issues.  FU as schedule in May, or sooner  Lynder Parents, MD, DFAPA  Please see After Visit Summary for patient specific instructions.  Future Appointments  Date Time Provider [place]  12/15/2019 11:00 AM Penumalli, Earlean Polka, MD [facility] None  12/29/2019  2:00 PM Cottle, Billey Co., MD [facility] None    No orders of the defined types were placed in this encounter.     -------------------------------

## 2019-11-28 DIAGNOSIS — Z23 Encounter for immunization: Secondary | ICD-10-CM | POA: Diagnosis not present

## 2019-12-07 ENCOUNTER — Telehealth: Payer: Self-pay | Admitting: Psychiatry

## 2019-12-07 ENCOUNTER — Other Ambulatory Visit: Payer: Self-pay | Admitting: Psychiatry

## 2019-12-07 DIAGNOSIS — F5105 Insomnia due to other mental disorder: Secondary | ICD-10-CM

## 2019-12-07 MED ORDER — LORAZEPAM 0.5 MG PO TABS: 0.5000 mg | ORAL_TABLET | Freq: Four times a day (QID) | ORAL | 0 refills | Status: DC

## 2019-12-07 NOTE — Telephone Encounter (Signed)
Rx sent 

## 2019-12-07 NOTE — Telephone Encounter (Signed)
Pt called stated she had called this morning left message about new aid has stolen her Ativan. She is in panic about not having any for tonight.  Please advise pt if new Rx can be sent in. She knows if will be out of pocket cost. [phone]

## 2019-12-07 NOTE — Telephone Encounter (Signed)
Patient called and said that she needs a refill on her ativan. She filled it on 4/14. However she said that she has an aide that helps her and she thinks that the aid took her mediicne because she is out. She knows that she will have to pay out of pocket. [facility] [address] in [place]

## 2019-12-14 ENCOUNTER — Other Ambulatory Visit: Payer: Self-pay | Admitting: Psychiatry

## 2019-12-14 DIAGNOSIS — F4001 Agoraphobia with panic disorder: Secondary | ICD-10-CM

## 2019-12-14 DIAGNOSIS — F319 Bipolar disorder, unspecified: Secondary | ICD-10-CM

## 2019-12-14 DIAGNOSIS — F411 Generalized anxiety disorder: Secondary | ICD-10-CM

## 2019-12-15 ENCOUNTER — Ambulatory Visit (INDEPENDENT_AMBULATORY_CARE_PROVIDER_SITE_OTHER): Payer: Medicare Other | Admitting: Diagnostic Neuroimaging

## 2019-12-15 ENCOUNTER — Other Ambulatory Visit: Payer: Self-pay

## 2019-12-15 ENCOUNTER — Encounter: Payer: Self-pay | Admitting: Diagnostic Neuroimaging

## 2019-12-15 VITALS — BP 126/72 | HR 80 | Temp 97.3°F | Ht 67.0 in | Wt 203.0 lb

## 2019-12-15 DIAGNOSIS — G2401 Drug induced subacute dyskinesia: Secondary | ICD-10-CM

## 2019-12-15 DIAGNOSIS — R110 Nausea: Secondary | ICD-10-CM | POA: Diagnosis not present

## 2019-12-15 DIAGNOSIS — G20 Parkinson's disease: Secondary | ICD-10-CM | POA: Diagnosis not present

## 2019-12-15 DIAGNOSIS — G20C Parkinsonism, unspecified: Secondary | ICD-10-CM

## 2019-12-15 MED ORDER — PROMETHAZINE HCL 25 MG PO TABS: 25.0000 mg | ORAL_TABLET | Freq: Three times a day (TID) | ORAL | 6 refills | Status: DC | PRN

## 2019-12-15 MED ORDER — CARBIDOPA-LEVODOPA 25-100 MG PO TABS: 2.0000 | ORAL_TABLET | Freq: Two times a day (BID) | ORAL | 12 refills | Status: DC

## 2019-12-15 NOTE — Patient Instructions (Signed)
PARKINSON'S DISEASE  - continue carb/levo 2 tabs twice a day - use rollator walker; recommend PT - second opinion at movement disorder center  NAUSEA (could be related to parkinson's disease or medication side effect) - continue phenergan as needed; follow up with GI  TARDIVE DYSKINESIA  - follow up with Dr. Clovis Pu re: Annette Johnston - second opinion at movement disorder center

## 2019-12-15 NOTE — Progress Notes (Signed)
Chief Complaint  Patient presents with  . [name]    rm 7, one year FU "nausea, grunting, tremors, 2 falls"    History of Present Illness:  UPDATE (12/15/19, [name]): Since last visit, in last 1 month more tardive dyskinesia (mouth movements) and more nausea. Lost her austedo 1 month ago, then had panic attack and went to [facility] ER. Tolerating carb/levo 2 tabs twice a day.   UPDATE (07/16/18, [name]): Since last visit, had DATscan slightly positive, and then started on carb/levo, which is helping tremor in hands. Movements in mouth, tongue and legs continue. Continues on ingrezza.  PRIOR HPI (04/01/18): 74 year old female here for evaluation of abnormal involuntary movements.  Patient has a long history of depression, anxiety, bipolar disorder, has been on numerous psychiatry medications.  About 1 to 2 years ago she was started on [name], and after 6 to 12 months she developed abnormal involuntary movements of her mouth, tongue and arms.  [name] was stopped, but movements continued and worsened.  She has significant tremor and movements on her right arm, right leg, tongue and mouth.  Patient was started on medication [name] and symptoms have started to improve.  Symptoms have been managed by psychiatrist.  Patient saw PCP who recommended neurology consult.  Patient reports history of "nervous breakdown" in 1972, status post behavioral hospitalization and ECT treatments.    Observations/Objective:  GENERAL EXAM/CONSTITUTIONAL: Vitals:  Vitals:   12/15/19 1054  BP: 126/72  Pulse: 80  Temp: (!) 97.3 F (36.3 C)  Weight: 203 lb (92.1 kg)  Height: 5\' 7"  (1.702 m)     Body mass index is 31.79 kg/m. Wt Readings from Last 3 Encounters:  12/15/19 203 lb (92.1 kg)  09/09/18 198 lb 9.6 oz (90.1 kg)  07/16/18 206 lb 9.6 oz (93.7 kg)     Patient is in no distress; well developed, nourished and groomed; neck is supple  CARDIOVASCULAR:  Examination  of carotid arteries is normal; no carotid bruits  Regular rate and rhythm, no murmurs  Examination of peripheral vascular system by observation and palpation is normal  EYES:  Ophthalmoscopic exam of optic discs and posterior segments is normal; no papilledema or hemorrhages  No exam data present  MUSCULOSKELETAL:  Gait, strength, tone, movements noted in Neurologic exam below  NEUROLOGIC: MENTAL STATUS:  No flowsheet data found.  awake, alert, oriented to person, place and time  recent and remote memory intact  normal attention and concentration  language fluent, comprehension intact, naming intact  fund of knowledge appropriate  CRANIAL NERVE:   2nd - no papilledema on fundoscopic exam  2nd, 3rd, 4th, 6th - pupils equal and reactive to light, visual fields full to confrontation, extraocular muscles intact, no nystagmus  5th - facial sensation symmetric  7th - facial strength symmetric  8th - hearing intact  9th - palate elevates symmetrically, uvula midline  11th - shoulder shrug symmetric  12th - tongue protrusion midline  MOTOR:   NO RESTING TREMOR  NO COGWHEELING RIGIDITY  MILD BRADYKINESIA IN BUE  AKATHESIA AND LEG ROCKING MOVEMENTS  MILD MOUTH TREMOR  normal bulk and tone, full strength in the BUE, BLE  SENSORY:   normal and symmetric to light touch  COORDINATION:   finger-nose-finger, fine finger movements normal  REFLEXES:   deep tendon reflexes present and symmetric  GAIT/STATION:   SHORT STEPS; UNSTEADY   04/17/18 MRI brain - Unremarkable MRI brain (without). No acute findings.  05/22/18 datSCAN -  Subtle loss of activity in the posterior putamen greater on the RIGHT is favored Parkinson's syndrome pathology pattern.   Assessment and Plan:  74 y.o. female with:   Dx: tardive dyskinesia (moderate)+ parkinson's disease (mild)   PARKINSON'S DISEASE  - continue carb/levo 2 tabs twice a day - use rollator walker;  recommend PT - second opinion at movement disorder center  NAUSEA (could be related to parkinson's disease or medication side effect) - continue phenergan as needed; follow up with GI  TARDIVE DYSKINESIA  - follow up with Dr. Clovis Pu re: Renee Harder - second opinion at movement disorder center   Meds ordered this encounter  Medications  . promethazine (PHENERGAN) 25 MG tablet    Sig: Take 1 tablet (25 mg total) by mouth every 8 (eight) hours as needed for nausea or vomiting.    Dispense:  30 tablet    Refill:  6  . carbidopa-levodopa (SINEMET IR) 25-100 MG tablet    Sig: Take 2 tablets by mouth 2 (two) times daily.    Dispense:  120 tablet    Refill:  12   Orders Placed This Encounter  Procedures  . Ambulatory referral to Neurology    Follow Up Instructions:  - Return in about 1 year (around 12/14/2020).      [name], MD [id number], [id number] AM Certified in Neurology, Neurophysiology and Neuroimaging  [facility] [address], [place], [place] [address] [phone]

## 2019-12-22 DIAGNOSIS — D485 Neoplasm of uncertain behavior of skin: Secondary | ICD-10-CM | POA: Diagnosis not present

## 2019-12-22 DIAGNOSIS — Z85828 Personal history of other malignant neoplasm of skin: Secondary | ICD-10-CM | POA: Diagnosis not present

## 2019-12-22 DIAGNOSIS — L570 Actinic keratosis: Secondary | ICD-10-CM | POA: Diagnosis not present

## 2019-12-28 ENCOUNTER — Other Ambulatory Visit: Payer: Self-pay | Admitting: Psychiatry

## 2019-12-28 ENCOUNTER — Telehealth: Payer: Self-pay | Admitting: Psychiatry

## 2019-12-28 ENCOUNTER — Other Ambulatory Visit: Payer: Self-pay

## 2019-12-28 DIAGNOSIS — L570 Actinic keratosis: Secondary | ICD-10-CM | POA: Diagnosis not present

## 2019-12-28 MED ORDER — AUSTEDO 12 MG PO TABS: 1.0000 | ORAL_TABLET | Freq: Two times a day (BID) | ORAL | 2 refills | Status: DC

## 2019-12-28 NOTE — Telephone Encounter (Signed)
Pt called to advise Austedo 12 mg is out of refills and per [place] must be called in today to ship by Wednesday. She is out on Wednesday.

## 2019-12-28 NOTE — Telephone Encounter (Signed)
Rx for Austedo 12 mg bid submitted to [facility]. Has apt tomorrow with Dr. Clovis Pu

## 2019-12-29 ENCOUNTER — Other Ambulatory Visit: Payer: Self-pay

## 2019-12-29 ENCOUNTER — Encounter: Payer: Self-pay | Admitting: Psychiatry

## 2019-12-29 ENCOUNTER — Ambulatory Visit (INDEPENDENT_AMBULATORY_CARE_PROVIDER_SITE_OTHER): Payer: Medicare Other | Admitting: Psychiatry

## 2019-12-29 DIAGNOSIS — F4001 Agoraphobia with panic disorder: Secondary | ICD-10-CM | POA: Diagnosis not present

## 2019-12-29 DIAGNOSIS — F411 Generalized anxiety disorder: Secondary | ICD-10-CM | POA: Diagnosis not present

## 2019-12-29 DIAGNOSIS — G2401 Drug induced subacute dyskinesia: Secondary | ICD-10-CM

## 2019-12-29 DIAGNOSIS — F5105 Insomnia due to other mental disorder: Secondary | ICD-10-CM | POA: Diagnosis not present

## 2019-12-29 DIAGNOSIS — F3131 Bipolar disorder, current episode depressed, mild: Secondary | ICD-10-CM

## 2019-12-29 NOTE — Progress Notes (Signed)
Annette Johnston [id number] [birth year] 74 y.o.   Virtual Visit via [place]  I connected with pt by WebEx and verified that I am speaking with the correct person using two identifiers.   I discussed the limitations, risks, security and privacy concerns of performing an evaluation and management service by [name] and the availability of in person appointments. I also discussed with the patient that there may be a patient responsible charge related to this service. The patient expressed understanding and agreed to proceed.  I discussed the assessment and treatment plan with the patient. The patient was provided an opportunity to ask questions and all were answered. The patient agreed with the plan and demonstrated an understanding of the instructions.   The patient was advised to call back or seek an in-person evaluation if the symptoms worsen or if the condition fails to improve as anticipated.  I provided 30 minutes of video time during this encounter. The call started at 1130 and ended at 1200. The patient was located at home and the provider was located office.  Subjective:   Patient ID:  Annette Johnston is a 74 y.o. (DOB [birth year]) female.  Chief Complaint:  Chief Complaint  Patient presents with  . Follow-up  . Anxiety  . Depression  . tardive dyskinesia    Anxiety Symptoms include decreased concentration. Patient reports no confusion, dizziness, nausea, nervous/anxious behavior or suicidal ideas.      Annette Johnston presents to the office today for follow-up of recent worsening TD after stopping [name]  When seen October 23, 2018.  No med changes were made as of that date.  She had just gotten back on the [name] 120 mg a day which was helpful for her tardive dyskinesia symptoms.  At visit Dec 23, 2018.  No meds were changed.  She was spending too much time in bed and she was encouraged to get out of bed.  When seen April 27, 2019.  Because of racing thoughts oxcarbazepine  was increased to 300 mg in the morning and 600 mg at night.  It helped. She was having significant residual tardive dyskinesia symptoms on [name] at a high dosage but she did not want to change medicines at that time.  However since that time she called and wanted to make the change to Austedo.  There have been multiple phone calls since September 14.  She called October 6 complaining that her jaw was locking up.  A trial of benztropine 0.5 mg twice daily was ineffective.  She called October 12 stating the tardive dyskinesia symptoms were worse.  There was some concern that perhaps Trileptal was inducing metabolism of [name].  We started the switch from [name] to [name].  She was given lorazepam 0.5 mg 4 times daily for anxiety.  As of October 21 she had increased Austedo to 12 mg twice daily and felt it was helpful.  When seen June 16, 2019 she was complaining of nausea.  Zofran was not effective.  She was given Phenergan.  There was a question as to whether it was related to Austedo.  She was also encouraged to stop meloxicam.  seen July 08, 2019.  The following changes were made: Increase carbidopa-levodopa to 2 in the morning and 1 in the afternoon and 1 in the evening for 4 days. If tolerated but not helpful enough for balance then increase to 2 tablets 3 times daily. For anxiety add Ativan 1 tablet 3 times daily.  Doing better and less agitated  with Ativan.  A lot of it was nerves.  Also less movement disorder with change in carbidopa-levodopa to 2 tablets 3 times daily.  Better balance.  Not in bed as much in the last 2 weeks.  I feel a big difference with the Ativan.  Less anxiety and panic.  seen July 29, 2019.  No meds were changed.  Overall she was satisfied with the meds at that time.  seen September 29, 2019.  She was having some racing thoughts and oxcarbazepine 300 mg was increased to 1 in the morning and 2 at night.  She continued paroxetine 30 mg daily, lorazepam  0.5 mg 3 times daily to 4 times daily, Austedo 12 mg twice daily, Sinemet 25-100 mg 2 tablets twice daily.  She called April 14 complaining of severe panic and was given extra lorazepam.  She claims compliance with medications.  She was scheduled for an urgent appointment the following day.    As of November 26, 2019 she reports the following. Increased anxiety and panic.  Felt SOB, dizzy and called ER bc choking.  Another one yesterday.  ER workup was OK.  Given 1 mg Ativan to ER.  That calmed me down.  Still SOB and constantly grunting and tongue twirling.  Mild slurring.  Came on suddenly on Tuesday.  Called neuro but couldn't get appt until May. Balance is poor. Plenty of sleep. Not grunting at night. She is not having manic symptoms  Note she's been taking 1 of 30 mg paroxetine daily not 15 mg as indicated previously in the chart. Plan: For  anxiety increase oxcarbazepine to 1 in the morning and 2 at night of the 300 mg tablets. Anxiety is still bad.   Increase lorazepam 0.5 mg QID Continue Austedo 12 mg tablets, 1 twice daily.  Continue carbidopa levodopa 25-100 mg 2 tablets twice daily.  12/29/2019 appointment, the following is noted: Started grunting when gets agitated.  Fidgety. Had panic after accidentally stopping Austedo for a week.  Better anxiety back on her meds.  Worsening tongue movements and sucking lip.   Nausea resolved when split up meds.  Upset hasn't seen daughter in 3 years and hasn't called last 2 mother's days.   No mania and depression is manageable.  Sleep is fine.  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  Past Psychiatric Medication Trials: Depakote jerks, lithium tremor, risperidone side effects, Seroquel 300, remote history of ECT, Latuda, [name], paroxetine 30 for panic, Equetro good response, oxcarbazepine 600 partial response, Saphris, lorazepam, sertraline, [name] partial response Austedo 12 BID Lorazepam 0.5 3 times daily with  good response  Review of Systems:  Review of Systems  Constitutional: Positive for fatigue.  Gastrointestinal: Negative for nausea.  Musculoskeletal: Positive for arthralgias. Negative for back pain.  Neurological: Positive for tremors and weakness. Negative for dizziness.       Poor balance  Psychiatric/Behavioral: Positive for decreased concentration. Negative for agitation, behavioral problems, confusion, dysphoric mood, hallucinations, self-injury, sleep disturbance and suicidal ideas. The patient is not nervous/anxious and is not hyperactive.     Medications: I have reviewed the patient's current medications.  Current Outpatient Medications  Medication Sig Dispense Refill  . AUSTEDO 12 MG TABS TAKE 1 TABLET BY MOUTH TWICE A DAY 60 tablet 2  . carbidopa-levodopa (SINEMET IR) 25-100 MG tablet Take 2 tablets by mouth 2 (two) times daily. 120 tablet 12  . diphenhydramine-acetaminophen (TYLENOL PM) 25-500 MG TABS tablet Take 2 tablets by  mouth at bedtime as needed.     . Eluxadoline (VIBERZI) 75 MG TABS Take 1 tablet by mouth 2 (two) times daily with a meal. 180 tablet 1  . levothyroxine (SYNTHROID, LEVOTHROID) 137 MCG tablet Take 137 mcg by mouth daily before breakfast.    . LORazepam (ATIVAN) 0.5 MG tablet Take 1 tablet (0.5 mg total) by mouth in the morning, at noon, in the evening, and at bedtime. 120 tablet 0  . Oxcarbazepine (TRILEPTAL) 300 MG tablet TAKE 1 TABLET BY MOUTH EVERY MORNING AND 2 TABLETS AT NIGHT 270 tablet 0  . PARoxetine (PAXIL) 30 MG tablet Take 1 tablet (30 mg total) by mouth daily. 90 tablet 1  . promethazine (PHENERGAN) 25 MG tablet Take 1 tablet (25 mg total) by mouth every 8 (eight) hours as needed for nausea or vomiting. 30 tablet 6   No current facility-administered medications for this visit.    Medication Side Effects: None  Allergies:  Allergies  Allergen Reactions  . Latex Rash    Bandaids    Past Medical History:  Diagnosis Date  . Anxiety    . Bipolar 1 disorder ([place])   . Depression   . Dyspnea on exertion july 2011   Normal stress test dec 2010. Reportedl normal CXR 2011. CT abdomen lung cut 2005  - normal. Normal PFT - march 2012  . Hypothyroidism   . Obesity (BMI 30-39.9)    BMI 36 Feb 2012  . Urticaria     Family History  Problem Relation Age of Onset  . Alzheimer's disease Mother   . Heart attack Father   . Coronary artery disease Brother   . Heart attack Brother   . Alzheimer's disease Paternal Grandmother   . Allergic rhinitis Neg Hx   . Angioedema Neg Hx   . Asthma Neg Hx   . Atopy Neg Hx   . Eczema Neg Hx   . Immunodeficiency Neg Hx   . Urticaria Neg Hx   . Colon cancer Neg Hx     Social History   Socioeconomic History  . Marital status: Married    Spouse name: [name]  . Number of children: 1  . Years of education: Not on file  . Highest education level: High school graduate  Occupational History  . Occupation: retired  Tobacco Use  . Smoking status: Never Smoker  . Smokeless tobacco: Never Used  Substance and Sexual Activity  . Alcohol use: Not Currently  . Drug use: No  . Sexual activity: Yes    Birth control/protection: Surgical  Other Topics Concern  . Not on file  Social History Narrative   Caffeine 1 cup per day    Lives at home with husband    Right Handed.    Social Determinants of Health   Financial Resource Strain:   . Difficulty of Paying Living Expenses:   Food Insecurity:   . Worried About Charity fundraiser in the Last Year:   . Arboriculturist in the Last Year:   Transportation Needs:   . Film/video editor (Medical):   Marland Kitchen Lack of Transportation (Non-Medical):   Physical Activity:   . Days of Exercise per Week:   . Minutes of Exercise per Session:   Stress:   . Feeling of Stress :   Social Connections:   . Frequency of Communication with Friends and Family:   . Frequency of Social Gatherings with Friends and Family:   . Attends Religious Services:   .  Active Member of Clubs or Organizations:   . Attends Archivist Meetings:   Marland Kitchen Marital Status:   Intimate Partner Violence:   . Fear of Current or Ex-Partner:   . Emotionally Abused:   Marland Kitchen Physically Abused:   . Sexually Abused:     Past Medical History, Surgical history, Social history, and Family history were reviewed and updated as appropriate.   Please see review of systems for further details on the patient's review from today.   Objective:   Physical Exam:  There were no vitals taken for this visit.  Physical Exam Neurological:     Mental Status: She is alert and oriented to person, place, and time.     Cranial Nerves: No dysarthria.     Motor: Tremor present.     Gait: Gait abnormal.     Comments: Still licks lips and puckers but it is better with meds.  More fidgety foot movements. Some grunting. Tremor moderate positional Stiff gait with reasonable pace.  Psychiatric:        Attention and Perception: Attention and perception normal.        Mood and Affect: Mood is anxious. Mood is not depressed.        Speech: Speech normal.        Behavior: Behavior is cooperative.        Thought Content: Thought content normal. Thought content is not paranoid or delusional. Thought content does not include homicidal or suicidal ideation. Thought content does not include homicidal or suicidal plan.        Cognition and Memory: Cognition and memory normal.        Judgment: Judgment normal.     Comments: Insight and judgment are fair.  Still significant tendency to worry.  She is less depressed than at times.     Lab Review:     Component Value Date/Time   NA 137 12/17/2017 1056   K 4.8 12/17/2017 1056   CL 98 12/17/2017 1056   CO2 23 12/17/2017 1056   GLUCOSE 89 12/17/2017 1056   GLUCOSE 87 06/06/2016 0959   BUN 15 12/17/2017 1056   CREATININE 0.64 12/17/2017 1056   CALCIUM 9.4 12/17/2017 1056   PROT 7.3 12/17/2017 1056   ALBUMIN 4.2 12/17/2017 1056   AST 30  12/17/2017 1056   ALT 26 12/17/2017 1056   ALKPHOS 77 12/17/2017 1056   BILITOT <0.2 12/17/2017 1056   GFRNONAA 90 12/17/2017 1056   GFRAA 104 12/17/2017 1056       Component Value Date/Time   WBC 6.7 12/17/2017 1056   WBC 6.1 06/06/2016 0959   RBC 4.27 12/17/2017 1056   RBC 4.64 06/06/2016 0959   HGB 13.1 12/17/2017 1056   HCT 38.2 12/17/2017 1056   PLT 329 12/17/2017 1056   MCV 90 12/17/2017 1056   MCH 30.7 12/17/2017 1056   MCH 30.0 06/06/2016 0959   MCHC 34.3 12/17/2017 1056   MCHC 33.7 06/06/2016 0959   RDW 14.0 12/17/2017 1056   LYMPHSABS 1.6 12/17/2017 1056   MONOABS 0.5 06/06/2016 0959   EOSABS 0.3 12/17/2017 1056   BASOSABS 0.0 12/17/2017 1056    No results found for: POCLITH, LITHIUM   No results found for: PHENYTOIN, PHENOBARB, VALPROATE, CBMZ   .res Assessment: Plan:    Bipolar affective disorder, currently depressed, mild (HCC)  Generalized anxiety disorder  Tardive dyskinesia  Panic disorder with agoraphobia  Insomnia due to mental condition   Greater than 50% of face to face time  with patient was spent on counseling and coordination of care. We discussed Ms. Torrealba is a very complicated and difficult to treat bipolar patient with tardive dyskinesia.  It is complicated by the presence as well of mild cognitive impairment.  In addition complicated by multiple med failures and very limited options for mood stabilization.  The option that we are using still has a risk of drug drug interactions with the medication for tardive dyskinesia.  Overall she is dramatically more anxious without a clear reason unless due to reduction by mistake of oxcarbazepine.    Her tardive dyskinesia symptoms are  improved after the switch from [name] 120 mg daily to Austedo 12 mg twice daily.  She is also less fidgety and has less tremor than last visit  .Marland Kitchen The mouth movements and tongue movements are improved but not resolved.  She does still have some balance problems which  is partly related to deconditioning from laying in bed too much.  oxcarbazepine to 1 in the morning and 2 at night of the 300 mg tablets. Anxiety is variable. lorazepam 0.5 mg QID  12/29/19 AIMS: 25 (entered into screening today) Try to increase Austedo to 18 mg daily bc sig residual TD. (15 mg  BID could be considered but is likely to be impractical.)  Samples given. Disc risk increased EPS. Call if get too stiff.  Spending too much time in bed.  Needs to stay out of bed more.  Inactivity is causing the weakness.  Needs more exercise and rec walking.  Starting therapy for her arm.  If CO more anxious and depression,  consider lamotrigine bc hesitant to increase paroxetine DT risk mood cycling.   We discussed the short-term risks associated with benzodiazepines including sedation and increased fall risk among others.  Discussed long-term side effect risk including dependence, potential withdrawal symptoms, and the potential eventual dose-related risk of dementia. OK continue to Ativan 0.5 to 1 mg HS per her request.    Consider Caplyta, lamotrigine for mood.  Continue carbidopa-levodopa 2 tablets 2 times daily. She feels that this is helped reduce her movement disorder and tremor issues.  FU 3-4 weeks  Lynder Parents, MD, DFAPA  Please see After Visit Summary for patient specific instructions.  Future Appointments  Date Time Provider [place]  12/20/2020 11:00 AM Penumalli, Earlean Polka, MD [facility] None    No orders of the defined types were placed in this encounter.     -------------------------------

## 2019-12-30 ENCOUNTER — Telehealth: Payer: Self-pay | Admitting: Internal Medicine

## 2019-12-30 NOTE — Telephone Encounter (Signed)
[phone]  PLEASE CALL PATIENT, SHE WAS CLEANING OUT OLD MEDICATIONS AND THINKS SHE MISPLACED HER VIBERZI.  SHE TRIED TO GET A REFILL BUT THE PHARMACY WILL NOT FILL IT BECAUSE IT IS NOT DUE     PLEASE LET HER KNOW WHAT SHE CAN DO

## 2019-12-30 NOTE — Telephone Encounter (Signed)
Left a detailed message for pt. Pt will have to pay for the medication out of pocket for the medication since it's not time to get her medication. Insurance won't pay for the medication before time.

## 2020-01-07 ENCOUNTER — Other Ambulatory Visit: Payer: Self-pay | Admitting: Psychiatry

## 2020-01-10 ENCOUNTER — Other Ambulatory Visit: Payer: Self-pay | Admitting: Nurse Practitioner

## 2020-01-10 DIAGNOSIS — R197 Diarrhea, unspecified: Secondary | ICD-10-CM

## 2020-01-11 DIAGNOSIS — G20 Parkinson's disease: Secondary | ICD-10-CM | POA: Diagnosis not present

## 2020-01-11 DIAGNOSIS — F419 Anxiety disorder, unspecified: Secondary | ICD-10-CM | POA: Diagnosis not present

## 2020-01-11 DIAGNOSIS — M1712 Unilateral primary osteoarthritis, left knee: Secondary | ICD-10-CM | POA: Diagnosis not present

## 2020-01-11 DIAGNOSIS — E039 Hypothyroidism, unspecified: Secondary | ICD-10-CM | POA: Diagnosis not present

## 2020-01-11 DIAGNOSIS — F319 Bipolar disorder, unspecified: Secondary | ICD-10-CM | POA: Diagnosis not present

## 2020-01-12 ENCOUNTER — Telehealth: Payer: Self-pay | Admitting: *Deleted

## 2020-01-12 ENCOUNTER — Telehealth: Payer: Self-pay | Admitting: Internal Medicine

## 2020-01-12 NOTE — Telephone Encounter (Signed)
Pt said she was about out of her Viberzi and was asking for a 90 day supply to be called into [facility] in [place].

## 2020-01-12 NOTE — Telephone Encounter (Signed)
PLEASE CALL PATIENT BACK, SHE RETURNED CALL

## 2020-01-12 NOTE — Telephone Encounter (Signed)
Called pt back and informed her that a 90 day supply was issued 10/2019 with 1 refill.  Pt said that she would call her pharmacy and if any trouble, that she would call me back.

## 2020-01-12 NOTE — Telephone Encounter (Signed)
See other note.  Routed to [facility] RX pool.

## 2020-01-12 NOTE — Telephone Encounter (Signed)
Pt called back and said that [facility] needs new RX for Viberzi 90 day supply.  Pt says that she only has one pill left.

## 2020-01-13 ENCOUNTER — Other Ambulatory Visit: Payer: Self-pay | Admitting: Gastroenterology

## 2020-01-13 NOTE — Telephone Encounter (Signed)
Noted  

## 2020-01-13 NOTE — Telephone Encounter (Signed)
Called [facility] and rep said that they never received RX from 10/23/19 for 180 tablets with 1 refill that [facility] sent.  He took a verbal from me.  Called pt and informed her that we discussed it with [facility] and she should be able to get it now.  Pt voiced understanding.

## 2020-01-13 NOTE — Telephone Encounter (Signed)
Can you call patients pharmacy and see what they trouble is with her current Rx?

## 2020-01-14 DIAGNOSIS — M23212 Derangement of anterior horn of medial meniscus due to old tear or injury, left knee: Secondary | ICD-10-CM | POA: Diagnosis not present

## 2020-01-14 DIAGNOSIS — M2342 Loose body in knee, left knee: Secondary | ICD-10-CM | POA: Diagnosis not present

## 2020-01-19 ENCOUNTER — Telehealth: Payer: Self-pay | Admitting: Psychiatry

## 2020-01-19 ENCOUNTER — Other Ambulatory Visit: Payer: Self-pay

## 2020-01-19 ENCOUNTER — Other Ambulatory Visit: Payer: Self-pay | Admitting: Psychiatry

## 2020-01-19 DIAGNOSIS — G2401 Drug induced subacute dyskinesia: Secondary | ICD-10-CM

## 2020-01-19 DIAGNOSIS — Z01818 Encounter for other preprocedural examination: Secondary | ICD-10-CM | POA: Diagnosis not present

## 2020-01-19 MED ORDER — AUSTEDO 12 MG PO TABS: 1.0000 | ORAL_TABLET | Freq: Two times a day (BID) | ORAL | 2 refills | Status: DC

## 2020-01-19 MED ORDER — AUSTEDO 6 MG PO TABS: 6.0000 mg | ORAL_TABLET | Freq: Two times a day (BID) | ORAL | 5 refills | Status: DC

## 2020-01-19 NOTE — Telephone Encounter (Signed)
RX sent for Austedo 6 mg BID to add to 12 mg BID

## 2020-01-19 NOTE — Telephone Encounter (Signed)
Annette Johnston called to request refill of her Austedo.  She is taking 18mg  and doing well at this dose.  She is about out of the 12mg  and 6mg  samples she has.  Please send in to the speciality pharmacy a refill for 18mg  so she get her medication before she runs out of what she has.

## 2020-01-20 NOTE — Telephone Encounter (Signed)
Yes  keep appt so I can see the difference in TD with med increase

## 2020-01-21 DIAGNOSIS — M1712 Unilateral primary osteoarthritis, left knee: Secondary | ICD-10-CM | POA: Diagnosis not present

## 2020-01-21 DIAGNOSIS — S83242A Other tear of medial meniscus, current injury, left knee, initial encounter: Secondary | ICD-10-CM | POA: Diagnosis not present

## 2020-01-21 DIAGNOSIS — M23222 Derangement of posterior horn of medial meniscus due to old tear or injury, left knee: Secondary | ICD-10-CM | POA: Diagnosis not present

## 2020-01-21 DIAGNOSIS — M2342 Loose body in knee, left knee: Secondary | ICD-10-CM | POA: Diagnosis not present

## 2020-01-21 DIAGNOSIS — G20 Parkinson's disease: Secondary | ICD-10-CM | POA: Diagnosis not present

## 2020-01-21 DIAGNOSIS — E039 Hypothyroidism, unspecified: Secondary | ICD-10-CM | POA: Diagnosis not present

## 2020-01-21 DIAGNOSIS — S83282A Other tear of lateral meniscus, current injury, left knee, initial encounter: Secondary | ICD-10-CM | POA: Diagnosis not present

## 2020-01-21 NOTE — Telephone Encounter (Signed)
LM to keep apt as scheduled and her Rx's were sent to her speciality pharmacy

## 2020-02-02 ENCOUNTER — Other Ambulatory Visit: Payer: Self-pay

## 2020-02-02 ENCOUNTER — Encounter: Payer: Self-pay | Admitting: Psychiatry

## 2020-02-02 ENCOUNTER — Ambulatory Visit (INDEPENDENT_AMBULATORY_CARE_PROVIDER_SITE_OTHER): Payer: Medicare Other | Admitting: Psychiatry

## 2020-02-02 DIAGNOSIS — F411 Generalized anxiety disorder: Secondary | ICD-10-CM

## 2020-02-02 DIAGNOSIS — G2401 Drug induced subacute dyskinesia: Secondary | ICD-10-CM

## 2020-02-02 DIAGNOSIS — G3184 Mild cognitive impairment, so stated: Secondary | ICD-10-CM

## 2020-02-02 DIAGNOSIS — F5105 Insomnia due to other mental disorder: Secondary | ICD-10-CM

## 2020-02-02 DIAGNOSIS — F3131 Bipolar disorder, current episode depressed, mild: Secondary | ICD-10-CM

## 2020-02-02 DIAGNOSIS — F4001 Agoraphobia with panic disorder: Secondary | ICD-10-CM | POA: Diagnosis not present

## 2020-02-02 NOTE — Progress Notes (Signed)
Annette Johnston [id number] 06/01/1946 74 y.o.    Subjective:   Patient ID:  Annette Johnston is a 74 y.o. (DOB 08-24-1945) female.  Chief Complaint:  Chief Complaint  Patient presents with  . Follow-up    mood  . Anxiety  . tardive dyskinesia    recent med changes    Anxiety Symptoms include decreased concentration. Patient reports no confusion, dizziness, nausea, nervous/anxious behavior or suicidal ideas.      Annette Johnston presents to the office today for follow-up of recent worsening TD after stopping [name]  When seen October 23, 2018.  No med changes were made as of that date.  She had just gotten back on the [name] 120 mg a day which was helpful for her tardive dyskinesia symptoms.  At visit Dec 23, 2018.  No meds were changed.  She was spending too much time in bed and she was encouraged to get out of bed.  When seen April 27, 2019.  Because of racing thoughts oxcarbazepine was increased to 300 mg in the morning and 600 mg at night.  It helped. She was having significant residual tardive dyskinesia symptoms on [name] at a high dosage but she did not want to change medicines at that time.  However since that time she called and wanted to make the change to Austedo.  There have been multiple phone calls since September 14.  She called October 6 complaining that her jaw was locking up.  A trial of benztropine 0.5 mg twice daily was ineffective.  She called October 12 stating the tardive dyskinesia symptoms were worse.  There was some concern that perhaps Trileptal was inducing metabolism of [name].  We started the switch from [name] to [place].  She was given lorazepam 0.5 mg 4 times daily for anxiety.  As of October 21 she had increased Austedo to 12 mg twice daily and felt it was helpful.  When seen June 16, 2019 she was complaining of nausea.  Zofran was not effective.  She was given Phenergan.  There was a question as to whether it was related to Austedo.   She was also encouraged to stop meloxicam.  seen July 08, 2019.  The following changes were made: Increase carbidopa-levodopa to 2 in the morning and 1 in the afternoon and 1 in the evening for 4 days. If tolerated but not helpful enough for balance then increase to 2 tablets 3 times daily. For anxiety add Ativan 1 tablet 3 times daily.  Doing better and less agitated with Ativan.  A lot of it was nerves.  Also less movement disorder with change in carbidopa-levodopa to 2 tablets 3 times daily.  Better balance.  Not in bed as much in the last 2 weeks.  I feel a big difference with the Ativan.  Less anxiety and panic.  seen July 29, 2019.  No meds were changed.  Overall she was satisfied with the meds at that time.  seen September 29, 2019.  She was having some racing thoughts and oxcarbazepine 300 mg was increased to 1 in the morning and 2 at night.  She continued paroxetine 30 mg daily, lorazepam 0.5 mg 3 times daily to 4 times daily, Austedo 12 mg twice daily, Sinemet 25-100 mg 2 tablets twice daily.  She called April 14 complaining of severe panic and was given extra lorazepam.  She claims compliance with medications.  She was scheduled for an urgent appointment the following day.    As of November 26, 2019 she reports the following. Increased anxiety and panic.  Felt SOB, dizzy and called ER bc choking.  Another one yesterday.  ER workup was OK.  Given 1 mg Ativan to ER.  That calmed me down.  Still SOB and constantly grunting and tongue twirling.  Mild slurring.  Came on suddenly on Tuesday.  Called neuro but couldn't get appt until May. Balance is poor. Plenty of sleep. Not grunting at night. She is not having manic symptoms  Note she's been taking 1 of 30 mg paroxetine daily not 15 mg as indicated previously in the chart. Plan: For  anxiety increase oxcarbazepine to 1 in the morning and 2 at night of the 300 mg tablets. Anxiety is still bad.   Increase lorazepam 0.5 mg QID Continue  Austedo 12 mg tablets, 1 twice daily.  Continue carbidopa levodopa 25-100 mg 2 tablets twice daily. Continue paroxetine  30 mg daily for anxiety.  12/29/2019 appointment, the following is noted: Started grunting when gets agitated.  Fidgety. Had panic after accidentally stopping Austedo for a week.  Better anxiety back on her meds.  Worsening tongue movements and sucking lip.   Nausea resolved when split up meds.  Upset hasn't seen daughter in 3 years and hasn't called last 2 mother's days.   No mania and depression is manageable.  Sleep is fine. Plan increase Austedo to 12 mg twice daily. Continue oxcarbazepine 300 mg in the morning and 600 mg nightly and lorazepam 0.5 mg 4 times daily and Sinemet 25-102 tablets twice daily, Continue paroxetine  30 mg daily for anxiety.  02/02/2020 appointment with with the following noted: Knee surgery and done OK with it so far.  StartingPT. Doing really well with increase Austedo 18 BID.  Less slinging tongue and arms.  Less sucking lips.  Tolerated Austedo without a problem.   Mood is pretty good.  Still lay around a lot.   Thinks walking will get better gradually.   Anxiety is pretty good. Still fidgety but not that bothersome.    Not usually angry..   Tolerating all meds.    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  Past Psychiatric Medication Trials: Depakote jerks, lithium tremor, risperidone side effects, Seroquel 300, remote history of ECT, Latuda, [name], paroxetine 30 for panic, Equetro good response, oxcarbazepine 600 partial response, Saphris, lorazepam, sertraline, [name] partial response Austedo 12 BID Lorazepam 0.5 3 times daily with good response  Review of Systems:  Review of Systems  Constitutional: Positive for fatigue.  Gastrointestinal: Negative for nausea.  Musculoskeletal: Positive for arthralgias and joint swelling. Negative for back pain.  Neurological: Positive for tremors and weakness.  Negative for dizziness.       Poor balance  Psychiatric/Behavioral: Positive for decreased concentration. Negative for agitation, behavioral problems, confusion, dysphoric mood, hallucinations, self-injury, sleep disturbance and suicidal ideas. The patient is not nervous/anxious and is not hyperactive.     Medications: I have reviewed the patient's current medications.  Current Outpatient Medications  Medication Sig Dispense Refill  . carbidopa-levodopa (SINEMET IR) 25-100 MG tablet Take 2 tablets by mouth 2 (two) times daily. 120 tablet 12  . Deutetrabenazine (AUSTEDO) 12 MG TABS Take 1 tablet by mouth 2 (two) times daily. 60 tablet 2  . Deutetrabenazine (AUSTEDO) 6 MG TABS Take 6 mg by mouth in the morning and at bedtime. 60 tablet 5  . diphenhydramine-acetaminophen (TYLENOL PM) 25-500 MG TABS tablet Take 2 tablets by mouth at bedtime as  needed.     . Eluxadoline (VIBERZI) 75 MG TABS Take 1 tablet by mouth 2 (two) times daily with a meal. 180 tablet 1  . levothyroxine (SYNTHROID, LEVOTHROID) 137 MCG tablet Take 137 mcg by mouth daily before breakfast.    . LORazepam (ATIVAN) 0.5 MG tablet Take 1 tablet (0.5 mg total) by mouth in the morning, at noon, in the evening, and at bedtime. 120 tablet 0  . Oxcarbazepine (TRILEPTAL) 300 MG tablet TAKE 1 TABLET BY MOUTH EVERY MORNING AND 2 TABLETS AT NIGHT 270 tablet 0  . PARoxetine (PAXIL) 30 MG tablet Take 1 tablet (30 mg total) by mouth daily. 90 tablet 1  . promethazine (PHENERGAN) 25 MG tablet Take 1 tablet (25 mg total) by mouth every 8 (eight) hours as needed for nausea or vomiting. 30 tablet 6   No current facility-administered medications for this visit.    Medication Side Effects: None  Allergies:  Allergies  Allergen Reactions  . Latex Rash    Bandaids    Past Medical History:  Diagnosis Date  . Anxiety   . Bipolar 1 disorder ([place])   . Depression   . Dyspnea on exertion july 2011   Normal stress test dec 2010. Reportedl normal  CXR 2011. CT abdomen lung cut 2005  - normal. Normal PFT - march 2012  . Hypothyroidism   . Obesity (BMI 30-39.9)    BMI 36 Feb 2012  . Urticaria     Family History  Problem Relation Age of Onset  . Alzheimer's disease Mother   . Heart attack Father   . Coronary artery disease Brother   . Heart attack Brother   . Alzheimer's disease Paternal Grandmother   . Allergic rhinitis Neg Hx   . Angioedema Neg Hx   . Asthma Neg Hx   . Atopy Neg Hx   . Eczema Neg Hx   . Immunodeficiency Neg Hx   . Urticaria Neg Hx   . Colon cancer Neg Hx     Social History   Socioeconomic History  . Marital status: Married    Spouse name: Annette Johnston  . Number of children: 1  . Years of education: Not on file  . Highest education level: High school graduate  Occupational History  . Occupation: retired  Tobacco Use  . Smoking status: Never Smoker  . Smokeless tobacco: Never Used  Vaping Use  . Vaping Use: Never used  Substance and Sexual Activity  . Alcohol use: Not Currently  . Drug use: No  . Sexual activity: Yes    Birth control/protection: Surgical  Other Topics Concern  . Not on file  Social History Narrative   Caffeine 1 cup per day    Lives at home with husband    Right Handed.    Social Determinants of Health   Financial Resource Strain:   . Difficulty of Paying Living Expenses:   Food Insecurity:   . Worried About Charity fundraiser in the Last Year:   . Arboriculturist in the Last Year:   Transportation Needs:   . Film/video editor (Medical):   Marland Kitchen Lack of Transportation (Non-Medical):   Physical Activity:   . Days of Exercise per Week:   . Minutes of Exercise per Session:   Stress:   . Feeling of Stress :   Social Connections:   . Frequency of Communication with Friends and Family:   . Frequency of Social Gatherings with Friends and Family:   . Attends  Religious Services:   . Active Member of Clubs or Organizations:   . Attends Archivist Meetings:   Marland Kitchen  Marital Status:   Intimate Partner Violence:   . Fear of Current or Ex-Partner:   . Emotionally Abused:   Marland Kitchen Physically Abused:   . Sexually Abused:     Past Medical History, Surgical history, Social history, and Family history were reviewed and updated as appropriate.   Please see review of systems for further details on the patient's review from today.   Objective:   Physical Exam:  There were no vitals taken for this visit.  Physical Exam Neurological:     Mental Status: She is alert and oriented to person, place, and time.     Cranial Nerves: No dysarthria.     Motor: Tremor present.     Gait: Gait abnormal.     Comments: Still licks lips and puckers but it is better with meds.  More fidgety foot movements. Some grunting. Tremor moderate positional Stiff gait with reasonable pace.  Psychiatric:        Attention and Perception: Attention and perception normal.        Mood and Affect: Mood is anxious. Mood is not depressed.        Speech: Speech normal.        Behavior: Behavior is cooperative.        Thought Content: Thought content normal. Thought content is not paranoid or delusional. Thought content does not include homicidal or suicidal ideation. Thought content does not include homicidal or suicidal plan.        Cognition and Memory: Cognition and memory normal.        Judgment: Judgment normal.     Comments: Insight and judgment are fair.  Still significant tendency to worry.  She is less depressed than at times. No mania. Still fidgety     Lab Review:     Component Value Date/Time   NA 137 12/17/2017 1056   K 4.8 12/17/2017 1056   CL 98 12/17/2017 1056   CO2 23 12/17/2017 1056   GLUCOSE 89 12/17/2017 1056   GLUCOSE 87 06/06/2016 0959   BUN 15 12/17/2017 1056   CREATININE 0.64 12/17/2017 1056   CALCIUM 9.4 12/17/2017 1056   PROT 7.3 12/17/2017 1056   ALBUMIN 4.2 12/17/2017 1056   AST 30 12/17/2017 1056   ALT 26 12/17/2017 1056   ALKPHOS 77 12/17/2017  1056   BILITOT <0.2 12/17/2017 1056   GFRNONAA 90 12/17/2017 1056   GFRAA 104 12/17/2017 1056       Component Value Date/Time   WBC 6.7 12/17/2017 1056   WBC 6.1 06/06/2016 0959   RBC 4.27 12/17/2017 1056   RBC 4.64 06/06/2016 0959   HGB 13.1 12/17/2017 1056   HCT 38.2 12/17/2017 1056   PLT 329 12/17/2017 1056   MCV 90 12/17/2017 1056   MCH 30.7 12/17/2017 1056   MCH 30.0 06/06/2016 0959   MCHC 34.3 12/17/2017 1056   MCHC 33.7 06/06/2016 0959   RDW 14.0 12/17/2017 1056   LYMPHSABS 1.6 12/17/2017 1056   MONOABS 0.5 06/06/2016 0959   EOSABS 0.3 12/17/2017 1056   BASOSABS 0.0 12/17/2017 1056    No results found for: POCLITH, LITHIUM   No results found for: PHENYTOIN, PHENOBARB, VALPROATE, CBMZ   .res Assessment: Plan:    Bipolar affective disorder, currently depressed, mild ([place])  Tardive dyskinesia  Generalized anxiety disorder  Panic disorder with agoraphobia  Insomnia due to mental condition  Mild cognitive impairment   Greater than 50% of face to face time with patient was spent on counseling and coordination of care. We discussed Ms. Selders is a very complicated and difficult to treat bipolar patient with tardive dyskinesia.  It is complicated by the presence as well of mild cognitive impairment.  In addition complicated by multiple med failures and very limited options for mood stabilization.  The option that we are using still has a risk of drug drug interactions with the medication for tardive dyskinesia.  Overall she is dramatically more anxious without a clear reason unless due to reduction by mistake of oxcarbazepine.    Her tardive dyskinesia symptoms are  improved after the switch from [name] 120 mg daily to Austedo 12 mg twice daily.  She is also less fidgety and has less tremor than last visit  .Marland Kitchen The mouth movements and tongue movements are improved but not resolved.  She does still have some balance problems which is partly related to deconditioning  from laying in bed too much.  oxcarbazepine to 1 in the morning and 2 at night of the 300 mg tablets. Anxiety is variable. lorazepam 0.5 mg QID and continue paroxetine 30 mg daily for anxity.  12/29/19 AIMS: 25 (entered into screening today) Continue Austedo to 18 mg daily bc sig residual TD. She tolerated it and it helped tongue facial TD. Disc risk increased EPS.  Spending too much time in bed.  Needs to stay out of bed more.  Inactivity is causing the weakness.  Needs more exercise and rec walking.  Starting therapy for her arm and knee..  If CO more anxious and depression,  consider lamotrigine bc hesitant to increase paroxetine DT risk mood cycling.   We discussed the short-term risks associated with benzodiazepines including sedation and increased fall risk among others.  Discussed long-term side effect risk including dependence, potential withdrawal symptoms, and the potential eventual dose-related risk of dementia. OK continue to Ativan 0.5 to 1 mg HS per her request.    Consider Caplyta, lamotrigine for mood.  Continue carbidopa-levodopa 2 tablets 2 times daily. She feels that this is helped reduce her movement disorder and tremor issues.  FU 3-4 weeks  Lynder Parents, MD, DFAPA  Please see After Visit Summary for patient specific instructions.  Future Appointments  Date Time Provider [place]  12/20/2020 11:00 AM Penumalli, Earlean Polka, MD [facility] None    No orders of the defined types were placed in this encounter.     -------------------------------

## 2020-02-18 ENCOUNTER — Other Ambulatory Visit: Payer: Self-pay | Admitting: Diagnostic Neuroimaging

## 2020-02-23 ENCOUNTER — Other Ambulatory Visit: Payer: Self-pay | Admitting: Psychiatry

## 2020-02-23 DIAGNOSIS — F5105 Insomnia due to other mental disorder: Secondary | ICD-10-CM

## 2020-03-10 DIAGNOSIS — R300 Dysuria: Secondary | ICD-10-CM | POA: Diagnosis not present

## 2020-03-10 DIAGNOSIS — N3000 Acute cystitis without hematuria: Secondary | ICD-10-CM | POA: Diagnosis not present

## 2020-03-10 DIAGNOSIS — Z6826 Body mass index (BMI) 26.0-26.9, adult: Secondary | ICD-10-CM | POA: Diagnosis not present

## 2020-03-28 ENCOUNTER — Other Ambulatory Visit: Payer: Self-pay | Admitting: Psychiatry

## 2020-04-08 DIAGNOSIS — Z20828 Contact with and (suspected) exposure to other viral communicable diseases: Secondary | ICD-10-CM | POA: Diagnosis not present

## 2020-04-08 DIAGNOSIS — R0600 Dyspnea, unspecified: Secondary | ICD-10-CM | POA: Diagnosis not present

## 2020-04-09 DIAGNOSIS — R0600 Dyspnea, unspecified: Secondary | ICD-10-CM | POA: Diagnosis not present

## 2020-05-03 ENCOUNTER — Encounter: Payer: Medicare Other | Admitting: Psychiatry

## 2020-05-03 NOTE — Progress Notes (Signed)
This encounter was created in error - please disregard.

## 2020-05-11 DIAGNOSIS — I517 Cardiomegaly: Secondary | ICD-10-CM | POA: Diagnosis not present

## 2020-05-11 DIAGNOSIS — R0602 Shortness of breath: Secondary | ICD-10-CM | POA: Diagnosis not present

## 2020-05-11 DIAGNOSIS — I5189 Other ill-defined heart diseases: Secondary | ICD-10-CM | POA: Diagnosis not present

## 2020-05-12 ENCOUNTER — Other Ambulatory Visit: Payer: Self-pay | Admitting: Psychiatry

## 2020-05-12 DIAGNOSIS — F319 Bipolar disorder, unspecified: Secondary | ICD-10-CM

## 2020-05-12 DIAGNOSIS — F411 Generalized anxiety disorder: Secondary | ICD-10-CM

## 2020-05-12 DIAGNOSIS — F4001 Agoraphobia with panic disorder: Secondary | ICD-10-CM

## 2020-05-13 ENCOUNTER — Telehealth: Payer: Self-pay | Admitting: Psychiatry

## 2020-05-13 ENCOUNTER — Other Ambulatory Visit: Payer: Self-pay

## 2020-05-13 DIAGNOSIS — F5105 Insomnia due to other mental disorder: Secondary | ICD-10-CM

## 2020-05-13 MED ORDER — LORAZEPAM 0.5 MG PO TABS: 0.5000 mg | ORAL_TABLET | Freq: Four times a day (QID) | ORAL | 2 refills | Status: DC

## 2020-05-13 NOTE — Telephone Encounter (Signed)
No faxed was received but will [facility] per request

## 2020-05-13 NOTE — Telephone Encounter (Signed)
Annette Johnston has called 3 times today to find out if her [facility]ep[facility] has been [facility]. [facility]

## 2020-06-20 ENCOUNTER — Other Ambulatory Visit: Payer: Self-pay | Admitting: Physician Assistant

## 2020-06-20 DIAGNOSIS — F4001 Agoraphobia with panic disorder: Secondary | ICD-10-CM

## 2020-06-20 DIAGNOSIS — F411 Generalized anxiety disorder: Secondary | ICD-10-CM

## 2020-06-20 DIAGNOSIS — F319 Bipolar disorder, unspecified: Secondary | ICD-10-CM

## 2020-06-27 ENCOUNTER — Other Ambulatory Visit: Payer: Self-pay | Admitting: Psychiatry

## 2020-06-27 DIAGNOSIS — G2401 Drug induced subacute dyskinesia: Secondary | ICD-10-CM

## 2020-07-05 ENCOUNTER — Telehealth: Payer: Self-pay | Admitting: Internal Medicine

## 2020-07-05 ENCOUNTER — Other Ambulatory Visit: Payer: Self-pay | Admitting: Nurse Practitioner

## 2020-07-05 DIAGNOSIS — R197 Diarrhea, unspecified: Secondary | ICD-10-CM

## 2020-07-05 NOTE — Telephone Encounter (Signed)
noted 

## 2020-07-05 NOTE — Telephone Encounter (Signed)
Patient needs [facility] [place]

## 2020-07-05 NOTE — Telephone Encounter (Signed)
Routing to [facility] Refill box. Viberzi 75 mg one tab bid

## 2020-07-06 DIAGNOSIS — J069 Acute upper respiratory infection, unspecified: Secondary | ICD-10-CM | POA: Diagnosis not present

## 2020-07-06 DIAGNOSIS — J40 Bronchitis, not specified as acute or chronic: Secondary | ICD-10-CM | POA: Diagnosis not present

## 2020-07-06 DIAGNOSIS — Z20828 Contact with and (suspected) exposure to other viral communicable diseases: Secondary | ICD-10-CM | POA: Diagnosis not present

## 2020-07-11 ENCOUNTER — Other Ambulatory Visit: Payer: Self-pay | Admitting: Nurse Practitioner

## 2020-07-11 DIAGNOSIS — R197 Diarrhea, unspecified: Secondary | ICD-10-CM

## 2020-07-12 ENCOUNTER — Telehealth: Payer: Self-pay | Admitting: Internal Medicine

## 2020-07-12 NOTE — Telephone Encounter (Signed)
Spoke with pt. Pt is taking Viberzi 75 mg and [facility] [place] [facility]. They have 30 pills of Viberzi 100 mg. Pt is asking if she can have a prescription for Viberzi 100 mg 30 day supply sent into [facility] [place]. Pt states she is out of viberzi and she goes to the bathroom about 11 times without the medication. Please advise.

## 2020-07-12 NOTE — Telephone Encounter (Signed)
[phone] please call patient about her meds    She can not get the pharmacy to fill them and it is urgent she stated

## 2020-07-13 ENCOUNTER — Telehealth: Payer: Self-pay | Admitting: Internal Medicine

## 2020-07-13 NOTE — Telephone Encounter (Signed)
Her last BMP was a while back, but Cr./kidney function was normal. I'm not necessarily opposed to 100 mg, though I've never seen her. I'm also a little hesitant to change her dose just due to availability when I don't know her.  The best option is to see how long it'll take them to order/receive the 75 mg pills. Alternatively, she can ask [facility] to [facility] for [facility] that [facility] it in [facility] and get it from there.  Let me know if either of these is possible. Will also ask Vicente Males for any input as she last saw her (aka- ok to do 100 mg for 30 days?)

## 2020-07-13 NOTE — Telephone Encounter (Signed)
Spoke with pt. Pts pharmacy won't allow pt to have her medication picked up at another location due to it being a controlled substance. Pt wants Vicente Males to know that she is still have 5 bowel movements on the 75 mg of viberzi. Per pt she is afraid she's going to have to go back to diapers if the medication isn't called in.

## 2020-07-13 NOTE — Telephone Encounter (Signed)
Pt called this morning saying she had called yesterday about getting a refill on Viberzi. She is anxious about getting her medication and wants to get them ASAP.

## 2020-07-13 NOTE — Telephone Encounter (Signed)
Spoke with pt. Pt is aware that a message was sent to the doctor and she will be notified when medication is sent to her pharmacy. Roseanne Kaufman, NP forwarded message to [facility] refill box.

## 2020-07-14 MED ORDER — VIBERZI 100 MG PO TABS: 1.0000 | ORAL_TABLET | Freq: Two times a day (BID) | ORAL | 5 refills | Status: DC

## 2020-07-14 NOTE — Telephone Encounter (Signed)
We can try 100 mg BID. Start off with just once daily. Can increase to twice a day if tolerated. I have printed it to be faxed to pharmacy. I am unable to send it electronically as finger scan not working.

## 2020-07-14 NOTE — Telephone Encounter (Signed)
Spoke with pt. Pt was notified that RX was faxed to her pharmacy and instructions were given for taking the medication.

## 2020-07-14 NOTE — Addendum Note (Signed)
Addended by: Annitta Needs on: 07/14/2020 11:59 AM   Modules accepted: Orders

## 2020-07-25 DIAGNOSIS — Z23 Encounter for immunization: Secondary | ICD-10-CM | POA: Diagnosis not present

## 2020-09-02 ENCOUNTER — Other Ambulatory Visit: Payer: Self-pay | Admitting: Psychiatry

## 2020-09-02 DIAGNOSIS — F5105 Insomnia due to other mental disorder: Secondary | ICD-10-CM

## 2020-09-02 DIAGNOSIS — F4001 Agoraphobia with panic disorder: Secondary | ICD-10-CM

## 2020-09-02 DIAGNOSIS — F411 Generalized anxiety disorder: Secondary | ICD-10-CM

## 2020-09-02 DIAGNOSIS — F319 Bipolar disorder, unspecified: Secondary | ICD-10-CM

## 2020-09-06 ENCOUNTER — Other Ambulatory Visit: Payer: Self-pay | Admitting: Psychiatry

## 2020-09-06 ENCOUNTER — Telehealth: Payer: Self-pay | Admitting: Internal Medicine

## 2020-09-06 NOTE — Telephone Encounter (Signed)
Pt said her prescription of [name] has gone up and she can't afford it. She is asking for something else similar that isn't as expensive and wants it [facility] [place]. Please advise. [phone]

## 2020-09-07 ENCOUNTER — Ambulatory Visit (INDEPENDENT_AMBULATORY_CARE_PROVIDER_SITE_OTHER): Payer: Medicare HMO | Admitting: Psychiatry

## 2020-09-07 ENCOUNTER — Encounter: Payer: Self-pay | Admitting: Psychiatry

## 2020-09-07 ENCOUNTER — Other Ambulatory Visit: Payer: Self-pay

## 2020-09-07 DIAGNOSIS — F319 Bipolar disorder, unspecified: Secondary | ICD-10-CM | POA: Diagnosis not present

## 2020-09-07 DIAGNOSIS — F4001 Agoraphobia with panic disorder: Secondary | ICD-10-CM | POA: Diagnosis not present

## 2020-09-07 DIAGNOSIS — F411 Generalized anxiety disorder: Secondary | ICD-10-CM | POA: Diagnosis not present

## 2020-09-07 DIAGNOSIS — G2401 Drug induced subacute dyskinesia: Secondary | ICD-10-CM

## 2020-09-07 MED ORDER — AUSTEDO 9 MG PO TABS: 18.0000 mg | ORAL_TABLET | Freq: Two times a day (BID) | ORAL | 1 refills | Status: DC

## 2020-09-07 MED ORDER — PAROXETINE HCL 30 MG PO TABS: 30.0000 mg | ORAL_TABLET | Freq: Every day | ORAL | 1 refills | Status: DC

## 2020-09-07 MED ORDER — OXCARBAZEPINE 300 MG PO TABS: ORAL_TABLET | ORAL | 1 refills | Status: DC

## 2020-09-07 NOTE — Progress Notes (Signed)
Annette Johnston [id number] August 14, 1945 75 y.o.    Subjective:   Patient ID:  Annette Johnston is a 75 y.o. (DOB [birth year]) female.  Chief Complaint:  Chief Complaint  Patient presents with  . Follow-up  . Medication Problem    Cost concerns  . Other    TD  . Anxiety    Anxiety Symptoms include decreased concentration. Patient reports no confusion, dizziness, nausea, nervous/anxious behavior or suicidal ideas.      Annette Johnston presents to the office today for follow-up of recent worsening TD after stopping [name]  When seen October 23, 2018.  No med changes were made as of that date.  She had just gotten back on the [name] 120 mg a day which was helpful for her tardive dyskinesia symptoms.  At visit Dec 23, 2018.  No meds were changed.  She was spending too much time in bed and she was encouraged to get out of bed.  When seen April 27, 2019.  Because of racing thoughts oxcarbazepine was increased to 300 mg in the morning and 600 mg at night.  It helped. She was having significant residual tardive dyskinesia symptoms on [name] at a high dosage but she did not want to change medicines at that time.  However since that time she called and wanted to make the change to Austedo.  There have been multiple phone calls since September 14.  She called October 6 complaining that her jaw was locking up.  A trial of benztropine 0.5 mg twice daily was ineffective.  She called October 12 stating the tardive dyskinesia symptoms were worse.  There was some concern that perhaps Trileptal was inducing metabolism of [name].  We started the switch from [name] to [place].  She was given lorazepam 0.5 mg 4 times daily for anxiety.  As of October 21 she had increased Austedo to 12 mg twice daily and felt it was helpful.  When seen June 16, 2019 she was complaining of nausea.  Zofran was not effective.  She was given Phenergan.  There was a question as to whether it was related to  Austedo.  She was also encouraged to stop meloxicam.  seen July 08, 2019.  The following changes were made: Increase carbidopa-levodopa to 2 in the morning and 1 in the afternoon and 1 in the evening for 4 days. If tolerated but not helpful enough for balance then increase to 2 tablets 3 times daily. For anxiety add Ativan 1 tablet 3 times daily.  Doing better and less agitated with Ativan.  A lot of it was nerves.  Also less movement disorder with change in carbidopa-levodopa to 2 tablets 3 times daily.  Better balance.  Not in bed as much in the last 2 weeks.  I feel a big difference with the Ativan.  Less anxiety and panic.  seen July 29, 2019.  No meds were changed.  Overall she was satisfied with the meds at that time.  seen September 29, 2019.  She was having some racing thoughts and oxcarbazepine 300 mg was increased to 1 in the morning and 2 at night.  She continued paroxetine 30 mg daily, lorazepam 0.5 mg 3 times daily to 4 times daily, Austedo 12 mg twice daily, Sinemet 25-100 mg 2 tablets twice daily.  She called April 14 complaining of severe panic and was given extra lorazepam.  She claims compliance with medications.  She was scheduled for an urgent appointment the following day.    As  of November 26, 2019 she reports the following. Increased anxiety and panic.  Felt SOB, dizzy and called ER bc choking.  Another one yesterday.  ER workup was OK.  Given 1 mg Ativan to ER.  That calmed me down.  Still SOB and constantly grunting and tongue twirling.  Mild slurring.  Came on suddenly on Tuesday.  Called neuro but couldn't get appt until May. Balance is poor. Plenty of sleep. Not grunting at night. She is not having manic symptoms  Note she's been taking 1 of 30 mg paroxetine daily not 15 mg as indicated previously in the chart. Plan: For  anxiety increase oxcarbazepine to 1 in the morning and 2 at night of the 300 mg tablets. Anxiety is still bad.   Increase lorazepam 0.5 mg  QID Continue Austedo 12 mg tablets, 1 twice daily.  Continue carbidopa levodopa 25-100 mg 2 tablets twice daily. Continue paroxetine  30 mg daily for anxiety.  12/29/2019 appointment, the following is noted: Started grunting when gets agitated.  Fidgety. Had panic after accidentally stopping Austedo for a week.  Better anxiety back on her meds.  Worsening tongue movements and sucking lip.   Nausea resolved when split up meds.  Upset hasn't seen daughter in 3 years and hasn't called last 2 mother's days.   No mania and depression is manageable.  Sleep is fine. Plan increase Austedo to 12 mg twice daily. Continue oxcarbazepine 300 mg in the morning and 600 mg nightly and lorazepam 0.5 mg 4 times daily and Sinemet 25-102 tablets twice daily, Continue paroxetine  30 mg daily for anxiety.  02/02/2020 appointment with with the following noted: Knee surgery and done OK with it so far.  StartingPT. Doing really well with increase Austedo 18 BID.  Less slinging tongue and arms.  Less sucking lips.  Tolerated Austedo without a problem.   Mood is pretty good.  Still lay around a lot.   Thinks walking will get better gradually.   Anxiety is pretty good. Still fidgety but not that bothersome.    Not usually angry..   Tolerating all meds.   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 Plan: No med changes today  09/07/2020 appointment with following noted: Still doesn't want to do anything.  Annette Johnston takes care of her.  Easily agitated.   Worries over looking for another grant for Austedo bc costs $180/month and can't afford that. On Austedo 6+12 mg daily.  Will try changing to 9 mg 2 tablets BID and see if that's covered with 1 copayment which would make this more affordable.   Past Psychiatric Medication Trials: Depakote jerks, lithium tremor, risperidone side effects, Seroquel 300, remote history of ECT, Latuda, [name], paroxetine 30 for panic, Equetro good response,  oxcarbazepine 600 partial response, Saphris, lorazepam, sertraline,  [name] partial response Austedo 18 BID Lorazepam 0.5 3 times daily with good response  Review of Systems:  Review of Systems  Constitutional: Positive for fatigue.  Gastrointestinal: Negative for nausea.  Musculoskeletal: Positive for arthralgias and joint swelling. Negative for back pain.  Neurological: Positive for tremors and weakness. Negative for dizziness.       Poor balance  Psychiatric/Behavioral: Positive for decreased concentration. Negative for agitation, behavioral problems, confusion, dysphoric mood, hallucinations, self-injury, sleep disturbance and suicidal ideas. The patient is not nervous/anxious and is not hyperactive.     Medications: I have reviewed the patient's current medications.  Current Outpatient Medications  Medication Sig Dispense Refill  .  carbidopa-levodopa (SINEMET IR) 25-100 MG tablet Take 2 tablets by mouth 2 (two) times daily. 120 tablet 12  . Deutetrabenazine (AUSTEDO) 9 MG TABS Take 18 mg by mouth in the morning and at bedtime. 120 tablet 1  . diphenhydramine-acetaminophen (TYLENOL PM) 25-500 MG TABS tablet Take 2 tablets by mouth at bedtime as needed.     . Eluxadoline (VIBERZI) 100 MG TABS Take 1 tablet (100 mg total) by mouth 2 (two) times daily with a meal. 60 tablet 5  . levothyroxine (SYNTHROID, LEVOTHROID) 137 MCG tablet Take 137 mcg by mouth daily before breakfast.    . LORazepam (ATIVAN) 0.5 MG tablet TAKE 1 TABLET BY MOUTH EVERY MORNING TAKE 1 TABLET BY MOUTH AT NOON TAKE 1 TABLET BY MOUTH EVERY EVENING AND TAKE 1 TABLET BY MOUTH AT BEDTIME 120 tablet 2  . promethazine (PHENERGAN) 25 MG tablet Take 1 tablet (25 mg total) by mouth every 8 (eight) hours as needed for nausea or vomiting. 30 tablet 6  . VIBERZI 75 MG TABS TAKE 1 TABLET BY MOUTH 2 (TWO) TIMES DAILY WITH A MEAL. 180 tablet 1  . Oxcarbazepine (TRILEPTAL) 300 MG tablet TAKE 1 TABLET BY MOUTH EVERY MORNING AND 2  TABLETS AT NIGHT 270 tablet 1  . PARoxetine (PAXIL) 30 MG tablet Take 1 tablet (30 mg total) by mouth daily. 90 tablet 1   No current facility-administered medications for this visit.    Medication Side Effects: None  Allergies:  Allergies  Allergen Reactions  . Latex Rash    Bandaids    Past Medical History:  Diagnosis Date  . Anxiety   . Bipolar 1 disorder ([place])   . Depression   . Dyspnea on exertion july 2011   Normal stress test dec 2010. Reportedl normal CXR 2011. CT abdomen lung cut 2005  - normal. Normal PFT - march 2012  . Hypothyroidism   . Obesity (BMI 30-39.9)    BMI 36 Feb 2012  . Urticaria     Family History  Problem Relation Age of Onset  . Alzheimer's disease Mother   . Heart attack Father   . Coronary artery disease Brother   . Heart attack Brother   . Alzheimer's disease Paternal Grandmother   . Allergic rhinitis Neg Hx   . Angioedema Neg Hx   . Asthma Neg Hx   . Atopy Neg Hx   . Eczema Neg Hx   . Immunodeficiency Neg Hx   . Urticaria Neg Hx   . Colon cancer Neg Hx     Social History   Socioeconomic History  . Marital status: Married    Spouse name: Annette Johnston  . Number of children: 1  . Years of education: Not on file  . Highest education level: High school graduate  Occupational History  . Occupation: retired  Tobacco Use  . Smoking status: Never Smoker  . Smokeless tobacco: Never Used  Vaping Use  . Vaping Use: Never used  Substance and Sexual Activity  . Alcohol use: Not Currently  . Drug use: No  . Sexual activity: Yes    Birth control/protection: Surgical  Other Topics Concern  . Not on file  Social History Narrative   Caffeine 1 cup per day    Lives at home with husband    Right Handed.    Social Determinants of Health   Financial Resource Strain: Not on file  Food Insecurity: Not on file  Transportation Needs: Not on file  Physical Activity: Not on file  Stress:  Not on file  Social Connections: Not on file  Intimate  Partner Violence: Not on file    Past Medical History, Surgical history, Social history, and Family history were reviewed and updated as appropriate.   Please see review of systems for further details on the patient's review from today.   Objective:   Physical Exam:  There were no vitals taken for this visit.  Physical Exam Neurological:     Mental Status: She is alert and oriented to person, place, and time.     Cranial Nerves: No dysarthria.     Motor: Tremor present.     Gait: Gait normal.     Comments: No longer licks lips and puckers better with meds.  Less fidgety foot movements. No longer grunting. Tremor mild- moderate positional Minimal Stiff gait with reasonable pace.  Psychiatric:        Attention and Perception: Attention and perception normal.        Mood and Affect: Mood is anxious. Mood is not depressed.        Speech: Speech normal.        Behavior: Behavior is cooperative.        Thought Content: Thought content normal. Thought content is not paranoid or delusional. Thought content does not include homicidal or suicidal ideation. Thought content does not include homicidal or suicidal plan.        Cognition and Memory: Cognition and memory normal.        Judgment: Judgment normal.     Comments: Insight and judgment are fair.  Still significant tendency to worry.  She is less depressed than at times. No mania. Still fidgety     Lab Review:     Component Value Date/Time   NA 137 12/17/2017 1056   K 4.8 12/17/2017 1056   CL 98 12/17/2017 1056   CO2 23 12/17/2017 1056   GLUCOSE 89 12/17/2017 1056   GLUCOSE 87 06/06/2016 0959   BUN 15 12/17/2017 1056   CREATININE 0.64 12/17/2017 1056   CALCIUM 9.4 12/17/2017 1056   PROT 7.3 12/17/2017 1056   ALBUMIN 4.2 12/17/2017 1056   AST 30 12/17/2017 1056   ALT 26 12/17/2017 1056   ALKPHOS 77 12/17/2017 1056   BILITOT <0.2 12/17/2017 1056   GFRNONAA 90 12/17/2017 1056   GFRAA 104 12/17/2017 1056        Component Value Date/Time   WBC 6.7 12/17/2017 1056   WBC 6.1 06/06/2016 0959   RBC 4.27 12/17/2017 1056   RBC 4.64 06/06/2016 0959   HGB 13.1 12/17/2017 1056   HCT 38.2 12/17/2017 1056   PLT 329 12/17/2017 1056   MCV 90 12/17/2017 1056   MCH 30.7 12/17/2017 1056   MCH 30.0 06/06/2016 0959   MCHC 34.3 12/17/2017 1056   MCHC 33.7 06/06/2016 0959   RDW 14.0 12/17/2017 1056   LYMPHSABS 1.6 12/17/2017 1056   MONOABS 0.5 06/06/2016 0959   EOSABS 0.3 12/17/2017 1056   BASOSABS 0.0 12/17/2017 1056    No results found for: POCLITH, LITHIUM   No results found for: PHENYTOIN, PHENOBARB, VALPROATE, CBMZ   .res Assessment: Plan:    Tardive dyskinesia - Plan: Deutetrabenazine (AUSTEDO) 9 MG TABS  Panic disorder with agoraphobia - Plan: Oxcarbazepine (TRILEPTAL) 300 MG tablet, PARoxetine (PAXIL) 30 MG tablet  Generalized anxiety disorder - Plan: Oxcarbazepine (TRILEPTAL) 300 MG tablet, PARoxetine (PAXIL) 30 MG tablet  Bipolar I disorder ([place]) - Plan: Oxcarbazepine (TRILEPTAL) 300 MG tablet   Greater than 50% of face  to face time with patient was spent on counseling and coordination of care. We discussed Ms. Luc is a very complicated and difficult to treat bipolar patient with tardive dyskinesia.  It is complicated by the presence as well of mild cognitive impairment.  In addition complicated by multiple med failures and very limited options for mood stabilization.  The option that we are using still has a risk of drug drug interactions with the medication for tardive dyskinesia.  Overall she is dramatically more anxious without a clear reason unless due to reduction by mistake of oxcarbazepine.    Her tardive dyskinesia symptoms are markedly  improved on 18 mg Austedo BID.    oxcarbazepine to 1 in the morning and 2 at night of the 300 mg tablets. Anxiety is variable. lorazepam 0.5 mg QID and continue paroxetine 30 mg daily for anxity.  Her TD was very difficult to control.  12/29/19  AIMS: 25  Continue Austedo to 18 mg daily bc sig residual TD. She tolerated it and it helped tongue facial TD. Disc risk increased EPS. On Austedo 6+12 mg daily.  Will try changing to 9 mg 2 tablets BID and see if that's covered with 1 copayment which would make this more affordable. Currently TD is dramatically better without noticeable sig tongue and facial movements.  Still fidgety with legs but not severe.  Disc cost concerns with Austedo.  If necessary consider tetrabenazine but it's not FDA approved for TD and is much harder to use DT TID dosing.  Spending too much time in bed.  Needs to stay out of bed more.  Inactivity is causing the weakness.  Needs more exercise and rec walking.  Starting therapy for her arm and knee..  If CO more anxious and agitated but $ is a source of the problem.,  consider lamotrigine bc hesitant to increase paroxetine DT risk mood cycling.   We discussed the short-term risks associated with benzodiazepines including sedation and increased fall risk among others.  Discussed long-term side effect risk including dependence, potential withdrawal symptoms, and the potential eventual dose-related risk of dementia. OK continue to Ativan 0.5 to 1 mg HS per her request.    Consider Caplyta, lamotrigine for mood.  Continue carbidopa-levodopa 2 tablets 2 times daily. She feels that this is helped reduce her movement disorder and tremor issues.  FU 3-4 weeks  Lynder Parents, MD, DFAPA  Please see After Visit Summary for patient specific instructions.  Future Appointments  Date Time Provider [place]  12/20/2020 11:00 AM Penumalli, Earlean Polka, MD [facility] None    No orders of the defined types were placed in this encounter.     -------------------------------

## 2020-09-07 NOTE — Telephone Encounter (Signed)
Spoke with pt. She has a new insurance card. New card info ID [id number], GP [id number], pharmacy number [phone], provider services [phone]. Pt is aware that she will be contacted when I discuss the Viberzi cost further.

## 2020-09-08 ENCOUNTER — Telehealth: Payer: Self-pay

## 2020-09-08 NOTE — Progress Notes (Signed)
Patient is now approved with a grant through [employer] up to $10,000.00 through 08/08/2021.

## 2020-09-08 NOTE — Telephone Encounter (Signed)
Prior Approval received for AUSTEDO 9 MG effective 08/13/2020-08/12/2021 with [facility] ID# [id number].

## 2020-09-09 ENCOUNTER — Telehealth: Payer: Self-pay

## 2020-09-09 NOTE — Telephone Encounter (Signed)
Patient notified of her [place] she received submitted by [employer] in [place]. Effective through 08/08/2021 up to $10,000.00 with [employer] [phone].   [facility] [place], [place] given informationReola Calkins ID# [id number] BIN [id number] PCN [id number] GRP [id number]   Pharmacy will contact patient for delivery.

## 2020-09-13 NOTE — Telephone Encounter (Signed)
PA was submitted on [email or web address].  

## 2020-09-13 NOTE — Progress Notes (Signed)
Opened in error

## 2020-09-14 NOTE — Telephone Encounter (Signed)
PA for Viberzi has been approved through [email or web address]. left a detailed message of approval for pt and pts pharmacy. Approval letter will be scanned in pts chart.

## 2020-10-06 ENCOUNTER — Encounter: Payer: Self-pay | Admitting: Internal Medicine

## 2020-10-11 ENCOUNTER — Telehealth: Payer: Self-pay | Admitting: Internal Medicine

## 2020-10-11 NOTE — Telephone Encounter (Signed)
[phone]  Please call patient, she has a question about her assistance for her meds.  She can not remember what you told her

## 2020-10-11 NOTE — Telephone Encounter (Signed)
Spoke with pt. Pt is going to receive some pt assistant forms in the mail for [place]. Viberzi was covered when the PA was completed but the cost of $80.00 is too expensive for pt. Forms will be submitted when pt returns them.

## 2020-10-17 ENCOUNTER — Other Ambulatory Visit: Payer: Self-pay | Admitting: Psychiatry

## 2020-10-17 DIAGNOSIS — G2401 Drug induced subacute dyskinesia: Secondary | ICD-10-CM

## 2020-12-07 ENCOUNTER — Other Ambulatory Visit: Payer: Self-pay | Admitting: Diagnostic Neuroimaging

## 2020-12-20 ENCOUNTER — Ambulatory Visit (INDEPENDENT_AMBULATORY_CARE_PROVIDER_SITE_OTHER): Payer: Medicare HMO | Admitting: Diagnostic Neuroimaging

## 2020-12-20 ENCOUNTER — Encounter: Payer: Self-pay | Admitting: Diagnostic Neuroimaging

## 2020-12-20 VITALS — BP 136/70 | HR 108 | Ht 67.0 in | Wt 215.2 lb

## 2020-12-20 DIAGNOSIS — G20 Parkinson's disease: Secondary | ICD-10-CM

## 2020-12-20 DIAGNOSIS — G2401 Drug induced subacute dyskinesia: Secondary | ICD-10-CM | POA: Diagnosis not present

## 2020-12-20 MED ORDER — CARBIDOPA-LEVODOPA 25-100 MG PO TABS: 2.0000 | ORAL_TABLET | Freq: Two times a day (BID) | ORAL | 4 refills | Status: DC

## 2020-12-20 NOTE — Progress Notes (Signed)
Chief Complaint  Patient presents with  . [name]    Rm 6 one year FU  "still shake in my legs, but not in my face or arms; had panic attack last month, went to [name]"    History of Present Illness:  UPDATE (12/20/20, [name]): Since last visit, doing better. On higher dose of austedo which helps mouth and arm movements. Leg shaking continues.   UPDATE (12/15/19, [name]): Since last visit, in last 1 month more tardive dyskinesia (mouth movements) and more nausea. Lost her austedo 1 month ago, then had panic attack and went to [name]. Tolerating carb/levo 2 tabs twice a day.   UPDATE (07/16/18, [name]): Since last visit, had DATscan slightly positive, and then started on carb/levo, which is helping tremor in hands. Movements in mouth, tongue and legs continue. Continues on ingrezza.  PRIOR HPI (04/01/18): 75 year old female here for evaluation of abnormal involuntary movements.  Patient has a long history of depression, anxiety, bipolar disorder, has been on numerous psychiatry medications.  About 1 to 2 years ago she was started on [name], and after 6 to 12 months she developed abnormal involuntary movements of her mouth, tongue and arms.  [name] was stopped, but movements continued and worsened.  She has significant tremor and movements on her right arm, right leg, tongue and mouth.  Patient was started on medication [name] and symptoms have started to improve.  Symptoms have been managed by psychiatrist.  Patient saw PCP who recommended neurology consult.  Patient reports history of "nervous breakdown" in 1972, status post behavioral hospitalization and ECT treatments.    Observations/Objective:  GENERAL EXAM/CONSTITUTIONAL: Vitals:  Vitals:   12/20/20 1032  BP: 136/70  Pulse: (!) 108  Weight: 215 lb 3.2 oz (97.6 kg)  Height: 5\' 7"  (1.702 m)   Body mass index is 33.71 kg/m. Wt Readings from Last 3 Encounters:  12/20/20 215 lb 3.2 oz (97.6  kg)  12/15/19 203 lb (92.1 kg)  09/09/18 198 lb 9.6 oz (90.1 kg)    Patient is in no distress; well developed, nourished and groomed; neck is supple  CARDIOVASCULAR:  Examination of carotid arteries is normal; no carotid bruits  Regular rate and rhythm, no murmurs  Examination of peripheral vascular system by observation and palpation is normal  EYES:  Ophthalmoscopic exam of optic discs and posterior segments is normal; no papilledema or hemorrhages No exam data present  MUSCULOSKELETAL:  Gait, strength, tone, movements noted in Neurologic exam below  NEUROLOGIC: MENTAL STATUS:  No flowsheet data found.  awake, alert, oriented to person, place and time  recent and remote memory intact  normal attention and concentration  language fluent, comprehension intact, naming intact  fund of knowledge appropriate  CRANIAL NERVE:   2nd - no papilledema on fundoscopic exam  2nd, 3rd, 4th, 6th - pupils equal and reactive to light, visual fields full to confrontation, extraocular muscles intact, no nystagmus  5th - facial sensation symmetric  7th - facial strength symmetric  8th - hearing intact  9th - palate elevates symmetrically, uvula midline  11th - shoulder shrug symmetric  12th - tongue protrusion midline  MOTOR:   NO RESTING TREMOR  NO COGWHEELING RIGIDITY  MILD BRADYKINESIA IN BUE  AKATHESIA AND LEG ROCKING MOVEMENTS  MILD MOUTH TREMOR  normal bulk and tone, full strength in the BUE, BLE  SENSORY:   normal and symmetric to light touch  COORDINATION:   finger-nose-finger, fine finger movements  normal  REFLEXES:   deep tendon reflexes present and symmetric  GAIT/STATION:   SHORT STEPS; UNSTEADY   04/17/18 MRI brain - Unremarkable MRI brain (without). No acute findings.  05/22/18 datSCAN - Subtle loss of activity in the posterior putamen greater on the RIGHT is favored Parkinson's syndrome pathology pattern.   Assessment and  Plan:  75 y.o. female with:   Dx: tardive dyskinesia (moderate)+ parkinson's disease (mild)   PARKINSON'S DISEASE  - continue carb/levo 2 tabs twice a day - use rollator walker; recommend PT - second opinion at movement disorder center  NAUSEA (could be related to parkinson's disease or medication side effect) - continue phenergan as needed; follow up with GI  AKATHISIA / RESTLESSNESS - likely related to underling anxiety disorder  TARDIVE DYSKINESIA (improved) - follow up with Dr. Clovis Pu re: Renee Harder - second opinion at movement disorder center  Meds ordered this encounter  Medications  . carbidopa-levodopa (SINEMET IR) 25-100 MG tablet    Sig: Take 2 tablets by mouth 2 (two) times daily.    Dispense:  360 tablet    Refill:  4   Orders Placed This Encounter  Procedures  . Ambulatory referral to Neurology    Follow Up Instructions:  - Return in about 1 year (around 12/20/2021).      [name], MD 6/96/7893, 81:01 AM Certified in Neurology, Neurophysiology and Neuroimaging  [facility] [address], [place], [place] [address] [phone]

## 2020-12-22 ENCOUNTER — Telehealth: Payer: Self-pay | Admitting: Diagnostic Neuroimaging

## 2020-12-22 NOTE — Telephone Encounter (Signed)
Faxed referral to [facility]. Spoke with Herbie Baltimore her stated to fax the order and they will reach out to the patient to schedule their ph # [phone] & fax # [phone].

## 2020-12-26 ENCOUNTER — Other Ambulatory Visit: Payer: Self-pay | Admitting: Psychiatry

## 2020-12-26 DIAGNOSIS — F5105 Insomnia due to other mental disorder: Secondary | ICD-10-CM

## 2021-01-03 ENCOUNTER — Other Ambulatory Visit: Payer: Self-pay | Admitting: Gastroenterology

## 2021-01-05 ENCOUNTER — Telehealth: Payer: Self-pay

## 2021-01-05 NOTE — Telephone Encounter (Signed)
Annette Johnston,    On Tuesday the 24th there was a Rx for this pt left on the fax, and I was wondering was the pt coming by to get it or was it part of something else for this pt. Please advise.

## 2021-01-06 ENCOUNTER — Other Ambulatory Visit: Payer: Self-pay | Admitting: Gastroenterology

## 2021-01-10 NOTE — Telephone Encounter (Signed)
Just faxed it and notified the pt

## 2021-01-10 NOTE — Telephone Encounter (Signed)
Dena, it just needs to be faxed to her pharmacy. I can't send it electronically due to a system error. Sorry !

## 2021-01-26 ENCOUNTER — Other Ambulatory Visit: Payer: Self-pay | Admitting: Psychiatry

## 2021-01-26 DIAGNOSIS — F4001 Agoraphobia with panic disorder: Secondary | ICD-10-CM

## 2021-01-26 DIAGNOSIS — F411 Generalized anxiety disorder: Secondary | ICD-10-CM

## 2021-03-07 ENCOUNTER — Ambulatory Visit: Payer: Medicare HMO | Admitting: Psychiatry

## 2021-04-07 ENCOUNTER — Other Ambulatory Visit: Payer: Self-pay | Admitting: Psychiatry

## 2021-04-07 DIAGNOSIS — F319 Bipolar disorder, unspecified: Secondary | ICD-10-CM

## 2021-04-07 DIAGNOSIS — F411 Generalized anxiety disorder: Secondary | ICD-10-CM

## 2021-04-07 DIAGNOSIS — F4001 Agoraphobia with panic disorder: Secondary | ICD-10-CM

## 2021-04-07 DIAGNOSIS — F5105 Insomnia due to other mental disorder: Secondary | ICD-10-CM

## 2021-04-07 NOTE — Telephone Encounter (Signed)
Last filled 7/21 appt on 9/28

## 2021-04-07 NOTE — Telephone Encounter (Signed)
Pt called requesting refill for Lorazepam. Will be leaving on vacation 8/27 early AM for a week. Need to pick up before out of town. APT  9/28

## 2021-04-14 ENCOUNTER — Other Ambulatory Visit: Payer: Self-pay | Admitting: Psychiatry

## 2021-04-14 DIAGNOSIS — F4001 Agoraphobia with panic disorder: Secondary | ICD-10-CM

## 2021-04-14 DIAGNOSIS — F411 Generalized anxiety disorder: Secondary | ICD-10-CM

## 2021-04-24 ENCOUNTER — Encounter: Payer: Self-pay | Admitting: *Deleted

## 2021-05-10 ENCOUNTER — Encounter: Payer: Self-pay | Admitting: Psychiatry

## 2021-05-10 ENCOUNTER — Ambulatory Visit (INDEPENDENT_AMBULATORY_CARE_PROVIDER_SITE_OTHER): Payer: Medicare HMO | Admitting: Psychiatry

## 2021-05-10 ENCOUNTER — Other Ambulatory Visit: Payer: Self-pay

## 2021-05-10 DIAGNOSIS — F411 Generalized anxiety disorder: Secondary | ICD-10-CM

## 2021-05-10 DIAGNOSIS — F5105 Insomnia due to other mental disorder: Secondary | ICD-10-CM

## 2021-05-10 DIAGNOSIS — G3184 Mild cognitive impairment, so stated: Secondary | ICD-10-CM

## 2021-05-10 DIAGNOSIS — F319 Bipolar disorder, unspecified: Secondary | ICD-10-CM | POA: Diagnosis not present

## 2021-05-10 DIAGNOSIS — G2401 Drug induced subacute dyskinesia: Secondary | ICD-10-CM | POA: Diagnosis not present

## 2021-05-10 DIAGNOSIS — F4001 Agoraphobia with panic disorder: Secondary | ICD-10-CM

## 2021-05-10 NOTE — Progress Notes (Signed)
Annette Johnston [id number] Jan 20, 1946 75 y.o.    Subjective:   Patient ID:  Annette Johnston is a 75 y.o. (DOB March 13, 1946) female.  Chief Complaint:  Chief Complaint  Patient presents with   Follow-up   Depression   Medication Problem    TD   Anxiety   Fatigue    Anxiety Symptoms include decreased concentration. Patient reports no confusion, dizziness, nausea, nervous/anxious behavior, palpitations or suicidal ideas.     Annette Johnston presents to the office today for follow-up of recent worsening TD after stopping [name]  When seen October 23, 2018.  No med changes were made as of that date.  She had just gotten back on the [name] 120 mg a day which was helpful for her tardive dyskinesia symptoms.  At visit Dec 23, 2018.  No meds were changed.  She was spending too much time in bed and she was encouraged to get out of bed.  When seen April 27, 2019.  Because of racing thoughts oxcarbazepine was increased to 300 mg in the morning and 600 mg at night.  It helped. She was having significant residual tardive dyskinesia symptoms on [name] at a high dosage but she did not want to change medicines at that time.  However since that time she called and wanted to make the change to Austedo.  There have been multiple phone calls since September 14.  She called October 6 complaining that her jaw was locking up.  A trial of benztropine 0.5 mg twice daily was ineffective.  She called October 12 stating the tardive dyskinesia symptoms were worse.  There was some concern that perhaps Trileptal was inducing metabolism of [name].  We started the switch from [name] to [place].  She was given lorazepam 0.5 mg 4 times daily for anxiety.  As of October 21 she had increased Austedo to 12 mg twice daily and felt it was helpful.  When seen June 16, 2019 she was complaining of nausea.  Zofran was not effective.  She was given Phenergan.  There was a question as to whether it was related to  Austedo.  She was also encouraged to stop meloxicam.  seen July 08, 2019.  The following changes were made: Increase carbidopa-levodopa to 2 in the morning and 1 in the afternoon and 1 in the evening for 4 days. If tolerated but not helpful enough for balance then increase to 2 tablets 3 times daily. For anxiety add Ativan 1 tablet 3 times daily.  Doing better and less agitated with Ativan.  A lot of it was nerves.  Also less movement disorder with change in carbidopa-levodopa to 2 tablets 3 times daily.  Better balance.  Not in bed as much in the last 2 weeks.  I feel a big difference with the Ativan.  Less anxiety and panic.  seen July 29, 2019.  No meds were changed.  Overall she was satisfied with the meds at that time.  seen September 29, 2019.  She was having some racing thoughts and oxcarbazepine 300 mg was increased to 1 in the morning and 2 at night.  She continued paroxetine 30 mg daily, lorazepam 0.5 mg 3 times daily to 4 times daily, Austedo 12 mg twice daily, Sinemet 25-100 mg 2 tablets twice daily.  She called April 14 complaining of severe panic and was given extra lorazepam.  She claims compliance with medications.  She was scheduled for an urgent appointment the following day.    As of November 26, 2019 she reports the following. Increased anxiety and panic.  Felt SOB, dizzy and called ER bc choking.  Another one yesterday.  ER workup was OK.  Given 1 mg Ativan to ER.  That calmed me down.  Still SOB and constantly grunting and tongue twirling.  Mild slurring.  Came on suddenly on Tuesday.  Called neuro but couldn't get appt until May. Balance is poor. Plenty of sleep. Not grunting at night. She is not having manic symptoms  Note she's been taking 1 of 30 mg paroxetine daily not 15 mg as indicated previously in the chart. Plan: For  anxiety increase oxcarbazepine to 1 in the morning and 2 at night of the 300 mg tablets. Anxiety is still bad.   Increase lorazepam 0.5 mg  QID Continue Austedo 12 mg tablets, 1 twice daily.  Continue carbidopa levodopa 25-100 mg 2 tablets twice daily. Continue paroxetine  30 mg daily for anxiety.  12/29/2019 appointment, the following is noted: Started grunting when gets agitated.  Fidgety. Had panic after accidentally stopping Austedo for a week.  Better anxiety back on her meds.  Worsening tongue movements and sucking lip.   Nausea resolved when split up meds.  Upset hasn't seen daughter in 3 years and hasn't called last 2 mother's days.   No mania and depression is manageable.  Sleep is fine. Plan increase Austedo to 12 mg twice daily. Continue oxcarbazepine 300 mg in the morning and 600 mg nightly and lorazepam 0.5 mg 4 times daily and Sinemet 25-102 tablets twice daily, Continue paroxetine  30 mg daily for anxiety.  02/02/2020 appointment with with the following noted: Knee surgery and done OK with it so far.  StartingPT. Doing really well with increase Austedo 18 BID.  Less slinging tongue and arms.  Less sucking lips.  Tolerated Austedo without a problem.   Mood is pretty good.  Still lay around a lot.   Thinks walking will get better gradually.   Anxiety is pretty good. Still fidgety but not that bothersome.    Not usually angry..   Tolerating all meds.   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 Plan: No med changes today  09/07/2020 appointment with following noted: Still doesn't want to do anything.  [name] takes care of her.  Easily agitated.   Worries over looking for another grant for Austedo bc costs $180/month and can't afford that. On Austedo 6+12 mg daily.  Will try changing to 9 mg 2 tablets BID and see if that's covered with 1 copayment which would make this more affordable. Plan: No med changes  05/10/2021 appointment with the following noted: Bad panic couple mos ago and couldn't breathe.  Called 911. Took Ativan 0.5 mg prn.   More trouble with sleep initially. Doing nothing.   Weak  from even shower.  Sit in recliner all day. Get up 10 AM and to bed at 9.   Neuro sending her for further eval of movement disorder. $ stress.  Needs to sell land but H opposing it.  Past Psychiatric Medication Trials: Depakote jerks, lithium tremor, risperidone side effects, Seroquel 300, remote history of ECT, Latuda, [name], paroxetine 30 for panic, Equetro good response, oxcarbazepine 600 partial response, Saphris, lorazepam, sertraline,  [name] partial response Austedo 18 BID Lorazepam 0.5 3 times daily with good response  Review of Systems:  Review of Systems  Constitutional:  Positive for fatigue.  Cardiovascular:  Negative for palpitations.  Gastrointestinal:  Negative for  nausea.  Musculoskeletal:  Positive for arthralgias and joint swelling. Negative for back pain.  Neurological:  Positive for tremors and weakness. Negative for dizziness.       Poor balance  Psychiatric/Behavioral:  Positive for decreased concentration. Negative for agitation, behavioral problems, confusion, dysphoric mood, hallucinations, self-injury, sleep disturbance and suicidal ideas. The patient is not nervous/anxious and is not hyperactive.    Medications: I have reviewed the patient's current medications.  Current Outpatient Medications  Medication Sig Dispense Refill   AUSTEDO 9 MG TABS TAKE 2 TABLETS BY MOUTH IN THE MORNING AND AT BEDTIME. 120 tablet 11   carbidopa-levodopa (SINEMET IR) 25-100 MG tablet Take 2 tablets by mouth 2 (two) times daily. 360 tablet 4   diphenhydramine-acetaminophen (TYLENOL PM) 25-500 MG TABS tablet Take 2 tablets by mouth at bedtime as needed.      levothyroxine (SYNTHROID, LEVOTHROID) 137 MCG tablet Take 137 mcg by mouth daily before breakfast.     LORazepam (ATIVAN) 0.5 MG tablet TAKE 1 TABLET BY MOUTH EVERY MORNING TAKE 1 TABLET BY MOUTH AT NOON TAKE 1 TABLET BY MOUTH EVERY EVENING AND TAKE 1 TABLET BY MOUTH AT BEDTIME 120 tablet 0   Oxcarbazepine (TRILEPTAL)  300 MG tablet TAKE 1 TABLET BY MOUTH EVERY MORNING AND 2 TABLETS AT NIGHT 270 tablet 0   PARoxetine (PAXIL) 30 MG tablet TAKE 1 TABLET BY MOUTH EVERY DAY 90 tablet 0   promethazine (PHENERGAN) 25 MG tablet Take 1 tablet (25 mg total) by mouth every 8 (eight) hours as needed for nausea or vomiting. 30 tablet 6   VIBERZI 100 MG TABS TAKE 1 TABLET BY MOUTH TWICE A DAY WITH MEALS 60 tablet 3   No current facility-administered medications for this visit.    Medication Side Effects: None  Allergies:  Allergies  Allergen Reactions   Latex Rash    Bandaids    Past Medical History:  Diagnosis Date   Anxiety    Bipolar 1 disorder ([place])    Depression    Dyspnea on exertion july 2011   Normal stress test dec 2010. Reportedl normal CXR 2011. CT abdomen lung cut 2005  - normal. Normal PFT - march 2012   Hypothyroidism    Obesity (BMI 30-39.9)    BMI 36 Feb 2012   [name] [facility])    Urticaria     Family History  Problem Relation Age of Onset   Alzheimer's disease Mother    Heart attack Father    Coronary artery disease Brother    Heart attack Brother    Alzheimer's disease Paternal Grandmother    Allergic rhinitis Neg Hx    Angioedema Neg Hx    Asthma Neg Hx    Atopy Neg Hx    Eczema Neg Hx    Immunodeficiency Neg Hx    Urticaria Neg Hx    Colon cancer Neg Hx     Social History   Socioeconomic History   Marital status: Married    Spouse name: [name]   Number of children: 1   Years of education: Not on file   Highest education level: High school graduate  Occupational History   Occupation: retired  Tobacco Use   Smoking status: Never   Smokeless tobacco: Never  Vaping Use   Vaping Use: Never used  Substance and Sexual Activity   Alcohol use: Not Currently   Drug use: No   Sexual activity: Yes    Birth control/protection: Surgical  Other Topics Concern   Not on file  Social History Narrative   Caffeine 1 cup per day    Lives at home with husband    Right  Handed.    Social Determinants of Health   Financial Resource Strain: Not on file  Food Insecurity: Not on file  Transportation Needs: Not on file  Physical Activity: Not on file  Stress: Not on file  Social Connections: Not on file  Intimate Partner Violence: Not on file    Past Medical History, Surgical history, Social history, and Family history were reviewed and updated as appropriate.   Please see review of systems for further details on the patient's review from today.   Objective:   Physical Exam:  There were no vitals taken for this visit.  Physical Exam Neurological:     Mental Status: She is alert and oriented to person, place, and time.     Cranial Nerves: No dysarthria.     Motor: Tremor present.     Gait: Gait normal.     Comments: No longer licks lips and puckers better with meds.  More fidgety foot movements. No longer grunting. Tremor mild- moderate positional Minimal Stiff gait with reasonable pace.  Psychiatric:        Attention and Perception: Attention and perception normal.        Mood and Affect: Mood is anxious. Mood is not depressed.        Speech: Speech normal.        Behavior: Behavior is cooperative.        Thought Content: Thought content normal. Thought content is not paranoid or delusional. Thought content does not include homicidal or suicidal ideation. Thought content does not include homicidal or suicidal plan.        Cognition and Memory: Cognition and memory normal.        Judgment: Judgment normal.     Comments: Insight and judgment are fair.  Still significant tendency to worry.  She is less depressed than at times. No mania. Still fidgety Chronic stressed.    Lab Review:     Component Value Date/Time   NA 137 12/17/2017 1056   K 4.8 12/17/2017 1056   CL 98 12/17/2017 1056   CO2 23 12/17/2017 1056   GLUCOSE 89 12/17/2017 1056   GLUCOSE 87 06/06/2016 0959   BUN 15 12/17/2017 1056   CREATININE 0.64 12/17/2017 1056   CALCIUM  9.4 12/17/2017 1056   PROT 7.3 12/17/2017 1056   ALBUMIN 4.2 12/17/2017 1056   AST 30 12/17/2017 1056   ALT 26 12/17/2017 1056   ALKPHOS 77 12/17/2017 1056   BILITOT <0.2 12/17/2017 1056   GFRNONAA 90 12/17/2017 1056   GFRAA 104 12/17/2017 1056       Component Value Date/Time   WBC 6.7 12/17/2017 1056   WBC 6.1 06/06/2016 0959   RBC 4.27 12/17/2017 1056   RBC 4.64 06/06/2016 0959   HGB 13.1 12/17/2017 1056   HCT 38.2 12/17/2017 1056   PLT 329 12/17/2017 1056   MCV 90 12/17/2017 1056   MCH 30.7 12/17/2017 1056   MCH 30.0 06/06/2016 0959   MCHC 34.3 12/17/2017 1056   MCHC 33.7 06/06/2016 0959   RDW 14.0 12/17/2017 1056   LYMPHSABS 1.6 12/17/2017 1056   MONOABS 0.5 06/06/2016 0959   EOSABS 0.3 12/17/2017 1056   BASOSABS 0.0 12/17/2017 1056    No results found for: POCLITH, LITHIUM   No results found for: PHENYTOIN, PHENOBARB, VALPROATE, CBMZ   .res Assessment: Plan:    Bipolar I  disorder ([place])  Panic disorder with agoraphobia  Generalized anxiety disorder  Tardive dyskinesia  Mild cognitive impairment  Insomnia due to mental condition   Deconditioned  Greater than 50% of face to face time with patient was spent on counseling and coordination of care. We discussed Ms. Satchell is a very complicated and difficult to treat bipolar patient with tardive dyskinesia.  It is complicated by the presence as well of mild cognitive impairment.  In addition complicated by multiple med failures and very limited options for mood stabilization.  The option that we are using still has a risk of drug drug interactions with the medication for tardive dyskinesia.  Hx dramatically more anxious without a clear reason unless due to reduction by mistake of oxcarbazepine.    Her tardive dyskinesia symptoms are worse than last visit with fidgetiness  improved on 18 mg Austedo BID.    oxcarbazepine to 1 in the morning and 2 at night of the 300 mg tablets. Anxiety is variable. lorazepam  0.5 mg QID and continue paroxetine 30 mg daily for anxiety.  Her TD was very difficult to control.  12/29/19 AIMS: 25  Continue Austedo to 18 mg BID bc sig residual TD. She tolerated it and it helped tongue facial TD. Disc risk increased EPS. Currently TD is dramatically better without noticeable sig tongue and facial movements but quite fidgety with legs but not severe.  Spending too much time in bed.  Needs to stay out of bed more.  Inactivity is causing the weakness.  Needs more exercise and rec walking.  Starting therapy for her arm and knee..Deconditioned and needs PE and exercise to correct.  She fears falling.  If CO more anxious and agitated but $ is a source of the problem.,  consider lamotrigine bc hesitant to increase paroxetine DT risk mood cycling.     We discussed the short-term risks associated with benzodiazepines including sedation and increased fall risk among others.  Discussed long-term side effect risk including dependence, potential withdrawal symptoms, and the potential eventual dose-related risk of dementia. OK continue to Ativan 0.5 to 1 mg HS per her request.   OK extra Ativan prn panic.  Call if they are more frequent.  Consider Caplyta, lamotrigine for mood.  Continue carbidopa-levodopa 2 tablets 2 times daily or as neuro directs. She feels that this is helped reduce her movement disorder and tremor issues.  FU 3-4 mos  Lynder Parents, MD, DFAPA  Please see After Visit Summary for patient specific instructions.  Future Appointments  Date Time Provider [place]  05/26/2021  3:45 PM [facility] PAT 1 [facility] None  12/20/2021  1:30 PM Penumalli, Earlean Polka, MD [facility] None    No orders of the defined types were placed in this encounter.     -------------------------------

## 2021-05-15 ENCOUNTER — Other Ambulatory Visit: Payer: Self-pay | Admitting: Gastroenterology

## 2021-05-23 ENCOUNTER — Other Ambulatory Visit: Payer: Self-pay | Admitting: Psychiatry

## 2021-05-23 DIAGNOSIS — F4001 Agoraphobia with panic disorder: Secondary | ICD-10-CM

## 2021-05-23 DIAGNOSIS — F319 Bipolar disorder, unspecified: Secondary | ICD-10-CM

## 2021-05-23 DIAGNOSIS — F411 Generalized anxiety disorder: Secondary | ICD-10-CM

## 2021-05-25 ENCOUNTER — Other Ambulatory Visit: Payer: Self-pay | Admitting: Psychiatry

## 2021-05-25 DIAGNOSIS — F5105 Insomnia due to other mental disorder: Secondary | ICD-10-CM

## 2021-05-26 ENCOUNTER — Encounter (HOSPITAL_COMMUNITY)
Admission: RE | Admit: 2021-05-26 | Discharge: 2021-05-26 | Disposition: A | Discharge status: Home / Self Care | Payer: 59 | Source: Ambulatory Visit | Attending: Ophthalmology | Admitting: Ophthalmology

## 2021-05-26 ENCOUNTER — Encounter (HOSPITAL_COMMUNITY): Payer: Self-pay | Admitting: Anesthesiology

## 2021-05-26 ENCOUNTER — Other Ambulatory Visit: Payer: Self-pay

## 2021-05-26 ENCOUNTER — Encounter (HOSPITAL_COMMUNITY): Payer: Self-pay

## 2021-05-27 DIAGNOSIS — R531 Weakness: Secondary | ICD-10-CM | POA: Diagnosis not present

## 2021-05-27 DIAGNOSIS — R61 Generalized hyperhidrosis: Secondary | ICD-10-CM | POA: Diagnosis not present

## 2021-05-27 DIAGNOSIS — R410 Disorientation, unspecified: Secondary | ICD-10-CM | POA: Diagnosis not present

## 2021-05-27 DIAGNOSIS — R0689 Other abnormalities of breathing: Secondary | ICD-10-CM | POA: Diagnosis not present

## 2021-05-27 DIAGNOSIS — G459 Transient cerebral ischemic attack, unspecified: Secondary | ICD-10-CM | POA: Diagnosis not present

## 2021-05-27 DIAGNOSIS — I959 Hypotension, unspecified: Secondary | ICD-10-CM | POA: Diagnosis not present

## 2021-05-28 DIAGNOSIS — G459 Transient cerebral ischemic attack, unspecified: Secondary | ICD-10-CM | POA: Diagnosis not present

## 2021-05-29 DIAGNOSIS — G459 Transient cerebral ischemic attack, unspecified: Secondary | ICD-10-CM | POA: Diagnosis not present

## 2021-06-01 MED ORDER — EPINEPHRINE PF 1 MG/ML IJ SOLN
INTRAMUSCULAR | Status: AC
  Filled 2021-06-01: qty 2

## 2021-06-02 DIAGNOSIS — D649 Anemia, unspecified: Secondary | ICD-10-CM | POA: Diagnosis not present

## 2021-06-02 DIAGNOSIS — Z6833 Body mass index (BMI) 33.0-33.9, adult: Secondary | ICD-10-CM | POA: Diagnosis not present

## 2021-06-02 DIAGNOSIS — R350 Frequency of micturition: Secondary | ICD-10-CM | POA: Diagnosis not present

## 2021-06-02 DIAGNOSIS — R55 Syncope and collapse: Secondary | ICD-10-CM | POA: Diagnosis not present

## 2021-06-02 DIAGNOSIS — G459 Transient cerebral ischemic attack, unspecified: Secondary | ICD-10-CM | POA: Diagnosis not present

## 2021-06-02 DIAGNOSIS — R064 Hyperventilation: Secondary | ICD-10-CM | POA: Diagnosis not present

## 2021-06-02 DIAGNOSIS — R531 Weakness: Secondary | ICD-10-CM | POA: Diagnosis not present

## 2021-06-16 ENCOUNTER — Telehealth: Payer: Self-pay | Admitting: Psychiatry

## 2021-06-16 ENCOUNTER — Other Ambulatory Visit: Payer: Self-pay | Admitting: Psychiatry

## 2021-06-16 DIAGNOSIS — Z6833 Body mass index (BMI) 33.0-33.9, adult: Secondary | ICD-10-CM | POA: Diagnosis not present

## 2021-06-16 DIAGNOSIS — D649 Anemia, unspecified: Secondary | ICD-10-CM | POA: Diagnosis not present

## 2021-06-16 DIAGNOSIS — Z1389 Encounter for screening for other disorder: Secondary | ICD-10-CM | POA: Diagnosis not present

## 2021-06-16 DIAGNOSIS — G459 Transient cerebral ischemic attack, unspecified: Secondary | ICD-10-CM | POA: Diagnosis not present

## 2021-06-16 DIAGNOSIS — R531 Weakness: Secondary | ICD-10-CM | POA: Diagnosis not present

## 2021-06-16 DIAGNOSIS — Z1331 Encounter for screening for depression: Secondary | ICD-10-CM | POA: Diagnosis not present

## 2021-06-16 DIAGNOSIS — R55 Syncope and collapse: Secondary | ICD-10-CM | POA: Diagnosis not present

## 2021-06-16 NOTE — Telephone Encounter (Signed)
I left her a message to call here or my direct #

## 2021-06-16 NOTE — Telephone Encounter (Signed)
Even though she has been on these medications a long time it is more likely related to paroxetine or oxcarbazepine than Austedo.  Sometimes these meds will cause low sodium levels even after many years of taking them medicines.  The only way to tell is to take her off of 1 at a time and see if the sodium level gets better.  She should stay in close contact with her primary care doctor and make sure she follows their instructions.  She should ask them if she should take salt tablets.  However I think she needs to come off the paroxetine.  However cut the dose in half for 10 days and then stop it.  If she gets real dizzy or spacey from stopping it let us know and we will taper it more slowly.  She also needs to get an appointment with me and get on the cancellation list.

## 2021-06-16 NOTE — Telephone Encounter (Signed)
Pt called.  She is taking Austedo prescribed by Dr. Clovis Pu.  She went to the doc and hr sodium level is supposed to be 35 and it is 23.  He wants to know if the Austedo can make that happen.  He is concerned.  She wants a call back asap.  No upcoming appt.

## 2021-06-16 NOTE — Telephone Encounter (Signed)
Call and schedule her and put her on cancellation list

## 2021-06-16 NOTE — Telephone Encounter (Signed)
Pt called back and verbalized understanding of instructions. Will contact her PCP with information.

## 2021-06-16 NOTE — Telephone Encounter (Signed)
Routed to Dr. Clovis Pu

## 2021-06-19 ENCOUNTER — Ambulatory Visit: Payer: Medicare HMO | Admitting: Psychiatry

## 2021-06-20 ENCOUNTER — Encounter (HOSPITAL_COMMUNITY): Payer: 59

## 2021-06-21 ENCOUNTER — Telehealth: Payer: Self-pay | Admitting: Diagnostic Neuroimaging

## 2021-06-21 NOTE — Telephone Encounter (Signed)
Pt called she went to see her medical doctor and they advised her to call us to see if the carbidopa-levodopa (SINEMET IR) 25-100 MG tablet. Is the cause of her sodium level being low. Pt requesting a call back.

## 2021-06-21 NOTE — Telephone Encounter (Signed)
Faxed request to PCP to send [place] lab results.

## 2021-06-22 NOTE — Telephone Encounter (Addendum)
Received this message back from Dr. Leta Baptist.  Penumalli, Earlean Polka, MD  You 37 minutes ago (10:20 AM)   [name] not the cause of low sodium. -[name]    I called pt and relayed message via vm to pt, advised to f/u with PCP on further work up.   Telephone room staff can relay if pt calls back.

## 2021-06-22 NOTE — Telephone Encounter (Signed)
Received labs and have placed in MD's office for review. Will fwd TE note as well on this.

## 2021-06-23 NOTE — Telephone Encounter (Signed)
Patient had recent low sodium.  I had her wean off of the Paxil.  She was also to follow up with her primary care doctor.  The sodium was very low and of concern.  Please make sure that she is following up with her primary care doctor and having that reevaluated.

## 2021-06-26 ENCOUNTER — Encounter (HOSPITAL_COMMUNITY): Admission: RE | Payer: Self-pay | Source: Home / Self Care

## 2021-06-26 ENCOUNTER — Ambulatory Visit (HOSPITAL_COMMUNITY): Admission: RE | Admit: 2021-06-26 | Payer: 59 | Source: Home / Self Care | Admitting: Ophthalmology

## 2021-06-26 SURGERY — PHACOEMULSIFICATION, CATARACT, WITH IOL INSERTION
Anesthesia: Monitor Anesthesia Care | Laterality: Left

## 2021-06-26 NOTE — Telephone Encounter (Signed)
Pt did follow up with pcp.Will discuss at appt with you Wednesday

## 2021-06-26 NOTE — Telephone Encounter (Signed)
Low sodium labs reviewed from PCP (Na 124). Could be related to oxcarbazepine (rx'd by Dr. Clovis Pu).   Megan, please let patient and PCP know as well.    [name], MD 46/56/8127, 5:17 PM Certified in Neurology, Neurophysiology and Neuroimaging  [facility] [address] [place], [place] [address] [phone]

## 2021-06-28 ENCOUNTER — Other Ambulatory Visit: Payer: Self-pay

## 2021-06-28 ENCOUNTER — Encounter: Payer: Self-pay | Admitting: Psychiatry

## 2021-06-28 ENCOUNTER — Ambulatory Visit (INDEPENDENT_AMBULATORY_CARE_PROVIDER_SITE_OTHER): Payer: Medicare HMO | Admitting: Psychiatry

## 2021-06-28 DIAGNOSIS — F319 Bipolar disorder, unspecified: Secondary | ICD-10-CM

## 2021-06-28 DIAGNOSIS — G3184 Mild cognitive impairment, so stated: Secondary | ICD-10-CM

## 2021-06-28 DIAGNOSIS — F4001 Agoraphobia with panic disorder: Secondary | ICD-10-CM

## 2021-06-28 DIAGNOSIS — F5105 Insomnia due to other mental disorder: Secondary | ICD-10-CM

## 2021-06-28 DIAGNOSIS — G2401 Drug induced subacute dyskinesia: Secondary | ICD-10-CM

## 2021-06-28 DIAGNOSIS — F458 Other somatoform disorders: Secondary | ICD-10-CM

## 2021-06-28 DIAGNOSIS — F411 Generalized anxiety disorder: Secondary | ICD-10-CM | POA: Diagnosis not present

## 2021-06-28 DIAGNOSIS — E871 Hypo-osmolality and hyponatremia: Secondary | ICD-10-CM

## 2021-06-28 NOTE — Progress Notes (Signed)
Annette Johnston [birth year] 75 y.o.    Subjective:   Patient ID:  Annette Johnston is a 75 y.o. (DOB 1945/12/22) female.  Chief Complaint:  Chief Complaint  Patient presents with   Follow-up    Bipolar I disorder ([place])   Depression    Anxiety Symptoms include decreased concentration. Patient reports no chest pain, confusion, dizziness, nausea, nervous/anxious behavior, palpitations or suicidal ideas.    Depression        Associated symptoms include decreased concentration and fatigue.  Associated symptoms include no suicidal ideas.  Annette Johnston presents to the office today for follow-up of recent worsening TD after stopping [name]  When seen October 23, 2018.  No med changes were made as of that date.  She had just gotten back on the [name] 120 mg a day which was helpful for her tardive dyskinesia symptoms.  At visit Dec 23, 2018.  No meds were changed.  She was spending too much time in bed and she was encouraged to get out of bed.  When seen April 27, 2019.  Because of racing thoughts oxcarbazepine was increased to 300 mg in the morning and 600 mg at night.  It helped. She was having significant residual tardive dyskinesia symptoms on [name] at a high dosage but she did not want to change medicines at that time.  However since that time she called and wanted to make the change to Austedo.  There have been multiple phone calls since September 14.  She called October 6 complaining that her jaw was locking up.  A trial of benztropine 0.5 mg twice daily was ineffective.  She called October 12 stating the tardive dyskinesia symptoms were worse.  There was some concern that perhaps Trileptal was inducing metabolism of [name].  We started the switch from [name] to [place].  She was given lorazepam 0.5 mg 4 times daily for anxiety.  As of October 21 she had increased Austedo to 12 mg twice daily and felt it was helpful.  When seen June 16, 2019 she was  complaining of nausea.  Zofran was not effective.  She was given Phenergan.  There was a question as to whether it was related to Austedo.  She was also encouraged to stop meloxicam.  seen July 08, 2019.  The following changes were made: Increase carbidopa-levodopa to 2 in the morning and 1 in the afternoon and 1 in the evening for 4 days. If tolerated but not helpful enough for balance then increase to 2 tablets 3 times daily. For anxiety add Ativan 1 tablet 3 times daily.  Doing better and less agitated with Ativan.  A lot of it was nerves.  Also less movement disorder with change in carbidopa-levodopa to 2 tablets 3 times daily.  Better balance.  Not in bed as much in the last 2 weeks.  I feel a big difference with the Ativan.  Less anxiety and panic.  seen July 29, 2019.  No meds were changed.  Overall she was satisfied with the meds at that time.  seen September 29, 2019.  She was having some racing thoughts and oxcarbazepine 300 mg was increased to 1 in the morning and 2 at night.  She continued paroxetine 30 mg daily, lorazepam 0.5 mg 3 times daily to 4 times daily, Austedo 12 mg twice daily, Sinemet 25-100 mg 2 tablets twice daily.  She called April 14 complaining of severe panic and was given extra lorazepam.  She claims compliance with medications.  She was scheduled for an urgent appointment the following day.    As of November 26, 2019 she reports the following. Increased anxiety and panic.  Felt SOB, dizzy and called ER bc choking.  Another one yesterday.  ER workup was OK.  Given 1 mg Ativan to ER.  That calmed me down.  Still SOB and constantly grunting and tongue twirling.  Mild slurring.  Came on suddenly on Tuesday.  Called neuro but couldn't get appt until May. Balance is poor. Plenty of sleep. Not grunting at night. She is not having manic symptoms  Note she's been taking 1 of 30 mg paroxetine daily not 15 mg as indicated previously in the chart. Plan: For  anxiety increase  oxcarbazepine to 1 in the morning and 2 at night of the 300 mg tablets. Anxiety is still bad.   Increase lorazepam 0.5 mg QID Continue Austedo 12 mg tablets, 1 twice daily.  Continue carbidopa levodopa 25-100 mg 2 tablets twice daily. Continue paroxetine  30 mg daily for anxiety.  12/29/2019 appointment, the following is noted: Started grunting when gets agitated.  Fidgety. Had panic after accidentally stopping Austedo for a week.  Better anxiety back on her meds.  Worsening tongue movements and sucking lip.   Nausea resolved when split up meds.  Upset hasn't seen daughter in 3 years and hasn't called last 2 mother's days.   No mania and depression is manageable.  Sleep is fine. Plan increase Austedo to 12 mg twice daily. Continue oxcarbazepine 300 mg in the morning and 600 mg nightly and lorazepam 0.5 mg 4 times daily and Sinemet 25-102 tablets twice daily, Continue paroxetine  30 mg daily for anxiety.  02/02/2020 appointment with with the following noted: Knee surgery and done OK with it so far.  StartingPT. Doing really well with increase Austedo 18 BID.  Less slinging tongue and arms.  Less sucking lips.  Tolerated Austedo without a problem.   Mood is pretty good.  Still lay around a lot.   Thinks walking will get better gradually.   Anxiety is pretty good. Still fidgety but not that bothersome.    Not usually angry..   Tolerating all meds.   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 Plan: No med changes today  09/07/2020 appointment with following noted: Still doesn't want to do anything.  Annette Johnston takes care of her.  Easily agitated.   Worries over looking for another grant for Austedo bc costs $180/month and can't afford that. On Austedo 6+12 mg daily.  Will try changing to 9 mg 2 tablets BID and see if that's covered with 1 copayment which would make this more affordable. Plan: No med changes  05/10/2021 appointment with the following noted: Bad panic  couple mos ago and couldn't breathe.  Called 911. Took Ativan 0.5 mg prn.   More trouble with sleep initially. Doing nothing.  Weak  from even shower.  Sit in recliner all day. Get up 10 AM and to bed at 9.   Neuro sending her for further eval of movement disorder. $ stress.  Needs to sell land but H opposing it. Plan no med changes  06/16/2021 phone call: She has low sodium down to 123 and PCP is wondering if it is her psych meds. MD response: Even though she has been on these medications a long time Re: psych meds, hyponatremia  is more likely related to paroxetine or oxcarbazepine than Austedo.  Sometimes these meds will  cause low sodium levels even after many years of taking them medicines.  The only way to tell is to take her off of 1 at a time and see if the sodium level gets better.  She should stay in close contact with her primary care doctor and make sure she follows their instructions.  She should ask them if she should take salt tablets.  However I think she needs to come off the paroxetine.  However cut the dose in half for 10 days and then stop it.  If she gets real dizzy or spacey from stopping it let us know and we will taper it more slowly.  She also needs to get an appointment with me and get on the cancellation list.  06/28/2021 appointment with the following noted: Seen today after recent diagnosis of hyponatremia. FU PCP tomorrow.   Went to [facility] and neuro took her off Sinemet last week.  Hasn't noticed a change off it so far. She's been drinking Gatorade and sodium is better with last 129 up from 123. She didn't reduce the paroxetine as instructed bc "I was afraid to"  for fear of worsening anxiety and depression. "Ministroke" about a month ago causing bladder problems.   No mood swings.  Depressed and doesn't want to go out like she used to.  No interest or motivation.  Past Psychiatric Medication Trials: Depakote jerks, lithium tremor, Equetro good response,  oxcarbazepine 600 partial response, risperidone side effects, Seroquel 300,  Latuda, [name], Saphris paroxetine 30 for panic,   sertraline,  [name] partial response Austedo 18 BID Lorazepam 0.5 3 times daily with good response remote history of ECT,  Review of Systems:  Review of Systems  Constitutional:  Positive for fatigue.  Cardiovascular:  Negative for chest pain and palpitations.  Gastrointestinal:  Negative for nausea.  Musculoskeletal:  Positive for arthralgias and joint swelling. Negative for back pain.  Neurological:  Positive for tremors and weakness. Negative for dizziness.       Poor balance  Psychiatric/Behavioral:  Positive for decreased concentration and depression. Negative for agitation, behavioral problems, confusion, dysphoric mood, hallucinations, self-injury, sleep disturbance and suicidal ideas. The patient is not nervous/anxious and is not hyperactive.    Medications: I have reviewed the patient's current medications.  Current Outpatient Medications  Medication Sig Dispense Refill   AUSTEDO 9 MG TABS TAKE 2 TABLETS BY MOUTH IN THE MORNING AND AT BEDTIME. (Patient taking differently: Take 18 mg by mouth in the morning and at bedtime.) 120 tablet 11   diphenhydramine-acetaminophen (TYLENOL PM) 25-500 MG TABS tablet Take 2 tablets by mouth at bedtime as needed (sleep).     levothyroxine (SYNTHROID, LEVOTHROID) 137 MCG tablet Take 137 mcg by mouth daily before breakfast.     LORazepam (ATIVAN) 0.5 MG tablet TAKE 1 TABLET BY MOUTH EVERY MORNING TAKE 1 TABLET BY MOUTH AT NOON TAKE 1 TABLET BY MOUTH EVERY EVENING AND TAKE 1 TABLET BY MOUTH AT BEDTIME 120 tablet 5   Oxcarbazepine (TRILEPTAL) 300 MG tablet TAKE 1 TABLET BY MOUTH EVERY MORNING AND 2 TABLETS AT NIGHT 270 tablet 0   PARoxetine (PAXIL) 30 MG tablet TAKE 1 TABLET BY MOUTH EVERY DAY (Patient taking differently: Take 30 mg by mouth daily.) 90 tablet 0   promethazine (PHENERGAN) 25 MG tablet Take 1 tablet (25 mg  total) by mouth every 8 (eight) hours as needed for nausea or vomiting. 30 tablet 6   VIBERZI 100 MG TABS TAKE 1 TABLET BY MOUTH TWICE A DAY  WITH MEALS (Patient taking differently: Take 100 mg by mouth 2 (two) times daily.) 60 tablet 3   No current facility-administered medications for this visit.    Medication Side Effects: None  Allergies:  Allergies  Allergen Reactions   Latex Rash    Bandaids    Past Medical History:  Diagnosis Date   Anxiety    Bipolar 1 disorder ([place])    Depression    Dyspnea on exertion july 2011   Normal stress test dec 2010. Reportedl normal CXR 2011. CT abdomen lung cut 2005  - normal. Normal PFT - march 2012   Hypothyroidism    Obesity (BMI 30-39.9)    BMI 36 Feb 2012   [name] [facility])    Urticaria     Family History  Problem Relation Age of Onset   Alzheimer's disease Mother    Heart attack Father    Coronary artery disease Brother    Heart attack Brother    Alzheimer's disease Paternal Grandmother    Allergic rhinitis Neg Hx    Angioedema Neg Hx    Asthma Neg Hx    Atopy Neg Hx    Eczema Neg Hx    Immunodeficiency Neg Hx    Urticaria Neg Hx    Colon cancer Neg Hx     Social History   Socioeconomic History   Marital status: Married    Spouse name: Annette Johnston   Number of children: 1   Years of education: Not on file   Highest education level: High school graduate  Occupational History   Occupation: retired  Tobacco Use   Smoking status: Never   Smokeless tobacco: Never  Vaping Use   Vaping Use: Never used  Substance and Sexual Activity   Alcohol use: Not Currently   Drug use: No   Sexual activity: Yes    Birth control/protection: Surgical  Other Topics Concern   Not on file  Social History Narrative   Caffeine 1 cup per day    Lives at home with husband    Right Handed.    Social Determinants of Health   Financial Resource Strain: Not on file  Food Insecurity: Not on file  Transportation Needs: Not on file   Physical Activity: Not on file  Stress: Not on file  Social Connections: Not on file  Intimate Partner Violence: Not on file    Past Medical History, Surgical history, Social history, and Family history were reviewed and updated as appropriate.   Please see review of systems for further details on the patient's review from today.   Objective:   Physical Exam:  There were no vitals taken for this visit.  Physical Exam Neurological:     Mental Status: She is alert and oriented to person, place, and time.     Cranial Nerves: No dysarthria.     Motor: Tremor present.     Gait: Gait abnormal.     Comments: mildly licks lips and puckers better with meds.  Fidgety foot movements. No longer grunting. Tremor mild- moderate positional Minimal Stiff gait with fairly slow pace.  Psychiatric:        Attention and Perception: Attention and perception normal.        Mood and Affect: Mood is anxious and depressed.        Speech: Speech normal.        Behavior: Behavior is cooperative.        Thought Content: Thought content normal. Thought content is not paranoid or delusional. Thought  content does not include homicidal or suicidal ideation. Thought content does not include homicidal or suicidal plan.        Cognition and Memory: Cognition and memory normal.        Judgment: Judgment normal.     Comments: Insight and judgment are fair.  Still significant tendency to worry.  More depressed. No mania. Still fidgety Chronic stressed.    Lab Review:     Component Value Date/Time   NA 137 12/17/2017 1056   K 4.8 12/17/2017 1056   CL 98 12/17/2017 1056   CO2 23 12/17/2017 1056   GLUCOSE 89 12/17/2017 1056   GLUCOSE 87 06/06/2016 0959   BUN 15 12/17/2017 1056   CREATININE 0.64 12/17/2017 1056   CALCIUM 9.4 12/17/2017 1056   PROT 7.3 12/17/2017 1056   ALBUMIN 4.2 12/17/2017 1056   AST 30 12/17/2017 1056   ALT 26 12/17/2017 1056   ALKPHOS 77 12/17/2017 1056   BILITOT <0.2 12/17/2017  1056   GFRNONAA 90 12/17/2017 1056   GFRAA 104 12/17/2017 1056       Component Value Date/Time   WBC 6.7 12/17/2017 1056   WBC 6.1 06/06/2016 0959   RBC 4.27 12/17/2017 1056   RBC 4.64 06/06/2016 0959   HGB 13.1 12/17/2017 1056   HCT 38.2 12/17/2017 1056   PLT 329 12/17/2017 1056   MCV 90 12/17/2017 1056   MCH 30.7 12/17/2017 1056   MCH 30.0 06/06/2016 0959   MCHC 34.3 12/17/2017 1056   MCHC 33.7 06/06/2016 0959   RDW 14.0 12/17/2017 1056   LYMPHSABS 1.6 12/17/2017 1056   MONOABS 0.5 06/06/2016 0959   EOSABS 0.3 12/17/2017 1056   BASOSABS 0.0 12/17/2017 1056    No results found for: POCLITH, LITHIUM   No results found for: PHENYTOIN, PHENOBARB, VALPROATE, CBMZ   .res Assessment: Plan:    Bipolar I disorder ([place])  Panic disorder with agoraphobia  Generalized anxiety disorder  Tardive dyskinesia  Mild cognitive impairment  Insomnia due to mental condition  Bruxism (teeth grinding)  Hyponatremia   Deconditioned  Greater than 50% of 30 min face to face time with patient was spent on counseling and coordination of care. We discussed Ms. Bolick is a very complicated and difficult to treat bipolar patient with tardive dyskinesia.  It is complicated by the presence as well of mild cognitive impairment.  In addition complicated by multiple med failures and very limited options for mood stabilization.  The option that we are using still has a risk of drug drug interactions with the medication for tardive dyskinesia. Complaining of more anxious and depressed.   Re; hyponatremia: Even though she has been on these medications a long time Re: psych meds, hyponatremia  is more likely related to paroxetine or oxcarbazepine than Austedo.  She is afraid of stopping paroxetine because she is found it helpful for her anxiety and her anxiety is still not controlled.  We cannot stop oxcarbazepine without finding an alternative mood stabilizer. She is having her sodium rechecked  tomorrow by Dr. Quintin Alto.  If it is low then we will start [place] which is an antipsychotic with low EPS and low tardive dyskinesia risk.  It also tends to be helpful for bipolar depression.  After a few days of insuring she tolerates it we will start weaning the oxcarbazepine. Hopefully, she can afford Caplyta.  Her tardive dyskinesia symptoms are   improved on 18 mg Austedo BID.  But not resolved.  oxcarbazepine  1 in the morning  and 2 at night of the 300 mg tablets. Alternative Caplyta bc low EPS/TD risk but expensive and not certain it will be covered.  lorazepam 0.5 mg QID and continue paroxetine 30 mg daily for anxiety.  Her TD was very difficult to control.  Mild facial sx. Continue Austedo to 18 mg BID bc sig residual TD. She tolerated it and it helped tongue facial TD. Disc risk increased EPS.  Spending too much time in bed.  Needs to stay out of bed more.  Inactivity is causing the weakness.  Needs more exercise and rec walking.  Starting therapy for her arm and knee..Deconditioned and needs exercise to correct.  She fears falling.   We discussed the short-term risks associated with benzodiazepines including sedation and increased fall risk among others.  Discussed long-term side effect risk including dependence, potential withdrawal symptoms, and the potential eventual dose-related risk of dementia. OK continue to Ativan 0.5 to 1 mg HS per her request.   OK extra Ativan prn panic.  Call if they are more frequent.  Consider Caplyta, lamotrigine for mood. Gave samples of Caplyta but don't start it until we determine sodium is OK If Caplyta works then will taper oxcarbazepine.  Bruxism stopped.  FU 6 weeks  Lynder Parents, MD, DFAPA  Please see After Visit Summary for patient specific instructions.  Future Appointments  Date Time Provider [place]  08/29/2021  2:30 PM Cottle, Billey Co., MD [facility] None  12/20/2021  1:30 PM Penumalli, Earlean Polka, MD [facility] None    No  orders of the defined types were placed in this encounter.     -------------------------------

## 2021-06-29 DIAGNOSIS — Z6833 Body mass index (BMI) 33.0-33.9, adult: Secondary | ICD-10-CM | POA: Diagnosis not present

## 2021-06-29 DIAGNOSIS — D649 Anemia, unspecified: Secondary | ICD-10-CM | POA: Diagnosis not present

## 2021-06-29 DIAGNOSIS — G459 Transient cerebral ischemic attack, unspecified: Secondary | ICD-10-CM | POA: Diagnosis not present

## 2021-06-29 DIAGNOSIS — E871 Hypo-osmolality and hyponatremia: Secondary | ICD-10-CM | POA: Diagnosis not present

## 2021-06-29 DIAGNOSIS — R55 Syncope and collapse: Secondary | ICD-10-CM | POA: Diagnosis not present

## 2021-06-29 DIAGNOSIS — R531 Weakness: Secondary | ICD-10-CM | POA: Diagnosis not present

## 2021-06-29 NOTE — Telephone Encounter (Signed)
LVM advising patient Dr Leta Baptist reviewed labs from PCP. Her Na level is low 124, and the oxcarbazepine could be cause. It is prescribed by Dr Clovis Pu. I advised her I am letting her PCP know as well and it would be up to Dr Clovis Pu if med dose needed adjusting. Left # for questions. Called Dr Edythe Lynn office, LVM requesting call back.

## 2021-06-29 NOTE — Telephone Encounter (Signed)
Received call from [facility]  Dr Quintin Alto PCP office. I informed her that Dr Leta Baptist stated carb/levo would not be cause of low sodium. Oxcarbazepine could be the cause. It is Rx by Dr Clovis Pu. Alton Revere stated she thinks Dr Quintin Alto spoke with Dr Clovis Pu today but she will discuss with Dr Quintin Alto. She verbalized understanding, appreciation.

## 2021-06-30 ENCOUNTER — Other Ambulatory Visit: Payer: Self-pay | Admitting: Psychiatry

## 2021-06-30 DIAGNOSIS — F4001 Agoraphobia with panic disorder: Secondary | ICD-10-CM

## 2021-06-30 DIAGNOSIS — F411 Generalized anxiety disorder: Secondary | ICD-10-CM

## 2021-07-04 ENCOUNTER — Encounter: Payer: Self-pay | Admitting: Family Medicine

## 2021-07-17 ENCOUNTER — Telehealth: Payer: Self-pay | Admitting: Psychiatry

## 2021-07-17 NOTE — Telephone Encounter (Signed)
That is correct by reviewing phone message from 09/04/2020

## 2021-07-17 NOTE — Telephone Encounter (Signed)
Annette Johnston called because she is on Austedo and it has been paid by grant money.  Starting January she will need to re-enroll in that program so her medication will continue to be covered.  I'm pretty sure Sharyn Lull at [facility] has helped with her case in the past. Can we check with her about being sure [name]'s Austedo continues to be paid for by grant?

## 2021-07-18 ENCOUNTER — Encounter: Payer: Self-pay | Admitting: Gastroenterology

## 2021-07-21 NOTE — Telephone Encounter (Signed)
I will discuss with Sharyn Lull at [facility]

## 2021-07-26 ENCOUNTER — Telehealth: Payer: Self-pay | Admitting: Internal Medicine

## 2021-07-26 NOTE — Telephone Encounter (Signed)
Pt has questions about her Viberzi. Please call [phone]

## 2021-07-27 NOTE — Telephone Encounter (Signed)
Returned the pt's call and was somewhat advised that she is asking for help to pay for Viberzi. She states she remembers something being done for her last time that helped her pay. I looked through the notes and [facility] sent her pt assistance forms in the mail but there isn't anything scanned into the system. I asked her did she fill out forms she states she don't remember because she has parkinson disease????? I asked her how did she remember we did something for her then? She stated she didn't have to do anything to get help. I advised her that its been over a year since we seen her and if she needs a PA I will need updated information on her. She states she called several time but she was told she didn't need an appt. Then the other day [facility] set her one up. The pt just picked up her last refill today for Viberzi. Please advise.

## 2021-07-27 NOTE — Telephone Encounter (Signed)
Please provide Viberzi 75 mg samples. We also need to provide patient assistance forms.

## 2021-07-27 NOTE — Telephone Encounter (Signed)
Returned the pt's call and LMOVM for the pt to return call

## 2021-07-28 NOTE — Telephone Encounter (Signed)
Phoned and advised the pt of the samples and pt assistance forms left up front for her with instructions on the paperwork. Pt stated it will be Monday before she can pick it up.

## 2021-07-28 NOTE — Telephone Encounter (Signed)
Phoned and LMOVM for the pt to return call (advising as well half day for [place]). 2 boxes of Viberzi 75 mg and pt assistance forms with instructions bagged for the pt.

## 2021-08-09 ENCOUNTER — Other Ambulatory Visit: Payer: Self-pay | Admitting: Psychiatry

## 2021-08-09 DIAGNOSIS — F411 Generalized anxiety disorder: Secondary | ICD-10-CM

## 2021-08-09 DIAGNOSIS — F319 Bipolar disorder, unspecified: Secondary | ICD-10-CM

## 2021-08-09 DIAGNOSIS — F4001 Agoraphobia with panic disorder: Secondary | ICD-10-CM

## 2021-08-09 NOTE — Telephone Encounter (Signed)
90 day ok?

## 2021-08-29 ENCOUNTER — Other Ambulatory Visit: Payer: Self-pay

## 2021-08-29 ENCOUNTER — Ambulatory Visit (INDEPENDENT_AMBULATORY_CARE_PROVIDER_SITE_OTHER): Payer: Medicare HMO | Admitting: Psychiatry

## 2021-08-29 ENCOUNTER — Encounter: Payer: Self-pay | Admitting: Psychiatry

## 2021-08-29 DIAGNOSIS — G3184 Mild cognitive impairment, so stated: Secondary | ICD-10-CM

## 2021-08-29 DIAGNOSIS — F319 Bipolar disorder, unspecified: Secondary | ICD-10-CM | POA: Diagnosis not present

## 2021-08-29 DIAGNOSIS — F4001 Agoraphobia with panic disorder: Secondary | ICD-10-CM

## 2021-08-29 DIAGNOSIS — G20 Parkinson's disease: Secondary | ICD-10-CM

## 2021-08-29 DIAGNOSIS — F5105 Insomnia due to other mental disorder: Secondary | ICD-10-CM

## 2021-08-29 DIAGNOSIS — F411 Generalized anxiety disorder: Secondary | ICD-10-CM

## 2021-08-29 DIAGNOSIS — G2401 Drug induced subacute dyskinesia: Secondary | ICD-10-CM

## 2021-08-29 DIAGNOSIS — F458 Other somatoform disorders: Secondary | ICD-10-CM

## 2021-08-29 NOTE — Progress Notes (Signed)
Annette Johnston [id number] Aug 28, 1945 76 y.o.    Subjective:   Patient ID:  Annette Johnston is a 76 y.o. (DOB 1946-04-10) female.  Chief Complaint:  Chief Complaint  Patient presents with   Follow-up   Anxiety   Medication Problem   Depression    Anxiety Symptoms include decreased concentration. Patient reports no chest pain, confusion, dizziness, nausea, nervous/anxious behavior, palpitations or suicidal ideas.    Depression        Associated symptoms include decreased concentration and fatigue.  Associated symptoms include no suicidal ideas.  Past medical history includes anxiety.    Annette Johnston presents to the office today for follow-up of recent worsening TD after stopping [name]  When seen October 23, 2018.  No med changes were made as of that date.  She had just gotten back on the [name] 120 mg a day which was helpful for her tardive dyskinesia symptoms.  At visit Dec 23, 2018.  No meds were changed.  She was spending too much time in bed and she was encouraged to get out of bed.  When seen April 27, 2019.  Because of racing thoughts oxcarbazepine was increased to 300 mg in the morning and 600 mg at night.  It helped. She was having significant residual tardive dyskinesia symptoms on [name] at a high dosage but she did not want to change medicines at that time.  However since that time she called and wanted to make the change to Austedo.  There have been multiple phone calls since September 14.  She called October 6 complaining that her jaw was locking up.  A trial of benztropine 0.5 mg twice daily was ineffective.  She called October 12 stating the tardive dyskinesia symptoms were worse.  There was some concern that perhaps Trileptal was inducing metabolism of [name].  We started the switch from [name] to [place].  She was given lorazepam 0.5 mg 4 times daily for anxiety.  As of October 21 she had increased Austedo to 12 mg twice daily and felt it was  helpful.  When seen June 16, 2019 she was complaining of nausea.  Zofran was not effective.  She was given Phenergan.  There was a question as to whether it was related to Austedo.  She was also encouraged to stop meloxicam.  seen July 08, 2019.  The following changes were made: Increase carbidopa-levodopa to 2 in the morning and 1 in the afternoon and 1 in the evening for 4 days. If tolerated but not helpful enough for balance then increase to 2 tablets 3 times daily. For anxiety add Ativan 1 tablet 3 times daily.  Doing better and less agitated with Ativan.  A lot of it was nerves.  Also less movement disorder with change in carbidopa-levodopa to 2 tablets 3 times daily.  Better balance.  Not in bed as much in the last 2 weeks.  I feel a big difference with the Ativan.  Less anxiety and panic.  seen July 29, 2019.  No meds were changed.  Overall she was satisfied with the meds at that time.  seen September 29, 2019.  She was having some racing thoughts and oxcarbazepine 300 mg was increased to 1 in the morning and 2 at night.  She continued paroxetine 30 mg daily, lorazepam 0.5 mg 3 times daily to 4 times daily, Austedo 12 mg twice daily, Sinemet 25-100 mg 2 tablets twice daily.  She called April 14 complaining of severe panic and was given  extra lorazepam.  She claims compliance with medications.  She was scheduled for an urgent appointment the following day.    As of November 26, 2019 she reports the following. Increased anxiety and panic.  Felt SOB, dizzy and called ER bc choking.  Another one yesterday.  ER workup was OK.  Given 1 mg Ativan to ER.  That calmed me down.  Still SOB and constantly grunting and tongue twirling.  Mild slurring.  Came on suddenly on Tuesday.  Called neuro but couldn't get appt until May. Balance is poor. Plenty of sleep. Not grunting at night. She is not having manic symptoms  Note she's been taking 1 of 30 mg paroxetine daily not 15 mg as indicated  previously in the chart. Plan: For  anxiety increase oxcarbazepine to 1 in the morning and 2 at night of the 300 mg tablets. Anxiety is still bad.   Increase lorazepam 0.5 mg QID Continue Austedo 12 mg tablets, 1 twice daily.  Continue carbidopa levodopa 25-100 mg 2 tablets twice daily. Continue paroxetine  30 mg daily for anxiety.  12/29/2019 appointment, the following is noted: Started grunting when gets agitated.  Fidgety. Had panic after accidentally stopping Austedo for a week.  Better anxiety back on her meds.  Worsening tongue movements and sucking lip.   Nausea resolved when split up meds.  Upset hasn't seen daughter in 3 years and hasn't called last 2 mother's days.   No mania and depression is manageable.  Sleep is fine. Plan increase Austedo to 12 mg twice daily. Continue oxcarbazepine 300 mg in the morning and 600 mg nightly and lorazepam 0.5 mg 4 times daily and Sinemet 25-102 tablets twice daily, Continue paroxetine  30 mg daily for anxiety.  02/02/2020 appointment with with the following noted: Knee surgery and done OK with it so far.  StartingPT. Doing really well with increase Austedo 18 BID.  Less slinging tongue and arms.  Less sucking lips.  Tolerated Austedo without a problem.   Mood is pretty good.  Still lay around a lot.   Thinks walking will get better gradually.   Anxiety is pretty good. Still fidgety but not that bothersome.    Not usually angry..   Tolerating all meds.   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 Plan: No med changes today  09/07/2020 appointment with following noted: Still doesn't want to do anything.  [name] takes care of her.  Easily agitated.   Worries over looking for another grant for Austedo bc costs $180/month and can't afford that. On Austedo 6+12 mg daily.  Will try changing to 9 mg 2 tablets BID and see if that's covered with 1 copayment which would make this more affordable. Plan: No med changes  05/10/2021  appointment with the following noted: Bad panic couple mos ago and couldn't breathe.  Called 911. Took Ativan 0.5 mg prn.   More trouble with sleep initially. Doing nothing.  Weak  from even shower.  Sit in recliner all day. Get up 10 AM and to bed at 9.   Neuro sending her for further eval of movement disorder. $ stress.  Needs to sell land but H opposing it. Plan no med changes  06/16/2021 phone call: She has low sodium down to 123 and PCP is wondering if it is her psych meds. MD response: Even though she has been on these medications a long time Re: psych meds, hyponatremia  is more likely related to paroxetine  or oxcarbazepine than Austedo.  Sometimes these meds will cause low sodium levels even after many years of taking them medicines.  The only way to tell is to take her off of 1 at a time and see if the sodium level gets better.  She should stay in close contact with her primary care doctor and make sure she follows their instructions.  She should ask them if she should take salt tablets.  However I think she needs to come off the paroxetine.  However cut the dose in half for 10 days and then stop it.  If she gets real dizzy or spacey from stopping it let us know and we will taper it more slowly.  She also needs to get an appointment with me and get on the cancellation list.  06/28/2021 appointment with the following noted: Seen today after recent diagnosis of hyponatremia. FU PCP tomorrow.   Went to [facility] and neuro took her off Sinemet last week.  Hasn't noticed a change off it so far. She's been drinking Gatorade and sodium is better with last 129 up from 123. She didn't reduce the paroxetine as instructed bc "I was afraid to"  for fear of worsening anxiety and depression. "Ministroke" about a month ago causing bladder problems.   No mood swings.  Depressed and doesn't want to go out like she used to.  No interest or motivation.  08/29/2021 appt noted: DX PD by neuro [facility] and  started Sinemet Still some depression and not getting out of recliner much.  Will start PT Stayed on oxcarbazepine DT sodium "better" per Dr. Quintin Alto. Weight gain bc was told to drink Gatorade for sodium.   Past Psychiatric Medication Trials: Depakote jerks, lithium tremor, Equetro good response, oxcarbazepine 600 partial response, risperidone side effects, Seroquel 300,  Latuda, [name], Saphris paroxetine 30 for panic,   sertraline,  [name] partial response Austedo 18 BID Lorazepam 0.5 3 times daily with good response remote history of ECT,  Review of Systems:  Review of Systems  Constitutional:  Positive for fatigue.  Cardiovascular:  Negative for chest pain and palpitations.  Gastrointestinal:  Negative for nausea.  Musculoskeletal:  Positive for arthralgias, gait problem and joint swelling. Negative for back pain.  Neurological:  Positive for tremors and weakness. Negative for dizziness.       Poor balance  Psychiatric/Behavioral:  Positive for decreased concentration and dysphoric mood. Negative for agitation, behavioral problems, confusion, hallucinations, self-injury, sleep disturbance and suicidal ideas. The patient is not nervous/anxious and is not hyperactive.    Medications: I have reviewed the patient's current medications.  Current Outpatient Medications  Medication Sig Dispense Refill   AUSTEDO 9 MG TABS TAKE 2 TABLETS BY MOUTH IN THE MORNING AND AT BEDTIME. (Patient taking differently: Take 18 mg by mouth in the morning and at bedtime.) 120 tablet 11   carbidopa-levodopa (SINEMET IR) 25-100 MG tablet Take 1 tablet by mouth 3 (three) times daily.     diphenhydramine-acetaminophen (TYLENOL PM) 25-500 MG TABS tablet Take 2 tablets by mouth at bedtime as needed (sleep).     levothyroxine (SYNTHROID, LEVOTHROID) 137 MCG tablet Take 137 mcg by mouth daily before breakfast.     LORazepam (ATIVAN) 0.5 MG tablet TAKE 1 TABLET BY MOUTH EVERY MORNING TAKE 1 TABLET BY MOUTH AT  NOON TAKE 1 TABLET BY MOUTH EVERY EVENING AND TAKE 1 TABLET BY MOUTH AT BEDTIME 120 tablet 5   Oxcarbazepine (TRILEPTAL) 300 MG tablet TAKE 1 TABLET BY MOUTH EVERY MORNING AND  2 TABLETS AT NIGHT 270 tablet 0   PARoxetine (PAXIL) 30 MG tablet TAKE 1 TABLET BY MOUTH EVERY DAY 90 tablet 0   promethazine (PHENERGAN) 25 MG tablet Take 1 tablet (25 mg total) by mouth every 8 (eight) hours as needed for nausea or vomiting. 30 tablet 6   VIBERZI 100 MG TABS TAKE 1 TABLET BY MOUTH TWICE A DAY WITH MEALS (Patient taking differently: Take 100 mg by mouth 2 (two) times daily.) 60 tablet 3   No current facility-administered medications for this visit.    Medication Side Effects: None  Allergies:  Allergies  Allergen Reactions   Latex Rash    Bandaids    Past Medical History:  Diagnosis Date   Anxiety    Bipolar 1 disorder ([place])    Depression    Dyspnea on exertion july 2011   Normal stress test dec 2010. Reportedl normal CXR 2011. CT abdomen lung cut 2005  - normal. Normal PFT - march 2012   Hypothyroidism    Obesity (BMI 30-39.9)    BMI 36 Feb 2012   [name] [facility])    Urticaria     Family History  Problem Relation Age of Onset   Alzheimer's disease Mother    Heart attack Father    Coronary artery disease Brother    Heart attack Brother    Alzheimer's disease Paternal Grandmother    Allergic rhinitis Neg Hx    Angioedema Neg Hx    Asthma Neg Hx    Atopy Neg Hx    Eczema Neg Hx    Immunodeficiency Neg Hx    Urticaria Neg Hx    Colon cancer Neg Hx     Social History   Socioeconomic History   Marital status: Married    Spouse name: [name]   Number of children: 1   Years of education: Not on file   Highest education level: High school graduate  Occupational History   Occupation: retired  Tobacco Use   Smoking status: Never   Smokeless tobacco: Never  Vaping Use   Vaping Use: Never used  Substance and Sexual Activity   Alcohol use: Not Currently   Drug use: No    Sexual activity: Yes    Birth control/protection: Surgical  Other Topics Concern   Not on file  Social History Narrative   Caffeine 1 cup per day    Lives at home with husband    Right Handed.    Social Determinants of Health   Financial Resource Strain: Not on file  Food Insecurity: Not on file  Transportation Needs: Not on file  Physical Activity: Not on file  Stress: Not on file  Social Connections: Not on file  Intimate Partner Violence: Not on file    Past Medical History, Surgical history, Social history, and Family history were reviewed and updated as appropriate.   Please see review of systems for further details on the patient's review from today.   Objective:   Physical Exam:  There were no vitals taken for this visit.  Physical Exam Neurological:     Mental Status: She is alert and oriented to person, place, and time.     Cranial Nerves: No dysarthria.     Motor: Tremor present.     Gait: Gait abnormal.     Comments: mildly licks lips and puckers better with meds.  Fidgety foot movements. No longer grunting. Tremor mild- moderate positional Minimal Stiff gait with fairly slow pace.  Psychiatric:  Attention and Perception: Attention and perception normal.        Mood and Affect: Mood is anxious and depressed.        Speech: Speech normal. Speech is not rapid and pressured.        Behavior: Behavior is cooperative.        Thought Content: Thought content normal. Thought content is not paranoid or delusional. Thought content does not include homicidal or suicidal ideation. Thought content does not include suicidal plan.        Cognition and Memory: Cognition and memory normal.        Judgment: Judgment normal.     Comments: Insight and judgment are fair.  Still significant tendency to worry.  Mild mod depressed. No mania. Still fidgety Chronic stressed.    Lab Review:     Component Value Date/Time   NA 137 12/17/2017 1056   K 4.8 12/17/2017 1056    CL 98 12/17/2017 1056   CO2 23 12/17/2017 1056   GLUCOSE 89 12/17/2017 1056   GLUCOSE 87 06/06/2016 0959   BUN 15 12/17/2017 1056   CREATININE 0.64 12/17/2017 1056   CALCIUM 9.4 12/17/2017 1056   PROT 7.3 12/17/2017 1056   ALBUMIN 4.2 12/17/2017 1056   AST 30 12/17/2017 1056   ALT 26 12/17/2017 1056   ALKPHOS 77 12/17/2017 1056   BILITOT <0.2 12/17/2017 1056   GFRNONAA 90 12/17/2017 1056   GFRAA 104 12/17/2017 1056       Component Value Date/Time   WBC 6.7 12/17/2017 1056   WBC 6.1 06/06/2016 0959   RBC 4.27 12/17/2017 1056   RBC 4.64 06/06/2016 0959   HGB 13.1 12/17/2017 1056   HCT 38.2 12/17/2017 1056   PLT 329 12/17/2017 1056   MCV 90 12/17/2017 1056   MCH 30.7 12/17/2017 1056   MCH 30.0 06/06/2016 0959   MCHC 34.3 12/17/2017 1056   MCHC 33.7 06/06/2016 0959   RDW 14.0 12/17/2017 1056   LYMPHSABS 1.6 12/17/2017 1056   MONOABS 0.5 06/06/2016 0959   EOSABS 0.3 12/17/2017 1056   BASOSABS 0.0 12/17/2017 1056    No results found for: POCLITH, LITHIUM   No results found for: PHENYTOIN, PHENOBARB, VALPROATE, CBMZ   .res Assessment: Plan:    Bipolar I disorder ([place])  Panic disorder with agoraphobia  Generalized anxiety disorder  Tardive dyskinesia  Mild cognitive impairment  Insomnia due to mental condition  Bruxism (teeth grinding)  Parkinson's disease ([place])   Deconditioned  Greater than 50% of 30 min face to face time with patient was spent on counseling and coordination of care. We discussed Ms. Sinyard is a very complicated and difficult to treat bipolar patient with tardive dyskinesia.  It is complicated by the presence as well of mild cognitive impairment which appears stable..  In addition complicated by multiple med failures and very limited options for mood stabilization.  The option that we are using still has a risk of drug drug interactions with the medication for tardive dyskinesia.  Complaining of depressed without motivation, energy.   Re;  hyponatremia: Even though she has been on these medications a long time Re: psych meds, hyponatremia  is more likely related to paroxetine or oxcarbazepine than Austedo.  She is afraid of stopping paroxetine because she is found it helpful for her anxiety and her anxiety is still not controlled.  We cannot stop oxcarbazepine without finding an alternative mood stabilizer. She is having her sodium rechecked tomorrow by Dr. Quintin Alto.  If it is  low then we will start Duplin which is an antipsychotic with low EPS and low tardive dyskinesia risk.  It also tends to be helpful for bipolar depression.  After a few days of insuring she tolerates it we will start weaning the oxcarbazepine. she never tried Caplyta. She never stopped oxcarbazepine bc Dr. Quintin Alto said sodium was better. Option add salt tablet twice daily.  Her tardive dyskinesia symptoms are   improved on 18 mg Austedo BID.  But not resolved. Brain scan and neurology [facility] DX PD Jan 2023 and restarted Sinemet without benefit yet  oxcarbazepine  1 in the morning and 2 at night of the 300 mg tablets. Alternative Caplyta bc low EPS/TD risk but expensive and not certain it will be covered.  lorazepam 0.5 mg QID and continue paroxetine 30 mg daily for anxiety.  Her TD was very difficult to control.  Mild facial sx. Fidgety.  Continue Austedo to 18 mg BID bc sig residual TD. She tolerated it and it helped tongue facial TD. Disc risk increased EPS.  Spending too much time in bed.  Needs to stay out of bed more.  Inactivity is causing the weakness.  Needs more exercise and rec walking.  Starting therapy for her arm and knee..Deconditioned and needs exercise to correct.  She fears falling.  We discussed the short-term risks associated with benzodiazepines including sedation and increased fall risk among others.  Discussed long-term side effect risk including dependence, potential withdrawal symptoms, and the potential eventual dose-related risk of  dementia.  But recent studies from 2020 dispute this association between benzodiazepines and dementia risk. Newer studies in 2020 do not support an association with dementia.  OK continue to Ativan 0.5 to 1 mg HS per her request.   OK extra Ativan prn panic.  Call if they are more frequent.  Consider Caplyta, lamotrigine for mood. She's reluctant to start Brand meds.  Hesitant to add meds DT low sodium and adjusting Sinemet  Bruxism stopped.  FU 6 weeks  Lynder Parents, MD, DFAPA  Please see After Visit Summary for patient specific instructions.  Future Appointments  Date Time Provider [place]  11/27/2021  1:30 PM Erenest Rasher, PA-C [facility]  12/19/2021  4:00 PM Penumalli, Earlean Polka, MD [facility] None    No orders of the defined types were placed in this encounter.      -------------------------------

## 2021-09-13 ENCOUNTER — Telehealth: Payer: Self-pay

## 2021-09-13 NOTE — Telephone Encounter (Signed)
[facility] in [place] to discuss pt's grant. It did expire in December 2022, they report reaching out to her and informed her she would have to renew it. They do have a delivery of Austedo going out today and she just had to pay a co-pay. Once she reapply's for grant with approval they will reimburse her for co-pay.    [facility] [phone]

## 2021-09-14 ENCOUNTER — Other Ambulatory Visit: Payer: Self-pay

## 2021-09-14 DIAGNOSIS — G2401 Drug induced subacute dyskinesia: Secondary | ICD-10-CM

## 2021-09-14 MED ORDER — AUSTEDO 9 MG PO TABS: ORAL_TABLET | ORAL | 11 refills | Status: DC

## 2021-09-14 NOTE — Telephone Encounter (Signed)
Just Faith Rogue with [employer] will reapply for the grant for [place]. We will now send Austedo Rx's to [place].    Rx's sent to [place].

## 2021-10-02 ENCOUNTER — Encounter: Payer: Self-pay | Admitting: Internal Medicine

## 2021-10-02 ENCOUNTER — Other Ambulatory Visit: Payer: Self-pay | Admitting: Gastroenterology

## 2021-10-02 NOTE — Telephone Encounter (Signed)
She is on 100 mg  Noted will send message to [facility] for ov.  Stacey please schedule this pt for an office visit

## 2021-10-02 NOTE — Telephone Encounter (Signed)
Hi Dena, is patient on 75 mg or 100 mg? I will refill what she is on, but she needs to  have an office visit.

## 2021-10-03 ENCOUNTER — Telehealth: Payer: Self-pay | Admitting: Internal Medicine

## 2021-10-03 NOTE — Telephone Encounter (Signed)
Pt has questions about her Viberzi. [phone]

## 2021-10-04 ENCOUNTER — Other Ambulatory Visit: Payer: Self-pay | Admitting: Gastroenterology

## 2021-10-04 NOTE — Telephone Encounter (Signed)
Rx faxed to [facility] in [place] 2 boxes of samples left up front for the pt to pick up

## 2021-10-04 NOTE — Telephone Encounter (Signed)
Returned the pt's call and LMOVM. 

## 2021-10-04 NOTE — Telephone Encounter (Signed)
Already handled.

## 2021-10-09 ENCOUNTER — Encounter (HOSPITAL_COMMUNITY): Payer: 59

## 2021-10-12 ENCOUNTER — Other Ambulatory Visit: Payer: Self-pay | Admitting: Psychiatry

## 2021-10-12 DIAGNOSIS — F411 Generalized anxiety disorder: Secondary | ICD-10-CM

## 2021-10-12 DIAGNOSIS — F4001 Agoraphobia with panic disorder: Secondary | ICD-10-CM

## 2021-10-13 ENCOUNTER — Ambulatory Visit: Admit: 2021-10-13 | Payer: 59 | Admitting: Ophthalmology

## 2021-10-13 SURGERY — PHACOEMULSIFICATION, CATARACT, WITH IOL INSERTION
Anesthesia: Monitor Anesthesia Care | Laterality: Left

## 2021-10-24 ENCOUNTER — Ambulatory Visit: Payer: Medicare HMO | Admitting: Psychiatry

## 2021-10-27 ENCOUNTER — Encounter: Payer: Self-pay | Admitting: Psychiatry

## 2021-10-27 ENCOUNTER — Ambulatory Visit (INDEPENDENT_AMBULATORY_CARE_PROVIDER_SITE_OTHER): Payer: Medicare HMO | Admitting: Psychiatry

## 2021-10-27 DIAGNOSIS — F5105 Insomnia due to other mental disorder: Secondary | ICD-10-CM

## 2021-10-27 DIAGNOSIS — F319 Bipolar disorder, unspecified: Secondary | ICD-10-CM

## 2021-10-27 DIAGNOSIS — F4001 Agoraphobia with panic disorder: Secondary | ICD-10-CM | POA: Diagnosis not present

## 2021-10-27 DIAGNOSIS — E871 Hypo-osmolality and hyponatremia: Secondary | ICD-10-CM

## 2021-10-27 DIAGNOSIS — F411 Generalized anxiety disorder: Secondary | ICD-10-CM | POA: Diagnosis not present

## 2021-10-27 DIAGNOSIS — G2401 Drug induced subacute dyskinesia: Secondary | ICD-10-CM

## 2021-10-27 DIAGNOSIS — G20A1 Parkinson's disease without dyskinesia, without mention of fluctuations: Secondary | ICD-10-CM

## 2021-10-27 DIAGNOSIS — G20 Parkinson's disease: Secondary | ICD-10-CM

## 2021-10-27 DIAGNOSIS — G3184 Mild cognitive impairment, so stated: Secondary | ICD-10-CM

## 2021-10-27 NOTE — Progress Notes (Signed)
Annette Johnston ?[id number] ?1945-08-20 ?76 y.o.  ? ?Virtual Visit via Telephone Note ? ?I connected with pt by telephone and verified that I am speaking with the correct person using two identifiers. ?  ?I discussed the limitations, risks, security and privacy concerns of performing an evaluation and management service by telephone and the availability of in person appointments. I also discussed with the patient that there may be a patient responsible charge related to this service. The patient expressed understanding and agreed to proceed. ? ?I discussed the assessment and treatment plan with the patient. The patient was provided an opportunity to ask questions and all were answered. The patient agreed with the plan and demonstrated an understanding of the instructions. ?  ?The patient was advised to call back or seek an in-person evaluation if the symptoms worsen or if the condition fails to improve as anticipated. ? ?I provided 30 minutes of non-face-to-face time during this encounter. The call started at 5 and ended at 530. The patient was located at home and the provider was located office. ?[phone]  ? ?Subjective:  ? ?Patient ID:  Annette Johnston is a 76 y.o. (DOB 1945/11/05) female. ? ?Chief Complaint:  ?Chief Complaint  ?Patient presents with  ? Follow-up  ? Depression  ? Anxiety  ? Medication Problem  ? ? ?Anxiety ?Symptoms include decreased concentration. Patient reports no chest pain, confusion, dizziness, nervous/anxious behavior or suicidal ideas.  ? ? ?Depression ?       Associated symptoms include decreased concentration.  Associated symptoms include no suicidal ideas.  Past medical history includes anxiety.    ?Annette Johnston presents to the office today for follow-up of recent worsening TD after stopping [name] ? ?When seen October 23, 2018.  No med changes were made as of that date.  She had just gotten back on the [name] 120 mg a day which was helpful for her tardive dyskinesia  symptoms. ? ?At visit Dec 23, 2018.  No meds were changed.  She was spending too much time in bed and she was encouraged to get out of bed. ? ?When seen April 27, 2019.  Because of racing thoughts oxcarbazepine was increased to 300 mg in the morning and 600 mg at night.  It helped. ?She was having significant residual tardive dyskinesia symptoms on [name] at a high dosage but she did not want to change medicines at that time.  However since that time she called and wanted to make the change to Austedo.  There have been multiple phone calls since September 14. ? ?She called October 6 complaining that her jaw was locking up.  A trial of benztropine 0.5 mg twice daily was ineffective. ? ?She called October 12 stating the tardive dyskinesia symptoms were worse.  There was some concern that perhaps Trileptal was inducing metabolism of [name].  We started the switch from [name] to [place].  She was given lorazepam 0.5 mg 4 times daily for anxiety. ? ?As of October 21 she had increased Austedo to 12 mg twice daily and felt it was helpful. ? ?When seen June 16, 2019 she was complaining of nausea.  Zofran was not effective.  She was given Phenergan.  There was a question as to whether it was related to Austedo.  She was also encouraged to stop meloxicam. ? ?seen July 08, 2019.  The following changes were made: ?Increase carbidopa-levodopa to 2 in the morning and 1 in the afternoon and 1 in the evening for 4 days. ?  If tolerated but not helpful enough for balance then increase to 2 tablets 3 times daily. ?For anxiety add Ativan 1 tablet 3 times daily.  Doing better and less agitated with Ativan.  A lot of it was nerves.  Also less movement disorder with change in carbidopa-levodopa to 2 tablets 3 times daily.  Better balance.  Not in bed as much in the last 2 weeks.  I feel a big difference with the Ativan.  Less anxiety and panic. ? ?seen July 29, 2019.  No meds were changed.  Overall she was satisfied  with the meds at that time. ? ?seen September 29, 2019.  She was having some racing thoughts and oxcarbazepine 300 mg was increased to 1 in the morning and 2 at night.  She continued paroxetine 30 mg daily, lorazepam 0.5 mg 3 times daily to 4 times daily, Austedo 12 mg twice daily, Sinemet 25-100 mg 2 tablets twice daily. ? ?She called April 14 complaining of severe panic and was given extra lorazepam.  She claims compliance with medications.  She was scheduled for an urgent appointment the following day.   ? ?As of November 26, 2019 she reports the following. ?Increased anxiety and panic.  Felt SOB, dizzy and called ER bc choking.  Another one yesterday.  ER workup was OK.  Given 1 mg Ativan to ER.  That calmed me down.  Still SOB and constantly grunting and tongue twirling.  Mild slurring.  Came on suddenly on Tuesday.  Called neuro but couldn't get appt until May. ?Balance is poor. Plenty of sleep. Not grunting at night. ?She is not having manic symptoms ? Note she's been taking 1 of 30 mg paroxetine daily not 15 mg as indicated previously in the chart. ?Plan: For  anxiety increase oxcarbazepine to 1 in the morning and 2 at night of the 300 mg tablets. ?Anxiety is still bad.   ?Increase lorazepam 0.5 mg QID ?Continue Austedo 12 mg tablets, 1 twice daily.  ?Continue carbidopa levodopa 25-100 mg 2 tablets twice daily. ?Continue paroxetine  30 mg daily for anxiety. ? ?12/29/2019 appointment, the following is noted: ?Started grunting when gets agitated.  Fidgety. ?Had panic after accidentally stopping Austedo for a week.  Better anxiety back on her meds.  Worsening tongue movements and sucking lip.   ?Nausea resolved when split up meds.  ?Upset hasn't seen daughter in 3 years and hasn't called last 2 mother's days.   ?No mania and depression is manageable.  ?Sleep is fine. ?Plan increase Austedo to 12 mg twice daily. ?Continue oxcarbazepine 300 mg in the morning and 600 mg nightly and lorazepam 0.5 mg 4 times daily and  Sinemet 25-102 tablets twice daily, Continue paroxetine  30 mg daily for anxiety. ? ?02/02/2020 appointment with with the following noted: ?Knee surgery and done OK with it so far.  StartingPT. ?Doing really well with increase Austedo 18 BID.  Less slinging tongue and arms.  Less sucking lips.  Tolerated Austedo without a problem.   ?Mood is pretty good.  Still lay around a lot.   ?Thinks walking will get better gradually.   ?Anxiety is pretty good. Still fidgety but not that bothersome.   ? Not usually angry.Marland Kitchen   ?Tolerating all meds.   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 ?Plan: No med changes today ? ?09/07/2020 appointment with following noted: ?Still doesn't want to do anything.  [name] takes care of her.  Easily agitated.   ?  Worries over looking for another grant for Austedo bc costs $180/month and can't afford that. On Austedo 6+12 mg daily.  Will try changing to 9 mg 2 tablets BID and see if that's covered with 1 copayment which would make this more affordable. ?Plan: No med changes ? ?05/10/2021 appointment with the following noted: ?Bad panic couple mos ago and couldn't breathe.  Called 911. ?Took Ativan 0.5 mg prn.   ?More trouble with sleep initially. ?Doing nothing.  Weak  from even shower.  Sit in recliner all day. ?Get up 10 AM and to bed at 9.   ?Neuro sending her for further eval of movement disorder. ?$ stress.  Needs to sell land but H opposing it. ?Plan no med changes ? ?06/16/2021 phone call: She has low sodium down to 123 and PCP is wondering if it is her psych meds. ?MD response: Even though she has been on these medications a long time Re: psych meds, hyponatremia  is more likely related to paroxetine or oxcarbazepine than Austedo.  Sometimes these meds will cause low sodium levels even after many years of taking them medicines.  The only way to tell is to take her off of 1 at a time and see if the sodium level gets better.  She should stay in close contact with her  primary care doctor and make sure she follows their instructions.  She should ask them if she should take salt tablets.  However I think she needs to come off the paroxetine.  However cut the dose in half for 10 days an

## 2021-10-31 ENCOUNTER — Ambulatory Visit: Payer: Medicare HMO | Admitting: Psychiatry

## 2021-11-17 ENCOUNTER — Ambulatory Visit: Payer: 59 | Admitting: Gastroenterology

## 2021-11-25 ENCOUNTER — Encounter: Payer: Self-pay | Admitting: Gastroenterology
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
## 2021-11-27 ENCOUNTER — Encounter (HOSPITAL_COMMUNITY): Payer: Self-pay

## 2021-11-27 ENCOUNTER — Telehealth: Payer: Self-pay | Admitting: Gastroenterology

## 2021-11-27 ENCOUNTER — Other Ambulatory Visit: Payer: Self-pay

## 2021-11-27 ENCOUNTER — Encounter: Payer: Self-pay | Admitting: Gastroenterology

## 2021-11-27 ENCOUNTER — Emergency Department (HOSPITAL_COMMUNITY): Payer: Medicare HMO

## 2021-11-27 ENCOUNTER — Emergency Department (HOSPITAL_COMMUNITY)
Admission: EM | Admit: 2021-11-27 | Discharge: 2021-11-27 | Disposition: A | Discharge status: Home / Self Care | Payer: Medicare HMO | Attending: Emergency Medicine | Admitting: Emergency Medicine

## 2021-11-27 ENCOUNTER — Ambulatory Visit (INDEPENDENT_AMBULATORY_CARE_PROVIDER_SITE_OTHER): Payer: Medicare HMO | Admitting: Gastroenterology

## 2021-11-27 VITALS — BP 120/88 | HR 124 | Temp 97.5°F | Ht 67.0 in | Wt 202.8 lb

## 2021-11-27 DIAGNOSIS — R000 Tachycardia, unspecified: Secondary | ICD-10-CM | POA: Diagnosis not present

## 2021-11-27 DIAGNOSIS — R110 Nausea: Secondary | ICD-10-CM | POA: Insufficient documentation

## 2021-11-27 DIAGNOSIS — R112 Nausea with vomiting, unspecified: Secondary | ICD-10-CM

## 2021-11-27 DIAGNOSIS — R1310 Dysphagia, unspecified: Secondary | ICD-10-CM | POA: Diagnosis not present

## 2021-11-27 DIAGNOSIS — Z5321 Procedure and treatment not carried out due to patient leaving prior to being seen by health care provider: Secondary | ICD-10-CM | POA: Diagnosis not present

## 2021-11-27 DIAGNOSIS — G20 Parkinson's disease: Secondary | ICD-10-CM | POA: Diagnosis not present

## 2021-11-27 DIAGNOSIS — R1013 Epigastric pain: Secondary | ICD-10-CM | POA: Diagnosis not present

## 2021-11-27 DIAGNOSIS — R0602 Shortness of breath: Secondary | ICD-10-CM

## 2021-11-27 DIAGNOSIS — R42 Dizziness and giddiness: Secondary | ICD-10-CM

## 2021-11-27 DIAGNOSIS — Z86010 Personal history of colonic polyps: Secondary | ICD-10-CM

## 2021-11-27 LAB — TROPONIN I (HIGH SENSITIVITY)
Troponin I (High Sensitivity): 2 ng/L (ref <18)
Troponin I (High Sensitivity): <2 ng/L (ref <18)

## 2021-11-27 LAB — CBC
HCT: 40.2 % (ref 36.0–46.0)
Hemoglobin: 13.7 g/dL (ref 12.0–15.0)
MCH: 30.6 pg (ref 26.0–34.0)
MCHC: 34.1 g/dL (ref 30.0–36.0)
MCV: 89.7 fL (ref 80.0–100.0)
Platelets: 308 K/uL (ref 150–400)
RBC: 4.48 MIL/uL (ref 3.87–5.11)
RDW: 11.8 % (ref 11.5–15.5)
WBC: 7.2 K/uL (ref 4.0–10.5)
nRBC: 0 % (ref 0.0–0.2)

## 2021-11-27 LAB — BASIC METABOLIC PANEL WITH GFR
Anion gap: 10 (ref 5–15)
BUN: 11 mg/dL (ref 8–23)
CO2: 22 mmol/L (ref 22–32)
Calcium: 9.3 mg/dL (ref 8.9–10.3)
Chloride: 97 mmol/L — ABNORMAL LOW (ref 98–111)
Creatinine, Ser: 0.5 mg/dL (ref 0.44–1.00)
GFR, Estimated: >60 mL/min (ref >60)
Glucose, Bld: 97 mg/dL (ref 70–99)
Potassium: 4 mmol/L (ref 3.5–5.1)
Sodium: 129 mmol/L — ABNORMAL LOW (ref 135–145)

## 2021-11-27 NOTE — Patient Instructions (Signed)
Proceed to [facility] Emergency Room for evaluation of shortness of breath, lightheadedness, and fast heart rate.  ? ?We will see you back in a couple of weeks to re-group. ? ?Aliene Altes, PA-C ?[facility] ? ?

## 2021-11-27 NOTE — ED Triage Notes (Signed)
Patient sent from PCP due to elevated HR and patient states nausea. Patient has hx of tardive dyskinesia and parkinson.  ?

## 2021-11-27 NOTE — Telephone Encounter (Signed)
Please arrange follow-up in the next 2-3 weeks. We need to regroup on her GI issues due to patient needing ER evaluation at today's OV. This should be a regular in-person visit. Can be with me or other APP if I do not have an opening.  ?

## 2021-11-28 ENCOUNTER — Other Ambulatory Visit: Payer: Self-pay | Admitting: Psychiatry

## 2021-11-28 ENCOUNTER — Other Ambulatory Visit: Payer: Self-pay | Admitting: *Deleted

## 2021-11-28 ENCOUNTER — Encounter: Payer: Self-pay | Admitting: Internal Medicine

## 2021-11-28 ENCOUNTER — Telehealth: Payer: Self-pay | Admitting: Gastroenterology

## 2021-11-28 DIAGNOSIS — F4001 Agoraphobia with panic disorder: Secondary | ICD-10-CM

## 2021-11-28 DIAGNOSIS — F319 Bipolar disorder, unspecified: Secondary | ICD-10-CM

## 2021-11-28 DIAGNOSIS — G8929 Other chronic pain: Secondary | ICD-10-CM

## 2021-11-28 DIAGNOSIS — F411 Generalized anxiety disorder: Secondary | ICD-10-CM

## 2021-11-28 DIAGNOSIS — R1013 Epigastric pain: Secondary | ICD-10-CM

## 2021-11-28 MED ORDER — PANTOPRAZOLE SODIUM 40 MG PO TBEC
40.0000 mg | DELAYED_RELEASE_TABLET | Freq: Every day | ORAL | 3 refills | Status: DC
Start: 2021-11-28 — End: 2022-01-03

## 2021-11-28 NOTE — Telephone Encounter (Signed)
You may need to touch base with Dr. Edythe Lynn office and fax lab orders to them as patient was reaching out to their office today. ?

## 2021-11-28 NOTE — Addendum Note (Signed)
Addended by: Inda Castle on: 11/28/2021 02:33 PM ? ? Modules accepted: Orders ? ?

## 2021-11-28 NOTE — Telephone Encounter (Signed)
Noted. Labs entered into Epic. Mailed lab requisitions  ?

## 2021-11-28 NOTE — Addendum Note (Signed)
Addended by: Inda Castle on: 11/28/2021 02:40 PM ? ? Modules accepted: Orders ? ?

## 2021-11-28 NOTE — Telephone Encounter (Signed)
Reviewed ER evaluation.  Laboratory evaluation revealed sodium of 129, otherwise no significant abnormalities on BMP, CBC, or troponins.  Chest x-ray negative for acute abnormalities, evidence of chronic interstitial lung disease.  Unfortunately, patient left waiting room prior to being seen after waiting for about 7 hours. ? ?Spoke with patient today.  States after sitting in the emergency room for 7/2 hours, she started to feel better.  Her shortness of breath resolved and she decided to go home as they were told that there were no beds available and likely had another 9-hour wait.  She reports she continues to feel improved today but is still weak overall and her heart rate increases when walking from one room to another.  She is not having nausea or vomiting, but continues with mild epigastric pain. Eating and drinking ok. We discussed her hyponatremia.  She reports Dr. Quintin Alto has been dealing with this for several months as she has been in the low 130s.  She prefers to call Dr. Quintin Alto to discuss her hyponatremia further rather than proceeding to the emergency room for IV fluids she is advised if she is unable to reach Dr. Quintin Alto, she should proceed to the emergency room today. ? ?Regarding evaluation for epigastric pain, recommended checking LFTs and lipase as well as starting Protonix 40 mg daily which I will send to [facility] in [place] per patient's request.  Patient would like to have blood work completed at Dr. Edythe Lynn office. ? ?Courtney: Please arrange HFP and lipase to be completed at Dr. Edythe Lynn office.  DX: Epigastric pain. ?

## 2021-11-28 NOTE — Addendum Note (Signed)
Addended by: Inda Castle on: 11/28/2021 02:41 PM ? ? Modules accepted: Orders ? ?

## 2021-11-29 ENCOUNTER — Telehealth: Payer: Self-pay | Admitting: *Deleted

## 2021-11-29 NOTE — Telephone Encounter (Signed)
Great, thanks

## 2021-11-29 NOTE — Telephone Encounter (Signed)
Fax lab requisitions to [facility]. Pt has OV next week.  ?

## 2021-11-29 NOTE — Telephone Encounter (Signed)
Faxed lab requisitions to [facility].  ?

## 2021-11-30 ENCOUNTER — Encounter: Payer: Self-pay | Admitting: Psychiatry

## 2021-11-30 ENCOUNTER — Ambulatory Visit (INDEPENDENT_AMBULATORY_CARE_PROVIDER_SITE_OTHER): Payer: Medicare HMO | Admitting: Psychiatry

## 2021-11-30 DIAGNOSIS — F411 Generalized anxiety disorder: Secondary | ICD-10-CM

## 2021-11-30 DIAGNOSIS — F319 Bipolar disorder, unspecified: Secondary | ICD-10-CM | POA: Diagnosis not present

## 2021-11-30 DIAGNOSIS — F458 Other somatoform disorders: Secondary | ICD-10-CM

## 2021-11-30 DIAGNOSIS — G3184 Mild cognitive impairment, so stated: Secondary | ICD-10-CM

## 2021-11-30 DIAGNOSIS — F5105 Insomnia due to other mental disorder: Secondary | ICD-10-CM

## 2021-11-30 DIAGNOSIS — G20A1 Parkinson's disease without dyskinesia, without mention of fluctuations: Secondary | ICD-10-CM

## 2021-11-30 DIAGNOSIS — G2401 Drug induced subacute dyskinesia: Secondary | ICD-10-CM | POA: Diagnosis not present

## 2021-11-30 DIAGNOSIS — F4001 Agoraphobia with panic disorder: Secondary | ICD-10-CM

## 2021-11-30 DIAGNOSIS — G20 Parkinson's disease: Secondary | ICD-10-CM

## 2021-11-30 DIAGNOSIS — E871 Hypo-osmolality and hyponatremia: Secondary | ICD-10-CM

## 2021-11-30 NOTE — Progress Notes (Signed)
Annette Johnston ?[id number] ?[birth year] ?76 y.o.  ? ?Virtual Visit via Telephone Note ? ?I connected with pt by telephone and verified that I am speaking with the correct person using two identifiers. ?  ?I discussed the limitations, risks, security and privacy concerns of performing an evaluation and management service by telephone and the availability of in person appointments. I also discussed with the patient that there may be a patient responsible charge related to this service. The patient expressed understanding and agreed to proceed. ? ?I discussed the assessment and treatment plan with the patient. The patient was provided an opportunity to ask questions and all were answered. The patient agreed with the plan and demonstrated an understanding of the instructions. ?  ?The patient was advised to call back or seek an in-person evaluation if the symptoms worsen or if the condition fails to improve as anticipated. ? ?I provided 30 minutes of non-face-to-face time during this encounter. The patient was located at home and the provider was located office. ?[phone]  ? ?Subjective:  ? ?Patient ID:  Annette Johnston is a 76 y.o. (DOB 1945/10/25) female. ? ?Chief Complaint:  ?Chief Complaint  ?Patient presents with  ? Follow-up  ? Medication Reaction  ? Anxiety  ? ? ?Anxiety ?Symptoms include decreased concentration. Patient reports no confusion, dizziness, nervous/anxious behavior or suicidal ideas.  ? ? ?Depression ?       Associated symptoms include decreased concentration.  Associated symptoms include no suicidal ideas.  Past medical history includes anxiety.    ?Annette Johnston presents to the office today for follow-up of recent worsening TD after stopping [name] ? ?When seen October 23, 2018.  No med changes were made as of that date.  She had just gotten back on the [name] 120 mg a day which was helpful for her tardive dyskinesia symptoms. ? ?At visit Dec 23, 2018.  No meds were changed.  She was spending  too much time in bed and she was encouraged to get out of bed. ? ?When seen April 27, 2019.  Because of racing thoughts oxcarbazepine was increased to 300 mg in the morning and 600 mg at night.  It helped. ?She was having significant residual tardive dyskinesia symptoms on [name] at a high dosage but she did not want to change medicines at that time.  However since that time she called and wanted to make the change to Austedo.  There have been multiple phone calls since September 14. ? ?She called October 6 complaining that her jaw was locking up.  A trial of benztropine 0.5 mg twice daily was ineffective. ? ?She called October 12 stating the tardive dyskinesia symptoms were worse.  There was some concern that perhaps Trileptal was inducing metabolism of [name].  We started the switch from [name] to [place].  She was given lorazepam 0.5 mg 4 times daily for anxiety. ? ?As of October 21 she had increased Austedo to 12 mg twice daily and felt it was helpful. ? ?When seen June 16, 2019 she was complaining of nausea.  Zofran was not effective.  She was given Phenergan.  There was a question as to whether it was related to Austedo.  She was also encouraged to stop meloxicam. ? ?seen July 08, 2019.  The following changes were made: ?Increase carbidopa-levodopa to 2 in the morning and 1 in the afternoon and 1 in the evening for 4 days. ?If tolerated but not helpful enough for balance then increase to 2 tablets 3  times daily. ?For anxiety add Ativan 1 tablet 3 times daily.  Doing better and less agitated with Ativan.  A lot of it was nerves.  Also less movement disorder with change in carbidopa-levodopa to 2 tablets 3 times daily.  Better balance.  Not in bed as much in the last 2 weeks.  I feel a big difference with the Ativan.  Less anxiety and panic. ? ?seen July 29, 2019.  No meds were changed.  Overall she was satisfied with the meds at that time. ? ?seen September 29, 2019.  She was having some  racing thoughts and oxcarbazepine 300 mg was increased to 1 in the morning and 2 at night.  She continued paroxetine 30 mg daily, lorazepam 0.5 mg 3 times daily to 4 times daily, Austedo 12 mg twice daily, Sinemet 25-100 mg 2 tablets twice daily. ? ?She called April 14 complaining of severe panic and was given extra lorazepam.  She claims compliance with medications.  She was scheduled for an urgent appointment the following day.   ? ?As of November 26, 2019 she reports the following. ?Increased anxiety and panic.  Felt SOB, dizzy and called ER bc choking.  Another one yesterday.  ER workup was OK.  Given 1 mg Ativan to ER.  That calmed me down.  Still SOB and constantly grunting and tongue twirling.  Mild slurring.  Came on suddenly on Tuesday.  Called neuro but couldn't get appt until May. ?Balance is poor. Plenty of sleep. Not grunting at night. ?She is not having manic symptoms ? Note she's been taking 1 of 30 mg paroxetine daily not 15 mg as indicated previously in the chart. ?Plan: For  anxiety increase oxcarbazepine to 1 in the morning and 2 at night of the 300 mg tablets. ?Anxiety is still bad.   ?Increase lorazepam 0.5 mg QID ?Continue Austedo 12 mg tablets, 1 twice daily.  ?Continue carbidopa levodopa 25-100 mg 2 tablets twice daily. ?Continue paroxetine  30 mg daily for anxiety. ? ?12/29/2019 appointment, the following is noted: ?Started grunting when gets agitated.  Fidgety. ?Had panic after accidentally stopping Austedo for a week.  Better anxiety back on her meds.  Worsening tongue movements and sucking lip.   ?Nausea resolved when split up meds.  ?Upset hasn't seen daughter in 3 years and hasn't called last 2 mother's days.   ?No mania and depression is manageable.  ?Sleep is fine. ?Plan increase Austedo to 12 mg twice daily. ?Continue oxcarbazepine 300 mg in the morning and 600 mg nightly and lorazepam 0.5 mg 4 times daily and Sinemet 25-102 tablets twice daily, Continue paroxetine  30 mg daily for  anxiety. ? ?02/02/2020 appointment with with the following noted: ?Knee surgery and done OK with it so far.  StartingPT. ?Doing really well with increase Austedo 18 BID.  Less slinging tongue and arms.  Less sucking lips.  Tolerated Austedo without a problem.   ?Mood is pretty good.  Still lay around a lot.   ?Thinks walking will get better gradually.   ?Anxiety is pretty good. Still fidgety but not that bothersome.   ? Not usually angry.Marland Kitchen   ?Tolerating all meds.   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 ?Plan: No med changes today ? ?09/07/2020 appointment with following noted: ?Still doesn't want to do anything.  [name] takes care of her.  Easily agitated.   ?Worries over looking for another grant for Austedo bc costs $180/month and can't afford  that. On Austedo 6+12 mg daily.  Will try changing to 9 mg 2 tablets BID and see if that's covered with 1 copayment which would make this more affordable. ?Plan: No med changes ? ?05/10/2021 appointment with the following noted: ?Bad panic couple mos ago and couldn't breathe.  Called 911. ?Took Ativan 0.5 mg prn.   ?More trouble with sleep initially. ?Doing nothing.  Weak  from even shower.  Sit in recliner all day. ?Get up 10 AM and to bed at 9.   ?Neuro sending her for further eval of movement disorder. ?$ stress.  Needs to sell land but H opposing it. ?Plan no med changes ? ?06/16/2021 phone call: She has low sodium down to 123 and PCP is wondering if it is her psych meds. ?MD response: Even though she has been on these medications a long time Re: psych meds, hyponatremia  is more likely related to paroxetine or oxcarbazepine than Austedo.  Sometimes these meds will cause low sodium levels even after many years of taking them medicines.  The only way to tell is to take her off of 1 at a time and see if the sodium level gets better.  She should stay in close contact with her primary care doctor and make sure she follows their instructions.  She  should ask them if she should take salt tablets.  However I think she needs to come off the paroxetine.  However cut the dose in half for 10 days and then stop it.  If she gets real dizzy or spacey from stopping i

## 2021-12-08 NOTE — Telephone Encounter (Signed)
Can we follow-up on this early next week?  I have an appointment with her on 5/4 and would like to have lab results if she had them completed. ?

## 2021-12-11 ENCOUNTER — Telehealth: Payer: Self-pay | Admitting: *Deleted

## 2021-12-11 NOTE — Telephone Encounter (Signed)
Noted  

## 2021-12-11 NOTE — Telephone Encounter (Signed)
LMOM for pt to call office back 

## 2021-12-11 NOTE — Telephone Encounter (Signed)
Spoke to pt, she informed me that she had labs completed. I will call [facility] and have them fax the labs over.  ?

## 2021-12-13 ENCOUNTER — Other Ambulatory Visit: Payer: Self-pay | Admitting: Psychiatry

## 2021-12-13 DIAGNOSIS — F5105 Insomnia due to other mental disorder: Secondary | ICD-10-CM

## 2021-12-13 DIAGNOSIS — F411 Generalized anxiety disorder: Secondary | ICD-10-CM

## 2021-12-13 DIAGNOSIS — F4001 Agoraphobia with panic disorder: Secondary | ICD-10-CM
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
## 2021-12-14 ENCOUNTER — Encounter: Payer: Self-pay | Admitting: Gastroenterology

## 2021-12-14 ENCOUNTER — Ambulatory Visit (INDEPENDENT_AMBULATORY_CARE_PROVIDER_SITE_OTHER): Payer: Medicare HMO | Admitting: Gastroenterology

## 2021-12-14 VITALS — BP 140/74 | HR 100 | Temp 97.6°F | Ht 67.0 in | Wt 203.8 lb

## 2021-12-14 DIAGNOSIS — K580 Irritable bowel syndrome with diarrhea: Secondary | ICD-10-CM

## 2021-12-14 DIAGNOSIS — R1310 Dysphagia, unspecified: Secondary | ICD-10-CM

## 2021-12-14 DIAGNOSIS — R1013 Epigastric pain: Secondary | ICD-10-CM

## 2021-12-14 DIAGNOSIS — K219 Gastro-esophageal reflux disease without esophagitis: Secondary | ICD-10-CM | POA: Diagnosis not present

## 2021-12-14 DIAGNOSIS — Z86010 Personal history of colonic polyps: Secondary | ICD-10-CM | POA: Diagnosis not present

## 2021-12-14 NOTE — Patient Instructions (Signed)
We will arrange you to have a colonoscopy in the near future with Dr. Gala Romney. ? ?Continue Protonix 40 mg daily 30 minutes before breakfast. ? ?Follow a GERD diet:  ?Avoid fried, fatty, greasy, spicy, citrus foods. ?Avoid caffeine and carbonated beverages. ?Avoid chocolate. ?Try eating 4-6 small meals a day rather than 3 large meals. ?Do not eat within 3 hours of laying down. ?Prop head of bed up on wood or bricks to create a 6 inch incline. ? ?Continue Viberzi 100 mg twice daily. ? ?We will follow-up with you in about 3 months.  Do not hesitate to call if you have any questions or concerns prior to your next visit. ? ?It was great to see you again today!  I am glad you are feeling much better! ? ?Aliene Altes, PA-C ?[facility] ? ?

## 2021-12-19 ENCOUNTER — Ambulatory Visit: Payer: Medicare HMO | Admitting: Diagnostic Neuroimaging

## 2021-12-20 ENCOUNTER — Ambulatory Visit: Payer: Medicare HMO | Admitting: Diagnostic Neuroimaging

## 2021-12-22 ENCOUNTER — Telehealth: Payer: Self-pay | Admitting: *Deleted

## 2021-12-22 NOTE — Telephone Encounter (Signed)
LMOVM to call back to schedule TCS with Dr. Rourk, ASA 2 

## 2022-01-03 ENCOUNTER — Other Ambulatory Visit: Payer: Self-pay | Admitting: Gastroenterology

## 2022-01-03 DIAGNOSIS — R1013 Epigastric pain: Secondary | ICD-10-CM

## 2022-01-03 MED ORDER — PEG 3350-KCL-NA BICARB-NACL 420 G PO SOLR: ORAL | 0 refills | Status: DC

## 2022-01-03 NOTE — Telephone Encounter (Signed)
Patient returned call. She has been scheduled for 6/26 at 12:45pm. Aware will mail instructions and send prep rx to pharmacy.

## 2022-01-03 NOTE — Addendum Note (Signed)
Addended by: Cheron Every on: 01/03/2022 01:25 PM   Modules accepted: Orders

## 2022-01-09 ENCOUNTER — Other Ambulatory Visit: Payer: Self-pay | Admitting: Psychiatry

## 2022-01-09 DIAGNOSIS — F411 Generalized anxiety disorder: Secondary | ICD-10-CM

## 2022-01-09 DIAGNOSIS — F319 Bipolar disorder, unspecified: Secondary | ICD-10-CM

## 2022-01-09 DIAGNOSIS — F4001 Agoraphobia with panic disorder: Secondary | ICD-10-CM

## 2022-02-01 ENCOUNTER — Telehealth: Payer: Self-pay | Admitting: Internal Medicine

## 2022-02-01 ENCOUNTER — Telehealth: Payer: Self-pay | Admitting: *Deleted

## 2022-02-01 NOTE — Telephone Encounter (Signed)
LMOM for pt to call office back 

## 2022-02-01 NOTE — Telephone Encounter (Signed)
Noted. Message sent to endo to cancel.

## 2022-02-01 NOTE — Telephone Encounter (Signed)
Received VM from pt requesting to r/s her procedure.  Called pt, LMOVM to call back

## 2022-02-01 NOTE — Telephone Encounter (Signed)
Noted. Cologuard is not indicated for her with history of polyps. Is she saying she doesn't want to have another colonoscopy at all, or just not right now? I agree with holding off for now considering recent pneumonia. She has follow-up with me in August, and we can discuss scheduling a colonoscopy again at that time. However, if she is saying she doesn't want to have another colonoscopy ever, we could at least consider a cologuard. If cologuard is positive, she will still need a follow-up colonoscopy.   Due to her history, I would definitely encourage pursuing a colonoscopy in the future rather than cologuard.

## 2022-02-01 NOTE — Telephone Encounter (Signed)
Pt called to say she has been in the hospital with pneumonia and she doesn't feel like having the colonoscopy at this time. She wants to do the "Home Testing". She asked for [place] to cancel her procedure on 6/26 with Dr Gala Romney. [phone]

## 2022-02-01 NOTE — Telephone Encounter (Signed)
See prior note

## 2022-02-03 ENCOUNTER — Other Ambulatory Visit: Payer: Self-pay | Admitting: Gastroenterology

## 2022-02-05 ENCOUNTER — Ambulatory Visit (HOSPITAL_COMMUNITY): Admission: RE | Admit: 2022-02-05 | Payer: Medicare HMO | Source: Ambulatory Visit | Admitting: Internal Medicine

## 2022-02-05 ENCOUNTER — Encounter (HOSPITAL_COMMUNITY): Admission: RE | Payer: Self-pay | Source: Ambulatory Visit

## 2022-02-05 SURGERY — COLONOSCOPY WITH PROPOFOL
Anesthesia: Monitor Anesthesia Care

## 2022-03-02 ENCOUNTER — Other Ambulatory Visit: Payer: Self-pay

## 2022-03-02 ENCOUNTER — Telehealth: Payer: Self-pay | Admitting: Psychiatry

## 2022-03-02 DIAGNOSIS — F5105 Insomnia due to other mental disorder: Secondary | ICD-10-CM

## 2022-03-02 MED ORDER — LORAZEPAM 0.5 MG PO TABS: ORAL_TABLET | ORAL | 0 refills | Status: DC

## 2022-03-02 NOTE — Telephone Encounter (Signed)
Patient called in regarding her Lorazepam. States her pharmacy is out of stock and unsure of when they will have more. She has called several other pharmacies and they don't have it either. She has been out for a week and is having trouble sleeping the medicine also keeps her from having panic attacks. She would like CC to prescribe something else and a rtc ASAP to discuss options as she needs something before the weekend. Ph: [phone]

## 2022-03-02 NOTE — Telephone Encounter (Signed)
Pended.

## 2022-03-08 ENCOUNTER — Ambulatory Visit (INDEPENDENT_AMBULATORY_CARE_PROVIDER_SITE_OTHER): Payer: Medicare HMO | Admitting: Psychiatry

## 2022-03-08 ENCOUNTER — Encounter: Payer: Self-pay | Admitting: Psychiatry

## 2022-03-08 DIAGNOSIS — F411 Generalized anxiety disorder: Secondary | ICD-10-CM | POA: Diagnosis not present

## 2022-03-08 DIAGNOSIS — G3184 Mild cognitive impairment, so stated: Secondary | ICD-10-CM

## 2022-03-08 DIAGNOSIS — G20A1 Parkinson's disease without dyskinesia, without mention of fluctuations: Secondary | ICD-10-CM

## 2022-03-08 DIAGNOSIS — F5105 Insomnia due to other mental disorder: Secondary | ICD-10-CM

## 2022-03-08 DIAGNOSIS — G2401 Drug induced subacute dyskinesia: Secondary | ICD-10-CM

## 2022-03-08 DIAGNOSIS — F319 Bipolar disorder, unspecified: Secondary | ICD-10-CM | POA: Diagnosis not present

## 2022-03-08 DIAGNOSIS — F4001 Agoraphobia with panic disorder: Secondary | ICD-10-CM | POA: Diagnosis not present

## 2022-03-08 DIAGNOSIS — G20 Parkinson's disease: Secondary | ICD-10-CM

## 2022-03-08 DIAGNOSIS — E871 Hypo-osmolality and hyponatremia: Secondary | ICD-10-CM

## 2022-03-08 MED ORDER — LORAZEPAM 0.5 MG PO TABS: ORAL_TABLET | ORAL | 2 refills | Status: DC

## 2022-03-08 NOTE — Progress Notes (Signed)
Annette Johnston [id number] 14-Jun-1946 76 y.o.   Virtual Visit via Telephone Note  I connected with pt by telephone and verified that I am speaking with the correct person using two identifiers.   I discussed the limitations, risks, security and privacy concerns of performing an evaluation and management service by telephone and the availability of in person appointments. I also discussed with the patient that there may be a patient responsible charge related to this service. The patient expressed understanding and agreed to proceed.  I discussed the assessment and treatment plan with the patient. The patient was provided an opportunity to ask questions and all were answered. The patient agreed with the plan and demonstrated an understanding of the instructions.   The patient was advised to call back or seek an in-person evaluation if the symptoms worsen or if the condition fails to improve as anticipated.  I provided 30 minutes of non-face-to-face time during this encounter. The patient was located at home and the provider was located office. Session from [phone]  Subjective:   Patient ID:  Annette Johnston is a 76 y.o. (DOB 12-20-1945) female.  Chief Complaint:  Chief Complaint  Patient presents with   Follow-up   Bipolar I disorder    Tardive dyskinesia   Panic disorder with agoraphobia    Anxiety Symptoms include decreased concentration and nervous/anxious behavior. Patient reports no confusion, dizziness or suicidal ideas.    Depression        Associated symptoms include decreased concentration.  Associated symptoms include no suicidal ideas.  Past medical history includes anxiety.     Annette Johnston presents to the office today for follow-up of recent worsening TD after stopping [name]  When seen October 23, 2018.  No med changes were made as of that date.  She had just gotten back on the [name] 120 mg a day which was helpful for her tardive dyskinesia symptoms.  At visit  Dec 23, 2018.  No meds were changed.  She was spending too much time in bed and she was encouraged to get out of bed.  When seen April 27, 2019.  Because of racing thoughts oxcarbazepine was increased to 300 mg in the morning and 600 mg at night.  It helped. She was having significant residual tardive dyskinesia symptoms on [name] at a high dosage but she did not want to change medicines at that time.  However since that time she called and wanted to make the change to Austedo.  There have been multiple phone calls since September 14.  She called October 6 complaining that her jaw was locking up.  A trial of benztropine 0.5 mg twice daily was ineffective.  She called October 12 stating the tardive dyskinesia symptoms were worse.  There was some concern that perhaps Trileptal was inducing metabolism of [name].  We started the switch from [name] to [place].  She was given lorazepam 0.5 mg 4 times daily for anxiety.  As of October 21 she had increased Austedo to 12 mg twice daily and felt it was helpful.  When seen June 16, 2019 she was complaining of nausea.  Zofran was not effective.  She was given Phenergan.  There was a question as to whether it was related to Austedo.  She was also encouraged to stop meloxicam.  seen July 08, 2019.  The following changes were made: Increase carbidopa-levodopa to 2 in the morning and 1 in the afternoon and 1 in the evening for 4 days. If tolerated  but not helpful enough for balance then increase to 2 tablets 3 times daily. For anxiety add Ativan 1 tablet 3 times daily.  Doing better and less agitated with Ativan.  A lot of it was nerves.  Also less movement disorder with change in carbidopa-levodopa to 2 tablets 3 times daily.  Better balance.  Not in bed as much in the last 2 weeks.  I feel a big difference with the Ativan.  Less anxiety and panic.  seen July 29, 2019.  No meds were changed.  Overall she was satisfied with the meds at that  time.  seen September 29, 2019.  She was having some racing thoughts and oxcarbazepine 300 mg was increased to 1 in the morning and 2 at night.  She continued paroxetine 30 mg daily, lorazepam 0.5 mg 3 times daily to 4 times daily, Austedo 12 mg twice daily, Sinemet 25-100 mg 2 tablets twice daily.  She called April 14 complaining of severe panic and was given extra lorazepam.  She claims compliance with medications.  She was scheduled for an urgent appointment the following day.    As of November 26, 2019 she reports the following. Increased anxiety and panic.  Felt SOB, dizzy and called ER bc choking.  Another one yesterday.  ER workup was OK.  Given 1 mg Ativan to ER.  That calmed me down.  Still SOB and constantly grunting and tongue twirling.  Mild slurring.  Came on suddenly on Tuesday.  Called neuro but couldn't get appt until May. Balance is poor. Plenty of sleep. Not grunting at night. She is not having manic symptoms  Note she's been taking 1 of 30 mg paroxetine daily not 15 mg as indicated previously in the chart. Plan: For  anxiety increase oxcarbazepine to 1 in the morning and 2 at night of the 300 mg tablets. Anxiety is still bad.   Increase lorazepam 0.5 mg QID Continue Austedo 12 mg tablets, 1 twice daily.  Continue carbidopa levodopa 25-100 mg 2 tablets twice daily. Continue paroxetine  30 mg daily for anxiety.  12/29/2019 appointment, the following is noted: Started grunting when gets agitated.  Fidgety. Had panic after accidentally stopping Austedo for a week.  Better anxiety back on her meds.  Worsening tongue movements and sucking lip.   Nausea resolved when split up meds.  Upset hasn't seen daughter in 3 years and hasn't called last 2 mother's days.   No mania and depression is manageable.  Sleep is fine. Plan increase Austedo to 12 mg twice daily. Continue oxcarbazepine 300 mg in the morning and 600 mg nightly and lorazepam 0.5 mg 4 times daily and Sinemet 25-102 tablets  twice daily, Continue paroxetine  30 mg daily for anxiety.  02/02/2020 appointment with with the following noted: Knee surgery and done OK with it so far.  StartingPT. Doing really well with increase Austedo 18 BID.  Less slinging tongue and arms.  Less sucking lips.  Tolerated Austedo without a problem.   Mood is pretty good.  Still lay around a lot.   Thinks walking will get better gradually.   Anxiety is pretty good. Still fidgety but not that bothersome.    Not usually angry..   Tolerating all meds.   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 Plan: No med changes today  09/07/2020 appointment with following noted: Still doesn't want to do anything.  [name] takes care of her.  Easily agitated.   Worries over  looking for another grant for Austedo bc costs $180/month and can't afford that. On Austedo 6+12 mg daily.  Will try changing to 9 mg 2 tablets BID and see if that's covered with 1 copayment which would make this more affordable. Plan: No med changes  05/10/2021 appointment with the following noted: Bad panic couple mos ago and couldn't breathe.  Called 911. Took Ativan 0.5 mg prn.   More trouble with sleep initially. Doing nothing.  Weak  from even shower.  Sit in recliner all day. Get up 10 AM and to bed at 9.   Neuro sending her for further eval of movement disorder. $ stress.  Needs to sell land but H opposing it. Plan no med changes  06/16/2021 phone call: She has low sodium down to 123 and PCP is wondering if it is her psych meds. MD response: Even though she has been on these medications a long time Re: psych meds, hyponatremia  is more likely related to paroxetine or oxcarbazepine than Austedo.  Sometimes these meds will cause low sodium levels even after many years of taking them medicines.  The only way to tell is to take her off of 1 at a time and see if the sodium level gets better.  She should stay in close contact with her primary care doctor and  make sure she follows their instructions.  She should ask them if she should take salt tablets.  However I think she needs to come off the paroxetine.  However cut the dose in half for 10 days and then stop it.  If she gets real dizzy or spacey from stopping it let us know and we will taper it more slowly.  She also needs to get an appointment with me and get on the cancellation list.  06/28/2021 appointment with the following noted: Seen today after recent diagnosis of hyponatremia. FU PCP tomorrow.   Went to [facility] and neuro took her off Sinemet last week.  Hasn't noticed a change off it so far. She's been drinking Gatorade and sodium is better with last 129 up from 123. She didn't reduce the paroxetine as instructed bc "I was afraid to"  for fear of worsening anxiety and depression. "Ministroke" about a month ago causing bladder problems.   No mood swings.  Depressed and doesn't want to go out like she used to.  No interest or motivation.  08/29/2021 appt noted: DX PD by neuro [facility] and started Sinemet Still some depression and not getting out of recliner much.  Will start PT Stayed on oxcarbazepine DT sodium "better" per Dr. Quintin Alto. Weight gain bc was told to drink Gatorade for sodium.  10/27/21 appt noted: [facility] 09/19/21 and humerus fx.  Hasn't felt like riding in car.  Afraid of falls. Doing ok overall and nothing changed.   Regular doctor want to know about her meds.  Disc purpose of each med in detail. He told her to decrease oxcarbazepine to 1 twice daily but she doesn't know why and about 2 mos ago.  Maybe bc sodium level was low.  Sodium is better now. Taking Ativan '1mg'$  BID.  Anxiety about the same. Patient reports stable mood and denies depressed or irritable moods.  Patient denies any recent difficulty with anxiety.  Patient denies difficulty with sleep initiation or maintenance. Denies appetite disturbance.  Energy never very good.  Patient denies any difficulty with concentration.   Patient denies any suicidal ideation. No recent mania. Sodium 133. Plan: oxcarbazepine  1 in  the morning and 1 at night of the 300 mg tablets. Reduced apparently lorazepam 0.5 mg QID and continue paroxetine 30 mg daily for anxiety. Her TD was very difficult to control.  Mild facial sx. Fidgety. Continue Austedo to 18 mg BID bc sig residual TD. She tolerated it and it helped tongue facial TD.  11/30/2021 appointment with the following noted: Arm not healed. Not doing good.  # stressors.   Shaking bad bc hasn't taken meds.  Normally shakiness under control.   Continued weakness and not able to go anywhere except to doctor.   Mental health issues are OK.  Using cane or walker.   Doing PT for 2 mos but running out DT insurance. Low sodium lately.  129 on 11/27/21 No mood swings.    03/08/22 appt noted: Increased lorazepam 1 mg HS and did better for awhile but trouble sleeping again.   So much on my mind trouble going to sleep.  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  Anxious dealing with renter.  A lot of stress. Can't do without the Ativan  only thing that saves me.  2-3 panic in last 3 mos.  Went to ER with panic and got panic and it stopped the last 2 panic.  She doesn't drive anymore. Taking Ativan 0.5 mg AM and 1 mg at 8 pm and to bed 9 pm.  Anxious and just wants to lay down. Under so much stress. No anger outbursts. Consistent with meds. Anxiety much worse with stress. Not falling.  Past Psychiatric Medication Trials: Depakote jerks, lithium tremor, Equetro good response, oxcarbazepine 600 partial response, risperidone side effects, Seroquel 300,  Latuda, [name], Saphris paroxetine 30 for panic,   sertraline,  [name] partial response Austedo 18 BID Lorazepam 0.5 3 times daily with good response  remote history of ECT,  Review of Systems:  Review of Systems  Musculoskeletal:  Positive for arthralgias, gait problem and joint  swelling. Negative for back pain.  Neurological:  Positive for tremors and weakness. Negative for dizziness.       Poor balance  Psychiatric/Behavioral:  Positive for decreased concentration, depression, dysphoric mood and sleep disturbance. Negative for agitation, behavioral problems, confusion, hallucinations, self-injury and suicidal ideas. The patient is nervous/anxious. The patient is not hyperactive.     Medications: I have reviewed the patient's current medications.  Current Outpatient Medications  Medication Sig Dispense Refill   aspirin 81 MG EC tablet Take 81 mg by mouth daily.     carbidopa-levodopa (SINEMET IR) 25-100 MG tablet Take 2 tablets by mouth 3 (three) times daily.     Cholecalciferol (VITAMIN D) 50 MCG (2000 UT) CAPS Take 4,000 Units by mouth daily.     Deutetrabenazine (AUSTEDO) 9 MG TABS TAKE 2 TABLETS (18 MG)  BY MOUTH IN THE MORNING AND 2 TABLETS (18 MG) AT BEDTIME. 120 tablet 11   diphenhydramine-acetaminophen (TYLENOL PM) 25-500 MG TABS tablet Take 2 tablets by mouth at bedtime.     levothyroxine (SYNTHROID, LEVOTHROID) 137 MCG tablet Take 137 mcg by mouth daily before breakfast.     Oxcarbazepine (TRILEPTAL) 300 MG tablet TAKE 1 TABLET BY MOUTH EVERY MORNING AND 2 TABLETS AT NIGHT (Patient taking differently: Take 300 mg by mouth 2 (two) times daily.) 270 tablet 0   PARoxetine (PAXIL) 30 MG tablet TAKE 1 TABLET BY MOUTH EVERY DAY 90 tablet 0   polyethylene glycol-electrolytes (NULYTELY) 420 g solution As directed 4000 mL 0   promethazine (PHENERGAN) 25  MG tablet Take 1 tablet (25 mg total) by mouth every 8 (eight) hours as needed for nausea or vomiting. 30 tablet 6   VIBERZI 100 MG TABS TAKE 1 TABLET BY MOUTH TWICE A DAY WITH MEALS 60 tablet 5   LORazepam (ATIVAN) 0.5 MG tablet 1 tablet in AM, 6 PM, and 2 at bedtime.  May take 1 more daily if needed for panic (Patient taking differently: She is taking 1 in the morning and 2 at night and sometimes an extra for panic)  140 tablet 2   No current facility-administered medications for this visit.    Medication Side Effects: None  Allergies:  Allergies  Allergen Reactions   Latex Rash    Bandaids    Past Medical History:  Diagnosis Date   Anxiety    Bipolar 1 disorder ([place])    Depression    Dyspnea on exertion july 2011   Normal stress test dec 2010. Reportedl normal CXR 2011. CT abdomen lung cut 2005  - normal. Normal PFT - march 2012   Hypothyroidism    Obesity (BMI 30-39.9)    BMI 36 Feb 2012   [name])    Urticaria     Family History  Problem Relation Age of Onset   Alzheimer's disease Mother    Heart attack Father    Coronary artery disease Brother    Heart attack Brother    Alzheimer's disease Paternal Grandmother    Allergic rhinitis Neg Hx    Angioedema Neg Hx    Asthma Neg Hx    Atopy Neg Hx    Eczema Neg Hx    Immunodeficiency Neg Hx    Urticaria Neg Hx    Colon cancer Neg Hx     Social History   Socioeconomic History   Marital status: Married    Spouse name: [name]   Number of children: 1   Years of education: Not on file   Highest education level: High school graduate  Occupational History   Occupation: retired  Tobacco Use   Smoking status: Never   Smokeless tobacco: Never  Vaping Use   Vaping Use: Never used  Substance and Sexual Activity   Alcohol use: Not Currently   Drug use: No   Sexual activity: Yes    Birth control/protection: Surgical  Other Topics Concern   Not on file  Social History Narrative   Caffeine 1 cup per day    Lives at home with husband    Right Handed.    Social Determinants of Health   Financial Resource Strain: Not on file  Food Insecurity: Not on file  Transportation Needs: Not on file  Physical Activity: Not on file  Stress: Not on file  Social Connections: Not on file  Intimate Partner Violence: Not on file    Past Medical History, Surgical history, Social history, and Family history were reviewed and  updated as appropriate.   Please see review of systems for further details on the patient's review from today.   Objective:   Physical Exam:  There were no vitals taken for this visit.  Physical Exam Constitutional:      General: She is not in acute distress.    Appearance: She is well-developed.  Musculoskeletal:        General: No deformity.  Neurological:     Mental Status: She is alert and oriented to person, place, and time.     Coordination: Coordination normal.  Psychiatric:  Attention and Perception: Attention and perception normal.        Mood and Affect: Mood is anxious. Mood is not depressed. Affect is not labile, blunt, angry or inappropriate.        Speech: Speech normal.        Behavior: Behavior normal.        Thought Content: Thought content normal. Thought content is not delusional. Thought content does not include homicidal or suicidal ideation. Thought content does not include suicidal plan.        Judgment: Judgment normal.     Comments: Fair insight chronically      Lab Review:     Component Value Date/Time   NA 129 (L) 11/27/2021 1441   NA 137 12/17/2017 1056   K 4.0 11/27/2021 1441   CL 97 (L) 11/27/2021 1441   CO2 22 11/27/2021 1441   GLUCOSE 97 11/27/2021 1441   BUN 11 11/27/2021 1441   BUN 15 12/17/2017 1056   CREATININE 0.50 11/27/2021 1441   CALCIUM 9.3 11/27/2021 1441   PROT 7.3 12/17/2017 1056   ALBUMIN 4.2 12/17/2017 1056   AST 30 12/17/2017 1056   ALT 26 12/17/2017 1056   ALKPHOS 77 12/17/2017 1056   BILITOT <0.2 12/17/2017 1056   GFRNONAA >60 11/27/2021 1441   GFRAA 104 12/17/2017 1056       Component Value Date/Time   WBC 7.2 11/27/2021 1441   RBC 4.48 11/27/2021 1441   HGB 13.7 11/27/2021 1441   HGB 13.1 12/17/2017 1056   HCT 40.2 11/27/2021 1441   HCT 38.2 12/17/2017 1056   PLT 308 11/27/2021 1441   PLT 329 12/17/2017 1056   MCV 89.7 11/27/2021 1441   MCV 90 12/17/2017 1056   MCH 30.6 11/27/2021 1441   MCHC  34.1 11/27/2021 1441   RDW 11.8 11/27/2021 1441   RDW 14.0 12/17/2017 1056   LYMPHSABS 1.6 12/17/2017 1056   MONOABS 0.5 06/06/2016 0959   EOSABS 0.3 12/17/2017 1056   BASOSABS 0.0 12/17/2017 1056    No results found for: "POCLITH", "LITHIUM"   No results found for: "PHENYTOIN", "PHENOBARB", "VALPROATE", "CBMZ"   .res Assessment: Plan:    Bipolar I disorder ([place])  Panic disorder with agoraphobia  Insomnia due to mental condition - Plan: LORazepam (ATIVAN) 0.5 MG tablet  Generalized anxiety disorder  Mild cognitive impairment  Tardive dyskinesia  Parkinson's disease (HCC)  Hyponatremia   Deconditioned  Greater than 50% of 30 min face to face time with patient was spent on counseling and coordination of care. We discussed Ms. Zavada is a very complicated and difficult to treat bipolar patient with tardive dyskinesia.  It is complicated by the presence as well of mild cognitive impairment which appears stable..  In addition complicated by multiple med failures and very limited options for mood stabilization.  The option that we are using still has a risk of drug drug interactions with the medication for tardive dyskinesia.  Complaining of depressed without motivation, energy.  But so far does not notice any worsening manic symptoms since reducing oxcarbazepine to 300 mg twice daily.  We discussed the risk of this happening.  If she has to stop oxcarbazepine, then that we will leave no option but to return to an atypical antipsychotic mood stabilizer which we are attempting to try to avoid because of her tardive dyskinesia  Re; hyponatremia: Even though she has been on these medications a long time Re: psych meds, hyponatremia  is more likely related to paroxetine or  oxcarbazepine than Austedo.  She is afraid of stopping paroxetine because she is found it helpful for her anxiety and her anxiety is still not controlled.  We cannot stop oxcarbazepine without finding an alternative  mood stabilizer. She is having her sodium rechecked tomorrow by Dr. Quintin Alto.  If it is low then we will start [place] which is an antipsychotic with low EPS and low tardive dyskinesia risk.  It also tends to be helpful for bipolar depression.   She never stopped oxcarbazepine bc Dr. Quintin Alto said sodium was better.  But apparently he told her to decrease the oxcarbazepine to 300 mg twice daily because of the low sodium.  This was apparently done around January 2023.  The labs that Dr. Quintin Alto is doing are not visible within epic but she reports her last level was better per Dr. Quintin Alto We will leave the dose at the lower level and discussed the risk of mania. Option add salt tablet twice daily.  Her tardive dyskinesia symptoms are   improved on 18 mg Austedo BID.  But not resolved. Brain scan and neurology [facility] DX PD Jan 2023 and restarted Sinemet without benefit yet  oxcarbazepine  1 in the morning and 1 at night of the 300 mg tablets. Reduced apparently Alternative Caplyta bc low EPS/TD risk but expensive and not certain it will be covered.  lorazepam 0.5 mg BID and 1 mg HS  and 1 extra if needed for panic in hopes of keeping her out of ER for panic continue paroxetine 30 mg daily for anxiety but this is not managing her anxiety as hoped.  Consider increase but risk mood cycling.  Her TD was very difficult to control.  Mild facial sx. Fidgety.  Continue Austedo to 18 mg BID bc sig residual TD. She tolerated it and it helped tongue facial TD. Disc risk increased EPS.  Spending too much time in bed.  Needs to stay out of bed more.  Inactivity is causing the weakness.  Needs more exercise and rec walking.  Starting therapy for her arm and knee..Deconditioned and needs exercise to correct.  She fears falling.  This is no better and not doing housework.  We discussed the short-term risks associated with benzodiazepines including sedation and increased fall risk among others.  Discussed long-term  side effect risk including dependence, potential withdrawal symptoms, and the potential eventual dose-related risk of dementia.  But recent studies from 2020 dispute this association between benzodiazepines and dementia risk. Newer studies in 2020 do not support an association with dementia.  Consider Caplyta, lamotrigine for mood. She's reluctant to start Brand meds.  Hesitant to add meds DT low sodium and fall risk DT deconditioned but consider bc psych sx not managed as well as we would like.  $ px limit use of branded meds.  Bruxism stopped.  FU 44-month CLynder Parents MD, DFAPA  Please see After Visit Summary for patient specific instructions.  Future Appointments  Date Time Provider DMarion 03/22/2022  3:00 PM HErenest Rasher PA-C [facility]GA RGA    No orders of the defined types were placed in this encounter.      -------------------------------
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
## 2022-03-22 ENCOUNTER — Encounter: Payer: Self-pay | Admitting: Gastroenterology

## 2022-03-22 ENCOUNTER — Ambulatory Visit (INDEPENDENT_AMBULATORY_CARE_PROVIDER_SITE_OTHER): Payer: Medicare HMO | Admitting: Gastroenterology

## 2022-03-22 VITALS — BP 141/76 | HR 110 | Temp 97.9°F | Ht 67.0 in | Wt 202.6 lb

## 2022-03-22 DIAGNOSIS — Z86010 Personal history of colonic polyps: Secondary | ICD-10-CM | POA: Diagnosis not present

## 2022-03-22 DIAGNOSIS — K580 Irritable bowel syndrome with diarrhea: Secondary | ICD-10-CM

## 2022-03-22 NOTE — Patient Instructions (Signed)
Please complete Cologuard test.  Let me know if you have not received the test in the mail in the next couple of weeks.  Continue Viberzi twice daily.  We will follow-up with you in 6 months.  Do not hesitate to call if you have any questions or concerns prior to your next visit.  It was good to see you again today!  Aliene Altes, PA-C [facility]

## 2022-03-26 ENCOUNTER — Other Ambulatory Visit: Payer: Self-pay | Admitting: Psychiatry

## 2022-03-26 DIAGNOSIS — F5105 Insomnia due to other mental disorder: Secondary | ICD-10-CM

## 2022-04-07 ENCOUNTER — Other Ambulatory Visit: Payer: Self-pay | Admitting: Psychiatry

## 2022-04-07 DIAGNOSIS — F319 Bipolar disorder, unspecified: Secondary | ICD-10-CM

## 2022-04-07 DIAGNOSIS — F411 Generalized anxiety disorder: Secondary | ICD-10-CM

## 2022-04-07 DIAGNOSIS — F4001 Agoraphobia with panic disorder: Secondary | ICD-10-CM

## 2022-04-10 NOTE — Telephone Encounter (Signed)
Dr. Casimiro Needle last note states patient take 1 QAM and 1 QPM.  Verified this with patient.

## 2022-05-04 LAB — COLOGUARD: COLOGUARD: NEGATIVE

## 2022-05-17 ENCOUNTER — Other Ambulatory Visit: Payer: Self-pay | Admitting: Psychiatry

## 2022-05-17 DIAGNOSIS — F4001 Agoraphobia with panic disorder: Secondary | ICD-10-CM

## 2022-05-17 DIAGNOSIS — F411 Generalized anxiety disorder: Secondary | ICD-10-CM

## 2022-05-28 ENCOUNTER — Ambulatory Visit (INDEPENDENT_AMBULATORY_CARE_PROVIDER_SITE_OTHER): Payer: Medicare HMO | Admitting: Psychiatry

## 2022-05-28 ENCOUNTER — Encounter: Payer: Self-pay | Admitting: Psychiatry

## 2022-05-28 DIAGNOSIS — F4001 Agoraphobia with panic disorder: Secondary | ICD-10-CM | POA: Diagnosis not present

## 2022-05-28 DIAGNOSIS — F5105 Insomnia due to other mental disorder: Secondary | ICD-10-CM | POA: Diagnosis not present

## 2022-05-28 DIAGNOSIS — G3184 Mild cognitive impairment, so stated: Secondary | ICD-10-CM

## 2022-05-28 DIAGNOSIS — F319 Bipolar disorder, unspecified: Secondary | ICD-10-CM | POA: Diagnosis not present

## 2022-05-28 DIAGNOSIS — F411 Generalized anxiety disorder: Secondary | ICD-10-CM | POA: Diagnosis not present

## 2022-05-28 DIAGNOSIS — E871 Hypo-osmolality and hyponatremia: Secondary | ICD-10-CM

## 2022-05-28 DIAGNOSIS — G2401 Drug induced subacute dyskinesia: Secondary | ICD-10-CM

## 2022-05-28 MED ORDER — LORAZEPAM 0.5 MG PO TABS: ORAL_TABLET | ORAL | 1 refills | Status: DC

## 2022-05-28 MED ORDER — PAROXETINE HCL 30 MG PO TABS: 30.0000 mg | ORAL_TABLET | Freq: Every day | ORAL | 2 refills | Status: DC

## 2022-05-28 NOTE — Patient Instructions (Signed)
Stop oxcarbazepine bc of low sodium  Start Lybalvi (which has olanzapine in it) 5 mg tablet 1 in the evening 2-3 hours before bedtime to help sleep, anxiety, and it is a mood stabilizer

## 2022-05-28 NOTE — Progress Notes (Signed)
Annette Johnston [id number] May 26, 1946 76 y.o.    Subjective:   Patient ID:  Annette Johnston is a 76 y.o. (DOB 1945/10/26) female.  Chief Complaint:  Chief Complaint  Patient presents with   Follow-up    Bipolar I disorder ([place])   Anxiety   Sleeping Problem   Depression    Anxiety Symptoms include decreased concentration and nervous/anxious behavior. Patient reports no confusion, dizziness or suicidal ideas.    Depression        Associated symptoms include decreased concentration.  Associated symptoms include no suicidal ideas.  Past medical history includes anxiety.     Annette Johnston presents to the office today for follow-up of recent worsening TD after stopping [name]  When seen October 23, 2018.  No med changes were made as of that date.  She had just gotten back on the [name] 120 mg a day which was helpful for her tardive dyskinesia symptoms.  At visit Dec 23, 2018.  No meds were changed.  She was spending too much time in bed and she was encouraged to get out of bed.  When seen April 27, 2019.  Because of racing thoughts oxcarbazepine was increased to 300 mg in the morning and 600 mg at night.  It helped. She was having significant residual tardive dyskinesia symptoms on [name] at a high dosage but she did not want to change medicines at that time.  However since that time she called and wanted to make the change to Austedo.  There have been multiple phone calls since September 14.  She called October 6 complaining that her jaw was locking up.  A trial of benztropine 0.5 mg twice daily was ineffective.  She called October 12 stating the tardive dyskinesia symptoms were worse.  There was some concern that perhaps Trileptal was inducing metabolism of [name].  We started the switch from [name] to [place].  She was given lorazepam 0.5 mg 4 times daily for anxiety.  As of October 21 she had increased Austedo to 12 mg twice daily and felt it was helpful.  When seen  June 16, 2019 she was complaining of nausea.  Zofran was not effective.  She was given Phenergan.  There was a question as to whether it was related to Austedo.  She was also encouraged to stop meloxicam.  seen July 08, 2019.  The following changes were made: Increase carbidopa-levodopa to 2 in the morning and 1 in the afternoon and 1 in the evening for 4 days. If tolerated but not helpful enough for balance then increase to 2 tablets 3 times daily. For anxiety add Ativan 1 tablet 3 times daily.  Doing better and less agitated with Ativan.  A lot of it was nerves.  Also less movement disorder with change in carbidopa-levodopa to 2 tablets 3 times daily.  Better balance.  Not in bed as much in the last 2 weeks.  I feel a big difference with the Ativan.  Less anxiety and panic.  seen July 29, 2019.  No meds were changed.  Overall she was satisfied with the meds at that time.  seen September 29, 2019.  She was having some racing thoughts and oxcarbazepine 300 mg was increased to 1 in the morning and 2 at night.  She continued paroxetine 30 mg daily, lorazepam 0.5 mg 3 times daily to 4 times daily, Austedo 12 mg twice daily, Sinemet 25-100 mg 2 tablets twice daily.  She called April 14 complaining of severe panic  and was given extra lorazepam.  She claims compliance with medications.  She was scheduled for an urgent appointment the following day.    As of November 26, 2019 she reports the following. Increased anxiety and panic.  Felt SOB, dizzy and called ER bc choking.  Another one yesterday.  ER workup was OK.  Given 1 mg Ativan to ER.  That calmed me down.  Still SOB and constantly grunting and tongue twirling.  Mild slurring.  Came on suddenly on Tuesday.  Called neuro but couldn't get appt until May. Balance is poor. Plenty of sleep. Not grunting at night. She is not having manic symptoms  Note she's been taking 1 of 30 mg paroxetine daily not 15 mg as indicated previously in the  chart. Plan: For  anxiety increase oxcarbazepine to 1 in the morning and 2 at night of the 300 mg tablets. Anxiety is still bad.   Increase lorazepam 0.5 mg QID Continue Austedo 12 mg tablets, 1 twice daily.  Continue carbidopa levodopa 25-100 mg 2 tablets twice daily. Continue paroxetine  30 mg daily for anxiety.  12/29/2019 appointment, the following is noted: Started grunting when gets agitated.  Fidgety. Had panic after accidentally stopping Austedo for a week.  Better anxiety back on her meds.  Worsening tongue movements and sucking lip.   Nausea resolved when split up meds.  Upset hasn't seen daughter in 3 years and hasn't called last 2 mother's days.   No mania and depression is manageable.  Sleep is fine. Plan increase Austedo to 12 mg twice daily. Continue oxcarbazepine 300 mg in the morning and 600 mg nightly and lorazepam 0.5 mg 4 times daily and Sinemet 25-102 tablets twice daily, Continue paroxetine  30 mg daily for anxiety.  02/02/2020 appointment with with the following noted: Knee surgery and done OK with it so far.  StartingPT. Doing really well with increase Austedo 18 BID.  Less slinging tongue and arms.  Less sucking lips.  Tolerated Austedo without a problem.   Mood is pretty good.  Still lay around a lot.   Thinks walking will get better gradually.   Anxiety is pretty good. Still fidgety but not that bothersome.    Not usually angry..   Tolerating all meds.   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 Plan: No med changes today  09/07/2020 appointment with following noted: Still doesn't want to do anything.  [name] takes care of her.  Easily agitated.   Worries over looking for another grant for Austedo bc costs $180/month and can't afford that. On Austedo 6+12 mg daily.  Will try changing to 9 mg 2 tablets BID and see if that's covered with 1 copayment which would make this more affordable. Plan: No med changes  05/10/2021 appointment with  the following noted: Bad panic couple mos ago and couldn't breathe.  Called 911. Took Ativan 0.5 mg prn.   More trouble with sleep initially. Doing nothing.  Weak  from even shower.  Sit in recliner all day. Get up 10 AM and to bed at 9.   Neuro sending her for further eval of movement disorder. $ stress.  Needs to sell land but H opposing it. Plan no med changes  06/16/2021 phone call: She has low sodium down to 123 and PCP is wondering if it is her psych meds. MD response: Even though she has been on these medications a long time Re: psych meds, hyponatremia  is more likely  related to paroxetine or oxcarbazepine than Austedo.  Sometimes these meds will cause low sodium levels even after many years of taking them medicines.  The only way to tell is to take her off of 1 at a time and see if the sodium level gets better.  She should stay in close contact with her primary care doctor and make sure she follows their instructions.  She should ask them if she should take salt tablets.  However I think she needs to come off the paroxetine.  However cut the dose in half for 10 days and then stop it.  If she gets real dizzy or spacey from stopping it let us know and we will taper it more slowly.  She also needs to get an appointment with me and get on the cancellation list.  06/28/2021 appointment with the following noted: Seen today after recent diagnosis of hyponatremia. FU PCP tomorrow.   Went to [facility] and neuro took her off Sinemet last week.  Hasn't noticed a change off it so far. She's been drinking Gatorade and sodium is better with last 129 up from 123. She didn't reduce the paroxetine as instructed bc "I was afraid to"  for fear of worsening anxiety and depression. "Ministroke" about a month ago causing bladder problems.   No mood swings.  Depressed and doesn't want to go out like she used to.  No interest or motivation.  08/29/2021 appt noted: DX PD by neuro [facility] and started  Sinemet Still some depression and not getting out of recliner much.  Will start PT Stayed on oxcarbazepine DT sodium "better" per Dr. Quintin Alto. Weight gain bc was told to drink Gatorade for sodium.  10/27/21 appt noted: [place] 09/19/21 and humerus fx.  Hasn't felt like riding in car.  Afraid of falls. Doing ok overall and nothing changed.   Regular doctor want to know about her meds.  Disc purpose of each med in detail. He told her to decrease oxcarbazepine to 1 twice daily but she doesn't know why and about 2 mos ago.  Maybe bc sodium level was low.  Sodium is better now. Taking Ativan '1mg'$  BID.  Anxiety about the same. Patient reports stable mood and denies depressed or irritable moods.  Patient denies any recent difficulty with anxiety.  Patient denies difficulty with sleep initiation or maintenance. Denies appetite disturbance.  Energy never very good.  Patient denies any difficulty with concentration.  Patient denies any suicidal ideation. No recent mania. Sodium 133. Plan: oxcarbazepine  1 in the morning and 1 at night of the 300 mg tablets. Reduced apparently lorazepam 0.5 mg QID and continue paroxetine 30 mg daily for anxiety. Her TD was very difficult to control.  Mild facial sx. Fidgety. Continue Austedo to 18 mg BID bc sig residual TD. She tolerated it and it helped tongue facial TD.  11/30/2021 appointment with the following noted: Arm not healed. Not doing good.  # stressors.   Shaking bad bc hasn't taken meds.  Normally shakiness under control.   Continued weakness and not able to go anywhere except to doctor.   Mental health issues are OK.  Using cane or walker.   Doing PT for 2 mos but running out DT insurance. Low sodium lately.  129 on 11/27/21 No mood swings.    03/08/22 appt noted: Increased lorazepam 1 mg HS and did better for awhile but trouble sleeping again.   So much on my mind trouble going to sleep.  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  Anxious dealing with renter.  A lot of stress. Can't do without the Ativan  only thing that saves me.  2-3 panic in last 3 mos.  Went to ER with panic and got panic and it stopped the last 2 panic.  She doesn't drive anymore. Taking Ativan 0.5 mg AM and 1 mg at 8 pm and to bed 9 pm.  Anxious and just wants to lay down. Under so much stress. No anger outbursts. Consistent with meds. Anxiety much worse with stress. Not falling.  05/28/22 appt noted: Not driving much.  Drove to church yesterday then got weak and shakey.  Just started back to church.  But gets nervous in a crowd.   Spending 12 hours daily in bed.  Sleeps 10 - 10.   Taking lorazepam 0.5 mg 4 times daily for anxiety and panic.  Can's sleep without it. Panic is triggered.  Chronic $ worries.  Needs to sell property but H can be oppositional about that.   Not bad irritable. No SE TD sx are better and not shaking as much . Not always depressed but sometimes.   H's main concerns are her walking and her anxiety.  Says she worries too much. 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 She thinks she reduced oxcarbazepine to 300 mg daily. Still on paroxetine 30 mg daily for panic.   Past Psychiatric Medication Trials: Depakote jerks, lithium tremor, Equetro good response, oxcarbazepine 600 partial response,  risperidone side effects, Seroquel 300,  Latuda, [name], Saphris  paroxetine 30 for panic,   sertraline,  [name] partial response Austedo 18 BID Lorazepam 0.5 3 times daily with good response  remote history of ECT,  Review of Systems:  Review of Systems  Musculoskeletal:  Positive for arthralgias, gait problem and joint swelling. Negative for back pain.  Neurological:  Positive for tremors and weakness. Negative for dizziness.       Poor balance  Psychiatric/Behavioral:  Positive for decreased concentration and dysphoric mood. Negative for agitation, behavioral problems, confusion, hallucinations, self-injury, sleep  disturbance and suicidal ideas. The patient is nervous/anxious. The patient is not hyperactive.     Medications: I have reviewed the patient's current medications.  Current Outpatient Medications  Medication Sig Dispense Refill   aspirin 81 MG EC tablet Take 81 mg by mouth daily.     carbidopa-levodopa (SINEMET IR) 25-100 MG tablet Take 2 tablets by mouth 3 (three) times daily.     Cholecalciferol (VITAMIN D) 50 MCG (2000 UT) CAPS Take 4,000 Units by mouth daily.     Deutetrabenazine (AUSTEDO) 9 MG TABS TAKE 2 TABLETS (18 MG)  BY MOUTH IN THE MORNING AND 2 TABLETS (18 MG) AT BEDTIME. 120 tablet 11   diphenhydramine-acetaminophen (TYLENOL PM) 25-500 MG TABS tablet Take 2 tablets by mouth at bedtime.     levothyroxine (SYNTHROID, LEVOTHROID) 137 MCG tablet Take 137 mcg by mouth daily before breakfast.     VIBERZI 100 MG TABS TAKE 1 TABLET BY MOUTH TWICE A DAY WITH MEALS 60 tablet 5   LORazepam (ATIVAN) 0.5 MG tablet 1 tablet twice daily and 2 at night. Can take 1 extra if needed for panic 130 tablet 1   PARoxetine (PAXIL) 30 MG tablet Take 1 tablet (30 mg total) by mouth daily. 30 tablet 2   No current facility-administered medications for this visit.    Medication Side Effects: None  Allergies:  Allergies  Allergen Reactions   Latex Rash    Bandaids  Past Medical History:  Diagnosis Date   Anxiety    Bipolar 1 disorder ([place])    Depression    Dyspnea on exertion july 2011   Normal stress test dec 2010. Reportedl normal CXR 2011. CT abdomen lung cut 2005  - normal. Normal PFT - march 2012   Hypothyroidism    Obesity (BMI 30-39.9)    BMI 36 Feb 2012   [name]    Urticaria     Family History  Problem Relation Age of Onset   Alzheimer's disease Mother    Heart attack Father    Coronary artery disease Brother    Heart attack Brother    Alzheimer's disease Paternal Grandmother    Allergic rhinitis Neg Hx    Angioedema Neg Hx    Asthma Neg Hx    Atopy Neg Hx     Eczema Neg Hx    Immunodeficiency Neg Hx    Urticaria Neg Hx    Colon cancer Neg Hx     Social History   Socioeconomic History   Marital status: Married    Spouse name: [name]   Number of children: 1   Years of education: Not on file   Highest education level: High school graduate  Occupational History   Occupation: retired  Tobacco Use   Smoking status: Never   Smokeless tobacco: Never  Vaping Use   Vaping Use: Never used  Substance and Sexual Activity   Alcohol use: Not Currently   Drug use: No   Sexual activity: Yes    Birth control/protection: Surgical  Other Topics Concern   Not on file  Social History Narrative   Caffeine 1 cup per day    Lives at home with husband    Right Handed.    Social Determinants of Health   Financial Resource Strain: Not on file  Food Insecurity: Not on file  Transportation Needs: Not on file  Physical Activity: Not on file  Stress: Not on file  Social Connections: Not on file  Intimate Partner Violence: Not on file    Past Medical History, Surgical history, Social history, and Family history were reviewed and updated as appropriate.   Please see review of systems for further details on the patient's review from today.   Objective:   Physical Exam:  There were no vitals taken for this visit.  Physical Exam Constitutional:      General: She is not in acute distress.    Appearance: She is well-developed.  Musculoskeletal:        General: No deformity.  Neurological:     Mental Status: She is alert and oriented to person, place, and time.     Coordination: Coordination normal.     Comments: Fidgety with legs is better.  Minimal other TD sx.  Psychiatric:        Attention and Perception: Attention and perception normal.        Mood and Affect: Mood is anxious. Mood is not depressed. Affect is not labile, blunt, angry or inappropriate.        Speech: Speech normal.        Behavior: Behavior normal.        Thought Content:  Thought content normal. Thought content is not delusional. Thought content does not include homicidal or suicidal ideation. Thought content does not include suicidal plan.        Judgment: Judgment normal.     Comments: Fair insight chronically      Lab Review:  Component Value Date/Time   NA 129 (L) 11/27/2021 1441   NA 137 12/17/2017 1056   K 4.0 11/27/2021 1441   CL 97 (L) 11/27/2021 1441   CO2 22 11/27/2021 1441   GLUCOSE 97 11/27/2021 1441   BUN 11 11/27/2021 1441   BUN 15 12/17/2017 1056   CREATININE 0.50 11/27/2021 1441   CALCIUM 9.3 11/27/2021 1441   PROT 7.3 12/17/2017 1056   ALBUMIN 4.2 12/17/2017 1056   AST 30 12/17/2017 1056   ALT 26 12/17/2017 1056   ALKPHOS 77 12/17/2017 1056   BILITOT <0.2 12/17/2017 1056   GFRNONAA >60 11/27/2021 1441   GFRAA 104 12/17/2017 1056       Component Value Date/Time   WBC 7.2 11/27/2021 1441   RBC 4.48 11/27/2021 1441   HGB 13.7 11/27/2021 1441   HGB 13.1 12/17/2017 1056   HCT 40.2 11/27/2021 1441   HCT 38.2 12/17/2017 1056   PLT 308 11/27/2021 1441   PLT 329 12/17/2017 1056   MCV 89.7 11/27/2021 1441   MCV 90 12/17/2017 1056   MCH 30.6 11/27/2021 1441   MCHC 34.1 11/27/2021 1441   RDW 11.8 11/27/2021 1441   RDW 14.0 12/17/2017 1056   LYMPHSABS 1.6 12/17/2017 1056   MONOABS 0.5 06/06/2016 0959   EOSABS 0.3 12/17/2017 1056   BASOSABS 0.0 12/17/2017 1056    No results found for: "POCLITH", "LITHIUM"   No results found for: "PHENYTOIN", "PHENOBARB", "VALPROATE", "CBMZ"   Component 04/06/22 01/10/22 11/27/20 11/24/19 03/02/19 10/04/18  Sodium 129 Low  130 Low  134 Low  135 Low  129 Low   134 Low     .res Assessment: Plan:    Bipolar I disorder with depression ([place])  Panic disorder with agoraphobia - Plan: PARoxetine (PAXIL) 30 MG tablet  Generalized anxiety disorder - Plan: PARoxetine (PAXIL) 30 MG tablet  Insomnia due to mental condition - Plan: LORazepam (ATIVAN) 0.5 MG tablet  Mild cognitive  impairment  Tardive dyskinesia  Hyponatremia   Deconditioned  Greater than 50% of 30 min face to face time with patient was spent on counseling and coordination of care. We discussed Ms. Knudsen is a very complicated and difficult to treat bipolar patient with tardive dyskinesia.  It is complicated by the presence as well of mild cognitive impairment which appears stable..  In addition complicated by multiple med failures and very limited options for mood stabilization.  The option that we are using still has a risk of drug drug interactions with the medication for tardive dyskinesia.  Not manic  Re; hyponatremia: Even though she has been on these medications a long time Re: psych meds, hyponatremia  is more likely related to paroxetine or oxcarbazepine than Austedo.  She is afraid of stopping paroxetine because she is found it helpful for her anxiety and her anxiety is still not controlled.   DC oxcarbazapine DT low sodium and start Lybalvi 5 mg as alternative mood stabilizer which should also be much better with anxiety.  Don't have samples of low dose Caplyta which is another option.  When this runs out will switch to olanzapine for cost reasons.  No sig options other than atypical.  Will use low dose in hopes of not worsening movement disorder.  Her tardive dyskinesia symptoms are   improved on 18 mg Austedo BID.  Now mostly resolved. resolved. Brain scan and neurology [facility] DX PD Jan 2023 and restarted Sinemet without benefit yet  lorazepam 0.5 mg BID and 1 mg HS  and  1 extra if needed for panic in hopes of keeping her out of ER for panic continue paroxetine 30 mg daily for anxiety but this is not managing her anxiety as hoped.  Consider increase but risk mood cycling but is not currently or recently manic  Her TD were very difficult to control.  Mild facial sx resolved.   Fidgety is better.  Continue Austedo to 18 mg BID bc sig residual TD. She tolerated it and it helped tongue facial  TD. Disc risk increased EPS.  Spending too much time in bed.  Needs to stay out of bed more.  Inactivity is causing the weakness.  Needs more exercise and rec walking.  Starting therapy for her arm and knee..Deconditioned and needs exercise to correct.  She fears falling.  This is no better and not doing housework.  We discussed the short-term risks associated with benzodiazepines including sedation and increased fall risk among others.  Discussed long-term side effect risk including dependence, potential withdrawal symptoms, and the potential eventual dose-related risk of dementia.  But recent studies from 2020 dispute this association between benzodiazepines and dementia risk. Newer studies in 2020 do not support an association with dementia.  Consider Caplyta, lamotrigine for mood. She's reluctant to start Brand meds.  Bruxism stopped.  FU 72-month CLynder Parents MD, DFAPA  Please see After Visit Summary for patient specific instructions.  No future appointments.   No orders of the defined types were placed in this encounter.      -------------------------------

## 2022-05-30 ENCOUNTER — Telehealth: Payer: Self-pay | Admitting: Psychiatry

## 2022-05-30 ENCOUNTER — Other Ambulatory Visit: Payer: Self-pay

## 2022-05-30 DIAGNOSIS — F5105 Insomnia due to other mental disorder: Secondary | ICD-10-CM

## 2022-05-30 NOTE — Telephone Encounter (Signed)
Patient lvm stating she is having issues with her Lorazepam. Please call.  # [phone]

## 2022-05-30 NOTE — Telephone Encounter (Signed)
Pharmacy was out of stock so she just needed it somewhere else.Pended

## 2022-06-01 ENCOUNTER — Telehealth: Payer: Self-pay

## 2022-06-01 NOTE — Telephone Encounter (Signed)
Patient was seen Monday, 10/16. The below changes were made.  Patient said she is shaking so much she can't hold a cup of coffee. She gets to sleep ok, but wakes up about 1 AM and not able to get back to sleep. She said she woke up this morning and her clothes were wet, but she was cold when she went to bed and had on a heavy blanket and not sure if that contributed to this. She said she is yelling at her husband. She said she didn't start the new regimen until Wednesday, so it has only been 2 days with the medication adjustment.   Stop oxcarbazepine bc of low sodium   Start Lybalvi (which has olanzapine in it) 5 mg tablet 1 in the evening 2-3 hours before bedtime to help sleep, anxiety, and it is a mood stabilizer

## 2022-06-01 NOTE — Telephone Encounter (Signed)
I suspect part of what she is experiencing is withdrawal from oxcarbazepine.  At her appointment a couple of days ago she told me she thought she had reduced oxcarbazepine 300 mg tablets from 2 daily to 1 daily.  Ask if she has any way of confirming how much oxcarbazepine she was taking.  Have her look at her bottle.  If she went from 2 daily to 0 that could certainly cause this problem. However if she was taking 1 oxcarbazepine daily as she indicated at her appointment then have her resume 1/2 tablet of oxcarbazepine at night for 2 weeks while taking the Lybalvi and then try to stop it again.  If resuming some of the oxcarbazepine does not reduce the severity of her symptoms then she needs to call us back and let us know. Remind her I am trying to get her off the oxcarbazepine because of her low sodium problem.

## 2022-06-01 NOTE — Telephone Encounter (Signed)
Patient notified

## 2022-06-01 NOTE — Telephone Encounter (Signed)
LVM to RC 

## 2022-06-04 NOTE — Telephone Encounter (Signed)
About Tuesday 06/05/22, pls call pt and see if her sx of tremor and anxiety and irritability and night sweats are better.

## 2022-06-05 NOTE — Telephone Encounter (Signed)
LVM to RC 

## 2022-06-05 NOTE — Telephone Encounter (Signed)
Patient said "this is the first day that I felt like myself." She is using her cane, but tremors better, she is sleeping good, irritability and night sweats are better, no mania.

## 2022-06-07 ENCOUNTER — Telehealth: Payer: Self-pay

## 2022-06-07 ENCOUNTER — Other Ambulatory Visit: Payer: Self-pay | Admitting: Psychiatry

## 2022-06-07 DIAGNOSIS — F5105 Insomnia due to other mental disorder: Secondary | ICD-10-CM

## 2022-06-07 MED ORDER — LYBALVI 5-10 MG PO TABS: 1.0000 | ORAL_TABLET | Freq: Every evening | ORAL | 0 refills | Status: DC

## 2022-06-07 NOTE — Telephone Encounter (Signed)
Prior Approval received with [facility] [employer] [employer] for LORAZEPAM 0.5 MG #130 through 08/12/2022  Prior Approval received for LYBALVI 5-10 MG through 08/12/2022

## 2022-06-07 NOTE — Telephone Encounter (Signed)
[facility] said [facility] not [facility] and to send Rx to [facility]. I see that Annette Johnston pended Rx to [facility]. [facility] to [facility] and they said they had the medication, but that insurance would only pay for 4 a day and she potentially would be taking 5 a day prn. Tried to submit a PA through Cover My Meds and their system is down. Called patient to let her know, as she was very anxious. She said she had called the pharmacy and they told her that 130 tablets was too much for her age and that she was addicted. She agreed that she was, but said she is anxious and has to have it. The pharmacy is going to fill Rx for 120 tomorrow and patient is okay with that.

## 2022-06-07 NOTE — Telephone Encounter (Signed)
Prior Authorization for LORAZEPAM 0.5 MG #130 with [facility] [employer] [phone]

## 2022-07-03 ENCOUNTER — Encounter (HOSPITAL_COMMUNITY)
Admission: RE | Admit: 2022-07-03 | Discharge: 2022-07-03 | Disposition: A | Discharge status: Home / Self Care | Payer: Medicare HMO | Source: Ambulatory Visit | Attending: Ophthalmology | Admitting: Ophthalmology

## 2022-07-03 ENCOUNTER — Encounter (HOSPITAL_COMMUNITY): Payer: Self-pay

## 2022-07-03 NOTE — H&P (Signed)
Surgical History & Physical  Patient Name: Annette Johnston DOB: Nov 16, 1945  Surgery: Cataract extraction with intraocular lens implant phacoemulsification; Left Eye  Surgeon: Baruch Goldmann MD Surgery Date:  07-09-22 Pre-Op Date:  06-21-22  HPI: [name] presents for cataract re-eval Pt referred by Dr. Hassell Done last year. Pt states she had pneumonia and broken shoulder in the past year. The patient complains of difficulty when viewing TV, reading closed caption, news scrolls on TV, which began 3 years ago. Both eyes are affected. The episode is gradual. The condition's severity worsened in the last year.. The complaint is associated with blurry vision and halos. Symptoms are negatively affecting pt's quality of life. Pt usinf ATs, c/o watering eyes. HPI was performed by Baruch Goldmann .  Medical History: Dry Eyes Cataracts Epiphora h/o Benign orbital tumor OS Macula Degeneration Glaucoma Anxiety disorder, Bipolar Disorder Parkinson's d... High Blood Pressure Thyroid Problems  Review of Systems Negative Allergic/Immunologic Negative Cardiovascular Negative Constitutional Negative Ear, Nose, Mouth & Throat Negative Endocrine Negative Eyes Negative Gastrointestinal Negative Genitourinary Negative Hemotologic/Lymphatic Negative Integumentary Negative Musculoskeletal Negative Neurological Negative Psychiatry Negative Respiratory  Social   Never smoked   Medication Artificial Tears,  Benzonatate, Carbidopa, Levothyroxine Sodium, Lorazepam, Paroxetine, Viberzi,   Sx/Procedures Orbital tumor removed, Lasik-Myopic,  Breast implant, Arm repair sx, Hysterectomy,   Drug Allergies   NKDA  History & Physical: Heent: Cataract, left eye NECK: supple without bruits LUNGS: lungs clear to auscultation CV: regular rate and rhythm Abdomen: soft and non-tender Impression & Plan: Assessment: 1.  COMBINED FORMS AGE RELATED CATARACT; Both Eyes (H25.813) 2.   BLEPHARITIS; Right Upper Lid, Right Lower Lid, Left Upper Lid, Left Lower Lid (H01.001, H01.002,H01.004,H01.005) 3.  DERMATOCHALASIS; Right Upper Lid, Left Upper Lid (H02.831, H02.834) 4.  Dry Eye Syndrome; Both Eyes (H04.123)  Plan: 1.  Cataract accounts for the patient's decreased vision. This visual impairment is not correctable with a tolerable change in glasses or contact lenses. Cataract surgery with an implantation of a new lens should significantly improve the visual and functional status of the patient. Discussed all risks, benefits, alternatives, and potential complications. Discussed the procedures and recovery. Patient desires to have surgery. A-scan ordered and performed today for intra-ocular lens calculations. The surgery will be performed in order to improve vision for driving, reading, and for eye examinations. Recommend phacoemulsification with intra-ocular lens. Recommend Dextenza for post-operative pain and inflammation. Left Eye worse - first. Dilates poorly - shugacaine by protocol. Omidira. Malyugin ring in room. S/P Laser Vision Correction - Myopic LASIK - Barrett True K  2.  Recommend regular lid cleaning.  3.  Asymptomatic, recommend observation for now. Findings, prognosis and treatment options reviewed.  4.  Start Xiidra 1 drop every 12 hours both eyes.

## 2022-07-09 ENCOUNTER — Ambulatory Visit (HOSPITAL_COMMUNITY)
Admission: RE | Admit: 2022-07-09 | Discharge: 2022-07-09 | Disposition: A | Discharge status: Home / Self Care | Payer: Medicare HMO | Attending: Ophthalmology | Admitting: Ophthalmology

## 2022-07-09 ENCOUNTER — Ambulatory Visit (HOSPITAL_COMMUNITY): Payer: Medicare HMO | Admitting: Anesthesiology

## 2022-07-09 ENCOUNTER — Encounter (HOSPITAL_COMMUNITY)
Admission: RE | Disposition: A | Discharge status: Home / Self Care | Payer: Self-pay | Source: Home / Self Care | Attending: Ophthalmology

## 2022-07-09 ENCOUNTER — Encounter (HOSPITAL_COMMUNITY): Payer: Self-pay | Admitting: Ophthalmology

## 2022-07-09 ENCOUNTER — Ambulatory Visit (HOSPITAL_BASED_OUTPATIENT_CLINIC_OR_DEPARTMENT_OTHER): Payer: Medicare HMO | Admitting: Anesthesiology

## 2022-07-09 DIAGNOSIS — H02834 Dermatochalasis of left upper eyelid: Secondary | ICD-10-CM | POA: Insufficient documentation

## 2022-07-09 DIAGNOSIS — G20A1 Parkinson's disease without dyskinesia, without mention of fluctuations: Secondary | ICD-10-CM | POA: Insufficient documentation

## 2022-07-09 DIAGNOSIS — F319 Bipolar disorder, unspecified: Secondary | ICD-10-CM | POA: Diagnosis not present

## 2022-07-09 DIAGNOSIS — H25812 Combined forms of age-related cataract, left eye: Secondary | ICD-10-CM | POA: Diagnosis not present

## 2022-07-09 DIAGNOSIS — E039 Hypothyroidism, unspecified: Secondary | ICD-10-CM | POA: Insufficient documentation

## 2022-07-09 DIAGNOSIS — H02831 Dermatochalasis of right upper eyelid: Secondary | ICD-10-CM | POA: Diagnosis not present

## 2022-07-09 DIAGNOSIS — F418 Other specified anxiety disorders: Secondary | ICD-10-CM

## 2022-07-09 DIAGNOSIS — H0100A Unspecified blepharitis right eye, upper and lower eyelids: Secondary | ICD-10-CM | POA: Diagnosis not present

## 2022-07-09 DIAGNOSIS — H0100B Unspecified blepharitis left eye, upper and lower eyelids: Secondary | ICD-10-CM | POA: Insufficient documentation

## 2022-07-09 DIAGNOSIS — H04123 Dry eye syndrome of bilateral lacrimal glands: Secondary | ICD-10-CM | POA: Insufficient documentation

## 2022-07-09 HISTORY — PX: CATARACT EXTRACTION W/PHACO: SHX586

## 2022-07-09 SURGERY — PHACOEMULSIFICATION, CATARACT, WITH IOL INSERTION
Anesthesia: Monitor Anesthesia Care | Site: Eye | Laterality: Left

## 2022-07-09 MED ORDER — BSS IO SOLN
INTRAOCULAR | Status: DC | PRN
  Administered 2022-07-09: 15 mL via INTRAOCULAR

## 2022-07-09 MED ORDER — LIDOCAINE HCL (PF) 1 % IJ SOLN
INTRAOCULAR | Status: DC | PRN
  Administered 2022-07-09: 1 mL via OPHTHALMIC

## 2022-07-09 MED ORDER — SODIUM HYALURONATE 23MG/ML IO SOSY
PREFILLED_SYRINGE | INTRAOCULAR | Status: DC | PRN
  Administered 2022-07-09: .6 mL via INTRAOCULAR

## 2022-07-09 MED ORDER — MOXIFLOXACIN HCL 5 MG/ML IO SOLN
INTRAOCULAR | Status: AC
  Filled 2022-07-09: qty 1

## 2022-07-09 MED ORDER — TROPICAMIDE 1 % OP SOLN
1.0000 [drp] | OPHTHALMIC | Status: AC | PRN
  Administered 2022-07-09 (×3): 1 [drp] via OPHTHALMIC
  Filled 2022-07-09: qty 3

## 2022-07-09 MED ORDER — SODIUM HYALURONATE 10 MG/ML IO SOLUTION
PREFILLED_SYRINGE | INTRAOCULAR | Status: DC | PRN
  Administered 2022-07-09: .85 mL via INTRAOCULAR

## 2022-07-09 MED ORDER — EPINEPHRINE PF 1 MG/ML IJ SOLN
INTRAOCULAR | Status: DC | PRN
  Administered 2022-07-09: 500 mL

## 2022-07-09 MED ORDER — MIDAZOLAM HCL 2 MG/2ML IJ SOLN
INTRAMUSCULAR | Status: AC
  Filled 2022-07-09: qty 2

## 2022-07-09 MED ORDER — MOXIFLOXACIN HCL 0.5 % OP SOLN
OPHTHALMIC | Status: DC | PRN
  Administered 2022-07-09: .3 mL via OPHTHALMIC

## 2022-07-09 MED ORDER — POVIDONE-IODINE 5 % OP SOLN
OPHTHALMIC | Status: DC | PRN
  Administered 2022-07-09: 1 via OPHTHALMIC

## 2022-07-09 MED ORDER — TETRACAINE HCL 0.5 % OP SOLN
1.0000 [drp] | OPHTHALMIC | Status: AC | PRN
  Administered 2022-07-09 (×3): 1 [drp] via OPHTHALMIC

## 2022-07-09 MED ORDER — EPINEPHRINE PF 1 MG/ML IJ SOLN
INTRAMUSCULAR | Status: AC
  Filled 2022-07-09: qty 2

## 2022-07-09 MED ORDER — PHENYLEPHRINE HCL 2.5 % OP SOLN
1.0000 [drp] | OPHTHALMIC | Status: AC | PRN
  Administered 2022-07-09 (×3): 1 [drp] via OPHTHALMIC

## 2022-07-09 MED ORDER — LIDOCAINE HCL 3.5 % OP GEL
1.0000 | Freq: Once | OPHTHALMIC | Status: AC
  Administered 2022-07-09: 1 via OPHTHALMIC

## 2022-07-09 MED ORDER — STERILE WATER FOR IRRIGATION IR SOLN
Status: DC | PRN
  Administered 2022-07-09: 25 mL

## 2022-07-09 MED ORDER — MIDAZOLAM HCL 5 MG/5ML IJ SOLN
INTRAMUSCULAR | Status: DC | PRN
  Administered 2022-07-09 (×2): 1 mg via INTRAVENOUS

## 2022-07-09 SURGICAL SUPPLY — 14 items
CATARACT SUITE SIGHTPATH (MISCELLANEOUS) ×1 IMPLANT
CLOTH BEACON ORANGE TIMEOUT ST (SAFETY) ×1 IMPLANT
EYE SHIELD UNIVERSAL CLEAR (GAUZE/BANDAGES/DRESSINGS) IMPLANT
FEE CATARACT SUITE SIGHTPATH (MISCELLANEOUS) ×1 IMPLANT
GLOVE BIOGEL PI IND STRL 7.0 (GLOVE) ×2 IMPLANT
GLOVE SS BIOGEL STRL SZ 6.5 (GLOVE) IMPLANT
LENS IOL RAYNER 18.5 (Intraocular Lens) ×1 IMPLANT
LENS IOL RAYONE EMV 18.5 (Intraocular Lens) IMPLANT
NDL HYPO 18GX1.5 BLUNT FILL (NEEDLE) ×1 IMPLANT
NEEDLE HYPO 18GX1.5 BLUNT FILL (NEEDLE) ×1 IMPLANT
PAD ARMBOARD 7.5X6 YLW CONV (MISCELLANEOUS) ×1 IMPLANT
SYR TB 1ML LL NO SAFETY (SYRINGE) ×1 IMPLANT
TAPE PAPER 2X10 WHT MICROPORE (GAUZE/BANDAGES/DRESSINGS) IMPLANT
WATER STERILE IRR 250ML POUR (IV SOLUTION) ×1 IMPLANT

## 2022-07-09 NOTE — Op Note (Signed)
Date of procedure: 07/09/22  Pre-operative diagnosis: Visually significant age-related combined cataract, Left Eye (H25.812)  Post-operative diagnosis: Visually significant age-related combined cataract, Left Eye (H25.812)  Procedure: Removal of cataract via phacoemulsification and insertion of intra-ocular lens Rayner [id number] +18.5D into the capsular bag of the Left Eye  Attending surgeon: Gerda Diss. Arda Daggs, MD, MA  Anesthesia: MAC, Topical Akten  Complications: None  Estimated Blood Loss: <54m (minimal)  Specimens: None  Implants: As above  Indications:  Visually significant age-related cataract, Left Eye  Procedure:  The patient was seen and identified in the pre-operative area. The operative eye was identified and dilated.  The operative eye was marked.  Topical anesthesia was administered to the operative eye.     The patient was then to the operative suite and placed in the supine position.  A timeout was performed confirming the patient, procedure to be performed, and all other relevant information.   The patient's face was prepped and draped in the usual fashion for intra-ocular surgery.  A lid speculum was placed into the operative eye and the surgical microscope moved into place and focused.  An inferotemporal paracentesis was created using a 20 gauge paracentesis blade.  Shugarcaine was injected into the anterior chamber.  Viscoelastic was injected into the anterior chamber.  A temporal clear-corneal main wound incision was created using a 2.472mmicrokeratome.  A continuous curvilinear capsulorrhexis was initiated using an irrigating cystitome and completed using capsulorrhexis forceps.  Hydrodissection and hydrodeliniation were performed.  Viscoelastic was injected into the anterior chamber.  A phacoemulsification handpiece and a chopper as a second instrument were used to remove the nucleus and epinucleus. The irrigation/aspiration handpiece was used to remove any remaining  cortical material.   The capsular bag was reinflated with viscoelastic, checked, and found to be intact.  The intraocular lens was inserted into the capsular bag.  The irrigation/aspiration handpiece was used to remove any remaining viscoelastic.  The clear corneal wound and paracentesis wounds were then hydrated and checked with Weck-Cels to be watertight. 0.66m56mf moxifloxacin was injected into the anterior chamber. The lid-speculum was removed.  The drape was removed.  The patient's face was cleaned with a wet and dry 4x4.    A clear shield was taped over the eye. The patient was taken to the post-operative care unit in good condition, having tolerated the procedure well.  Post-Op Instructions: The patient will follow [facility] at [facility] a same day post-operative evaluation and will receive all other orders and instructions.

## 2022-07-09 NOTE — Discharge Instructions (Signed)
Please discharge patient when stable, will follow up today with Dr. Nyheim Seufert at the [facility] [place] office immediately following discharge.  Leave shield in place until visit.  All paperwork with discharge instructions will be given at the office.  [facility] Address:  [address]  [place], [place] [address]  

## 2022-07-09 NOTE — Interval H&P Note (Signed)
History and Physical Interval Note:  07/09/2022 1:39 PM  [name]  has presented today for surgery, with the diagnosis of combined forms age related cataract; left.  The various methods of treatment have been discussed with the patient and family. After consideration of risks, benefits and other options for treatment, the patient has consented to  Procedure(s) with comments: CATARACT EXTRACTION PHACO AND INTRAOCULAR LENS PLACEMENT (IOC) (Left) - CDE: as a surgical intervention.  The patient's history has been reviewed, patient examined, no change in status, stable for surgery.  I have reviewed the patient's chart and labs.  Questions were answered to the patient's satisfaction.     Baruch Goldmann

## 2022-07-09 NOTE — Anesthesia Preprocedure Evaluation (Addendum)
Anesthesia Evaluation  Patient identified by MRN, date of birth, ID band Patient awake    Reviewed: Allergy & Precautions, H&P , NPO status , Patient's Chart, lab work & pertinent test results  Airway Mallampati: II  TM Distance: >3 FB Neck ROM: Full    Dental  (+) Dental Advisory Given, Missing   Pulmonary shortness of breath and with exertion   Pulmonary exam normal breath sounds clear to auscultation       Cardiovascular negative cardio ROS Normal cardiovascular exam Rhythm:Regular Rate:Normal     Neuro/Psych  Headaches PSYCHIATRIC DISORDERS Anxiety Depression Bipolar Disorder    Neuromuscular disease (parkinsonism)    GI/Hepatic Neg liver ROS,GERD  Medicated and Controlled,,  Endo/Other  Hypothyroidism    Renal/GU negative Renal ROS  negative genitourinary   Musculoskeletal negative musculoskeletal ROS (+)    Abdominal   Peds negative pediatric ROS (+)  Hematology negative hematology ROS (+)   Anesthesia Other Findings   Reproductive/Obstetrics negative OB ROS                             Anesthesia Physical Anesthesia Plan  ASA: 3  Anesthesia Plan: MAC   Post-op Pain Management: Minimal or no pain anticipated   Induction: Intravenous  PONV Risk Score and Plan: 0  Airway Management Planned: Natural Airway and Nasal Cannula  Additional Equipment:   Intra-op Plan:   Post-operative Plan:   Informed Consent: I have reviewed the patients History and Physical, chart, labs and discussed the procedure including the risks, benefits and alternatives for the proposed anesthesia with the patient or authorized representative who has indicated his/her understanding and acceptance.     Dental advisory given  Plan Discussed with: CRNA and Surgeon  Anesthesia Plan Comments:        Anesthesia Quick Evaluation

## 2022-07-09 NOTE — Transfer of Care (Signed)
Immediate Anesthesia Transfer of Care Note  Patient: Annette Johnston  Procedure(s) Performed: CATARACT EXTRACTION PHACO AND INTRAOCULAR LENS PLACEMENT (IOC) (Left: Eye)  Patient Location: PACU  Anesthesia Type:MAC  Level of Consciousness: awake, alert , oriented, and patient cooperative  Airway & Oxygen Therapy: Patient Spontanous Breathing  Post-op Assessment: Report given to RN and Post -op Vital signs reviewed and stable  Post vital signs: Reviewed and stable  Last Vitals:  Vitals Value Taken Time  BP    Temp    Pulse    Resp    SpO2      Last Pain:  Vitals:   07/09/22 1305  TempSrc: Oral  PainSc: 0-No pain      Patients Stated Pain Goal: 5 (09/24/15 3567)  Complications: No notable events documented. VSS

## 2022-07-09 NOTE — Anesthesia Postprocedure Evaluation (Signed)
Anesthesia Post Note  Patient: NITI LEISURE  Procedure(s) Performed: CATARACT EXTRACTION PHACO AND INTRAOCULAR LENS PLACEMENT (IOC) (Left: Eye)  Patient location during evaluation: Phase II Anesthesia Type: MAC Level of consciousness: awake and alert and oriented Pain management: pain level controlled Vital Signs Assessment: post-procedure vital signs reviewed and stable Respiratory status: spontaneous breathing, nonlabored ventilation and respiratory function stable Cardiovascular status: blood pressure returned to baseline and stable Postop Assessment: no apparent nausea or vomiting Anesthetic complications: no  No notable events documented.   Last Vitals:  Vitals:   07/09/22 1305 07/09/22 1410  BP: 130/78 131/68  Pulse: 87 67  Resp: 18 18  Temp: 36.9 C 36.7 C  SpO2: 98% 100%    Last Pain:  Vitals:   07/09/22 1410  TempSrc: Oral  PainSc: 0-No pain                 Deshay Kirstein C Ryott Rafferty

## 2022-07-10 ENCOUNTER — Other Ambulatory Visit: Payer: Self-pay | Admitting: Psychiatry

## 2022-07-10 DIAGNOSIS — F411 Generalized anxiety disorder: Secondary | ICD-10-CM

## 2022-07-10 DIAGNOSIS — F4001 Agoraphobia with panic disorder: Secondary | ICD-10-CM

## 2022-07-11 ENCOUNTER — Encounter (HOSPITAL_COMMUNITY): Payer: Self-pay | Admitting: Ophthalmology

## 2022-07-16 ENCOUNTER — Encounter (HOSPITAL_COMMUNITY)
Admission: RE | Admit: 2022-07-16 | Discharge: 2022-07-16 | Disposition: A | Discharge status: Home / Self Care | Payer: Medicare HMO | Source: Ambulatory Visit | Attending: Ophthalmology | Admitting: Ophthalmology

## 2022-07-16 NOTE — Pre-Procedure Instructions (Signed)
Attempted pre-op phonecall. Left VM for her to call us back. 

## 2022-07-18 ENCOUNTER — Other Ambulatory Visit: Payer: Self-pay | Admitting: Gastroenterology

## 2022-07-18 ENCOUNTER — Other Ambulatory Visit: Payer: Self-pay | Admitting: Psychiatry

## 2022-07-18 DIAGNOSIS — R1013 Epigastric pain: Secondary | ICD-10-CM

## 2022-07-18 NOTE — Telephone Encounter (Signed)
Please schedule appt

## 2022-07-18 NOTE — H&P (Signed)
Surgical History & Physical  Patient Name: Annette Johnston DOB: 11-20-1945  Surgery: Cataract extraction with intraocular lens implant phacoemulsification; Right Eye  Surgeon: Baruch Goldmann MD Surgery Date:  07-23-22 Pre-Op Date:  07-16-22  HPI: A 42 Yr. old female patient 1. The patient is returning after cataract surgery. The left eye is affected. Status post cataract surgery, which began 1 weeks ago: Since the last visit, the affected area is doing well. The patient's vision is improved. Patient is following medication instructions. 2. 2. The patient is returning for a cataract follow-up of the right eye. Since the last visit, the affected area is worsening. The patient's vision is blurry. The complaint is associated with difficulty reading traffic signs/street signs, difficulty reading small print on medicine bottles/labels, and difficulty driving at night due to halos/glare. This is negatively affecting the patient's quality of life and the patient is unable to function adequately in life with the current level of vision. HPI was performed by Baruch Goldmann .  Medical History: Dry Eyes Cataracts Epiphora h/o Benign orbital tumor OS Macula Degeneration Glaucoma Anxiety disorder, Bipolar Disorder Parkinson's d... High Blood Pressure Thyroid Problems  Review of Systems Negative Allergic/Immunologic Negative Cardiovascular Negative Constitutional Negative Ear, Nose, Mouth & Throat Negative Endocrine Negative Eyes Negative Gastrointestinal Negative Genitourinary Negative Hemotologic/Lymphatic Negative Integumentary Negative Musculoskeletal Negative Neurological Negative Psychiatry Negative Respiratory  Social   Never smoked   Medication Artificial Tears, Prednisolone-Moxifloxacin-Bromfenac,  Benzonatate, Carbidopa, Levothyroxine Sodium, Lorazepam, Paroxetine, Viberzi,   Sx/Procedures Orbital tumor removed, Lasik-Myopic, Phaco c IOL,  Breast implant, Arm repair sx,  Hysterectomy,   Drug Allergies   NKDA  History & Physical: Heent: Cataract, right eye NECK: supple without bruits LUNGS: lungs clear to auscultation CV: regular rate and rhythm Abdomen: soft and non-tender Impression & Plan: Assessment: 1.  CATARACT EXTRACTION STATUS; Left Eye (Z98.42) 2.  COMBINED FORMS AGE RELATED CATARACT; Both Eyes (H25.813) 3.  NUCLEAR SCLEROSIS AGE RELATED; Right Eye (H25.11) 4.  Dry Eye Syndrome; ,, Both Eyes (H04.123)  Plan: 1.  1 week after cataract surgery. Doing well with improved vision and normal eye pressure. Call with any problems or concerns. Continue Pred-Moxi-Brom 2x/day for 3 more weeks.  2.  Cataract accounts for the patient's decreased vision. This visual impairment is not correctable with a tolerable change in glasses or contact lenses. Cataract surgery with an implantation of a new lens should significantly improve the visual and functional status of the patient. Discussed all risks, benefits, alternatives, and potential complications. Discussed the procedures and recovery. Patient desires to have surgery. A-scan ordered and performed today for intra-ocular lens calculations. The surgery will be performed in order to improve vision for driving, reading, and for eye examinations. Recommend phacoemulsification with intra-ocular lens. Recommend Dextenza for post-operative pain and inflammation. Right Eye. Surgery required to correct imbalance of vision. Dilates well - shugarcaine by protocol. S/P Laser Vision Correction - myopic LASIK.  3.   4.  Start Xiidra 1 drop every 12 hours both eyes.

## 2022-07-18 NOTE — Telephone Encounter (Signed)
Phoned and advised the pt of Rx for Pantoprazole being sent to her pharmacy

## 2022-07-23 ENCOUNTER — Ambulatory Visit (HOSPITAL_COMMUNITY): Payer: Medicare HMO | Admitting: Certified Registered Nurse Anesthetist

## 2022-07-23 ENCOUNTER — Encounter (HOSPITAL_COMMUNITY)
Admission: RE | Disposition: A | Discharge status: Home / Self Care | Payer: Self-pay | Source: Home / Self Care | Attending: Ophthalmology

## 2022-07-23 ENCOUNTER — Ambulatory Visit (HOSPITAL_BASED_OUTPATIENT_CLINIC_OR_DEPARTMENT_OTHER): Payer: Medicare HMO | Admitting: Certified Registered Nurse Anesthetist

## 2022-07-23 ENCOUNTER — Ambulatory Visit (HOSPITAL_COMMUNITY)
Admission: RE | Admit: 2022-07-23 | Discharge: 2022-07-23 | Disposition: A | Discharge status: Home / Self Care | Payer: Medicare HMO | Attending: Ophthalmology | Admitting: Ophthalmology

## 2022-07-23 DIAGNOSIS — Z9842 Cataract extraction status, left eye: Secondary | ICD-10-CM | POA: Insufficient documentation

## 2022-07-23 DIAGNOSIS — H25811 Combined forms of age-related cataract, right eye: Secondary | ICD-10-CM | POA: Diagnosis present

## 2022-07-23 DIAGNOSIS — F418 Other specified anxiety disorders: Secondary | ICD-10-CM | POA: Diagnosis not present

## 2022-07-23 DIAGNOSIS — F319 Bipolar disorder, unspecified: Secondary | ICD-10-CM | POA: Insufficient documentation

## 2022-07-23 DIAGNOSIS — H04123 Dry eye syndrome of bilateral lacrimal glands: Secondary | ICD-10-CM | POA: Insufficient documentation

## 2022-07-23 DIAGNOSIS — E039 Hypothyroidism, unspecified: Secondary | ICD-10-CM

## 2022-07-23 HISTORY — PX: CATARACT EXTRACTION W/PHACO: SHX586

## 2022-07-23 SURGERY — PHACOEMULSIFICATION, CATARACT, WITH IOL INSERTION
Anesthesia: Monitor Anesthesia Care | Site: Eye | Laterality: Right

## 2022-07-23 MED ORDER — MIDAZOLAM HCL 2 MG/2ML IJ SOLN
INTRAMUSCULAR | Status: AC
  Filled 2022-07-23: qty 2

## 2022-07-23 MED ORDER — BSS IO SOLN
INTRAOCULAR | Status: DC | PRN
  Administered 2022-07-23: 15 mL via INTRAOCULAR

## 2022-07-23 MED ORDER — LIDOCAINE HCL (PF) 1 % IJ SOLN
INTRAOCULAR | Status: DC | PRN
  Administered 2022-07-23: 1 mL via OPHTHALMIC

## 2022-07-23 MED ORDER — POVIDONE-IODINE 5 % OP SOLN
OPHTHALMIC | Status: DC | PRN
  Administered 2022-07-23: 1 via OPHTHALMIC

## 2022-07-23 MED ORDER — LIDOCAINE HCL 3.5 % OP GEL: 1.0000 | Freq: Once | OPHTHALMIC | Status: DC

## 2022-07-23 MED ORDER — TROPICAMIDE 1 % OP SOLN
1.0000 [drp] | OPHTHALMIC | Status: DC | PRN
  Administered 2022-07-23 (×2): 1 [drp] via OPHTHALMIC

## 2022-07-23 MED ORDER — TETRACAINE HCL 0.5 % OP SOLN
1.0000 [drp] | OPHTHALMIC | Status: DC | PRN
  Administered 2022-07-23 (×2): 1 [drp] via OPHTHALMIC

## 2022-07-23 MED ORDER — MIDAZOLAM HCL 5 MG/5ML IJ SOLN
INTRAMUSCULAR | Status: DC | PRN
  Administered 2022-07-23: 2 mg via INTRAVENOUS

## 2022-07-23 MED ORDER — MOXIFLOXACIN HCL 0.5 % OP SOLN
OPHTHALMIC | Status: DC | PRN
  Administered 2022-07-23: .2 mL via OPHTHALMIC

## 2022-07-23 MED ORDER — SODIUM HYALURONATE 23MG/ML IO SOSY
PREFILLED_SYRINGE | INTRAOCULAR | Status: DC | PRN
  Administered 2022-07-23: .6 mL via INTRAOCULAR

## 2022-07-23 MED ORDER — MOXIFLOXACIN HCL 5 MG/ML IO SOLN
INTRAOCULAR | Status: AC
  Filled 2022-07-23: qty 1

## 2022-07-23 MED ORDER — PHENYLEPHRINE HCL 2.5 % OP SOLN
1.0000 [drp] | OPHTHALMIC | Status: DC | PRN
  Administered 2022-07-23 (×2): 1 [drp] via OPHTHALMIC

## 2022-07-23 MED ORDER — SODIUM HYALURONATE 10 MG/ML IO SOLUTION
PREFILLED_SYRINGE | INTRAOCULAR | Status: DC | PRN
  Administered 2022-07-23: .85 mL via INTRAOCULAR

## 2022-07-23 MED ORDER — EPINEPHRINE PF 1 MG/ML IJ SOLN
INTRAMUSCULAR | Status: AC
  Filled 2022-07-23: qty 2

## 2022-07-23 MED ORDER — STERILE WATER FOR IRRIGATION IR SOLN
Status: DC | PRN
  Administered 2022-07-23: 250 mL

## 2022-07-23 MED ORDER — EPINEPHRINE PF 1 MG/ML IJ SOLN
INTRAOCULAR | Status: DC | PRN
  Administered 2022-07-23: 500 mL

## 2022-07-23 SURGICAL SUPPLY — 15 items
CATARACT SUITE SIGHTPATH (MISCELLANEOUS) ×1 IMPLANT
CLOTH BEACON ORANGE TIMEOUT ST (SAFETY) ×1 IMPLANT
EYE SHIELD UNIVERSAL CLEAR (GAUZE/BANDAGES/DRESSINGS) IMPLANT
FEE CATARACT SUITE SIGHTPATH (MISCELLANEOUS) ×1 IMPLANT
GLOVE BIOGEL PI IND STRL 6.5 (GLOVE) IMPLANT
GLOVE BIOGEL PI IND STRL 7.0 (GLOVE) ×2 IMPLANT
GLOVE SURG SS PI 7.0 STRL IVOR (GLOVE) IMPLANT
LENS IOL RAYNER 19.0 (Intraocular Lens) ×1 IMPLANT
LENS IOL RAYONE EMV 19.0 (Intraocular Lens) IMPLANT
NDL HYPO 18GX1.5 BLUNT FILL (NEEDLE) ×1 IMPLANT
NEEDLE HYPO 18GX1.5 BLUNT FILL (NEEDLE) ×1 IMPLANT
PAD ARMBOARD 7.5X6 YLW CONV (MISCELLANEOUS) ×1 IMPLANT
SYR TB 1ML LL NO SAFETY (SYRINGE) ×1 IMPLANT
TAPE PAPER 2X10 WHT MICROPORE (GAUZE/BANDAGES/DRESSINGS) IMPLANT
WATER STERILE IRR 250ML POUR (IV SOLUTION) ×1 IMPLANT

## 2022-07-23 NOTE — Op Note (Signed)
Date of procedure: 07/23/22  Pre-operative diagnosis:  Visually significant combined form age-related cataract, Right Eye (H25.811)  Post-operative diagnosis:  Visually significant combined form age-related cataract, Right Eye (H25.811)  Procedure: Removal of cataract via phacoemulsification and insertion of intra-ocular lens Rayner RAO200E +19.0D into the capsular bag of the Right Eye  Attending surgeon: Gerda Diss. Asahd Can, MD, MA  Anesthesia: MAC, Topical Akten  Complications: None  Estimated Blood Loss: <68m (minimal)  Specimens: None  Implants: As above  Indications:  Visually significant age-related cataract, Right Eye  Procedure:  The patient was seen and identified in the pre-operative area. The operative eye was identified and dilated.  The operative eye was marked.  Topical anesthesia was administered to the operative eye.     The patient was then to the operative suite and placed in the supine position.  A timeout was performed confirming the patient, procedure to be performed, and all other relevant information.   The patient's face was prepped and draped in the usual fashion for intra-ocular surgery.  A lid speculum was placed into the operative eye and the surgical microscope moved into place and focused.  A superotemporal paracentesis was created using a 20 gauge paracentesis blade.  Shugarcaine was injected into the anterior chamber.  Viscoelastic was injected into the anterior chamber.  A temporal clear-corneal main wound incision was created using a 2.413mmicrokeratome.  A continuous curvilinear capsulorrhexis was initiated using an irrigating cystitome and completed using capsulorrhexis forceps.  Hydrodissection and hydrodeliniation were performed.  Viscoelastic was injected into the anterior chamber.  A phacoemulsification handpiece and a chopper as a second instrument were used to remove the nucleus and epinucleus. The irrigation/aspiration handpiece was used to remove any  remaining cortical material.   The capsular bag was reinflated with viscoelastic, checked, and found to be intact.  The intraocular lens was inserted into the capsular bag.  The irrigation/aspiration handpiece was used to remove any remaining viscoelastic.  The clear corneal wound and paracentesis wounds were then hydrated and checked with Weck-Cels to be watertight. 0.77m40mf moxifloxacin was injected into the anterior chamber. The lid-speculum was removed.  The drape was removed.  The patient's face was cleaned with a wet and dry 4x4. A clear shield was taped over the eye. The patient was taken to the post-operative care unit in good condition, having tolerated the procedure well.  Post-Op Instructions: The [facility] post[facility] and will receive all other orders and instructions.

## 2022-07-23 NOTE — Discharge Instructions (Signed)
Please discharge patient when stable, will follow up today with Dr. Ailsa Mireles at the [facility] office immediately following discharge.  Leave shield in place until visit.  All paperwork with discharge instructions will be given at the office.  [facility] Address:  [address]  [place], [place] [address]  

## 2022-07-23 NOTE — Transfer of Care (Signed)
Immediate Anesthesia Transfer of Care Note  Patient: Annette Johnston  Procedure(s) Performed: CATARACT EXTRACTION PHACO AND INTRAOCULAR LENS PLACEMENT (IOC) (Right: Eye)  Patient Location: PACU  Anesthesia Type:MAC  Level of Consciousness: awake, alert , and oriented  Airway & Oxygen Therapy: Patient Spontanous Breathing  Post-op Assessment: Report given to RN and Post -op Vital signs reviewed and stable  Post vital signs: Reviewed and stable  Last Vitals:  Vitals Value Taken Time  BP    Temp    Pulse    Resp    SpO2      Last Pain:  Vitals:   07/23/22 0832  TempSrc: Oral  PainSc: 0-No pain      Patients Stated Pain Goal: 5 (56/38/93 7342)  Complications: No notable events documented.

## 2022-07-23 NOTE — Interval H&P Note (Signed)
History and Physical Interval Note:  07/23/2022 8:54 AM  Annette Johnston  has presented today for surgery, with the diagnosis of combined forms age related cataract; right.  The various methods of treatment have been discussed with the patient and family. After consideration of risks, benefits and other options for treatment, the patient has consented to  Procedure(s) with comments: CATARACT EXTRACTION PHACO AND INTRAOCULAR LENS PLACEMENT ([place]) (Right) - CDE as a surgical intervention.  The patient's history has been reviewed, patient examined, no change in status, stable for surgery.  I have reviewed the patient's chart and labs.  Questions were answered to the patient's satisfaction.     Baruch Goldmann

## 2022-07-23 NOTE — Anesthesia Preprocedure Evaluation (Signed)
Anesthesia Evaluation  Patient identified by MRN, date of birth, ID band Patient awake    Reviewed: Allergy & Precautions, H&P , NPO status , Patient's Chart, lab work & pertinent test results  Airway Mallampati: II  TM Distance: >3 FB Neck ROM: Full    Dental  (+) Dental Advisory Given, Missing   Pulmonary shortness of breath and with exertion   Pulmonary exam normal breath sounds clear to auscultation       Cardiovascular negative cardio ROS Normal cardiovascular exam Rhythm:Regular Rate:Normal     Neuro/Psych  Headaches PSYCHIATRIC DISORDERS Anxiety Depression Bipolar Disorder    Neuromuscular disease (parkinsonism)    GI/Hepatic Neg liver ROS,GERD  Medicated and Controlled,,  Endo/Other  Hypothyroidism    Renal/GU negative Renal ROS  negative genitourinary   Musculoskeletal negative musculoskeletal ROS (+)    Abdominal   Peds negative pediatric ROS (+)  Hematology negative hematology ROS (+)   Anesthesia Other Findings   Reproductive/Obstetrics negative OB ROS                              Anesthesia Physical Anesthesia Plan  ASA: 3  Anesthesia Plan: MAC   Post-op Pain Management: Minimal or no pain anticipated   Induction: Intravenous  PONV Risk Score and Plan: 0  Airway Management Planned: Natural Airway and Nasal Cannula  Additional Equipment:   Intra-op Plan:   Post-operative Plan:   Informed Consent: I have reviewed the patients History and Physical, chart, labs and discussed the procedure including the risks, benefits and alternatives for the proposed anesthesia with the patient or authorized representative who has indicated his/her understanding and acceptance.     Dental advisory given  Plan Discussed with: CRNA and Surgeon  Anesthesia Plan Comments:         Anesthesia Quick Evaluation

## 2022-07-23 NOTE — Anesthesia Postprocedure Evaluation (Signed)
Anesthesia Post Note  Patient: Annette Johnston  Procedure(s) Performed: CATARACT EXTRACTION PHACO AND INTRAOCULAR LENS PLACEMENT (IOC) (Right: Eye)  Patient location during evaluation: Phase II Anesthesia Type: MAC Level of consciousness: awake Pain management: pain level controlled Vital Signs Assessment: post-procedure vital signs reviewed and stable Respiratory status: spontaneous breathing and respiratory function stable Cardiovascular status: blood pressure returned to baseline and stable Postop Assessment: no headache and no apparent nausea or vomiting Anesthetic complications: no Comments: Late entry   No notable events documented.   Last Vitals:  Vitals:   07/23/22 0832 07/23/22 0920  BP: 108/80 (!) 120/51  Pulse: 80 66  Resp: 14 16  Temp: 36.6 C 36.6 C  SpO2: 97% 100%    Last Pain:  Vitals:   07/23/22 0920  TempSrc: Oral  PainSc: 0-No pain                 Louann Sjogren

## 2022-07-25 ENCOUNTER — Encounter (HOSPITAL_COMMUNITY): Payer: Self-pay | Admitting: Ophthalmology

## 2022-08-07 ENCOUNTER — Other Ambulatory Visit: Payer: Self-pay | Admitting: Gastroenterology

## 2022-08-07 DIAGNOSIS — R1013 Epigastric pain: Secondary | ICD-10-CM

## 2022-08-08 NOTE — Telephone Encounter (Signed)
Last seen by Flossie Dibble on 03/22/22. Has not been seen at our office that I can tell. Chelsea did last fill rx 07/18/22 but she is out of office this week.

## 2022-08-08 NOTE — Telephone Encounter (Signed)
[name], is at hospital working. I will send to her.

## 2022-08-14 ENCOUNTER — Other Ambulatory Visit: Payer: Self-pay | Admitting: Psychiatry

## 2022-08-15 ENCOUNTER — Other Ambulatory Visit: Payer: Self-pay | Admitting: Psychiatry

## 2022-08-15 DIAGNOSIS — F411 Generalized anxiety disorder: Secondary | ICD-10-CM

## 2022-08-15 DIAGNOSIS — F4001 Agoraphobia with panic disorder: Secondary | ICD-10-CM

## 2022-08-15 NOTE — Telephone Encounter (Signed)
Pt called requesting RF today if possible. Don't want to run out.

## 2022-08-20 ENCOUNTER — Telehealth: Payer: Self-pay | Admitting: Psychiatry

## 2022-08-20 ENCOUNTER — Other Ambulatory Visit: Payer: Self-pay | Admitting: Psychiatry

## 2022-08-20 DIAGNOSIS — G2401 Drug induced subacute dyskinesia: Secondary | ICD-10-CM

## 2022-08-20 NOTE — Telephone Encounter (Signed)
Pt Lvm @ 1:07p.  She would like someone to call her back about her medications.  Next appt 1/25

## 2022-08-20 NOTE — Telephone Encounter (Signed)
Patient would like samples of Lybalvi 5/10 mg mailed to her. She is going on a cruise 2/1 and is not due for a refill until 2/8.

## 2022-08-21 ENCOUNTER — Encounter: Payer: Self-pay | Admitting: Internal Medicine

## 2022-08-21 NOTE — Telephone Encounter (Signed)
Put samples in the mail.

## 2022-08-22 ENCOUNTER — Ambulatory Visit: Payer: Medicare HMO | Admitting: Psychiatry

## 2022-08-29 ENCOUNTER — Telehealth: Payer: Self-pay

## 2022-08-29 NOTE — Telephone Encounter (Signed)
Prior Authorization submitted for Lybalvi 5-10 mg with [employer], pending response.

## 2022-09-05 NOTE — Telephone Encounter (Signed)
Pt's prior authorization submitted twice for Lybalvi 5-10 mg they require pt try formularies listed 3 in a class with 3 or more alternatives: Pt has tried Aripiprazole, quetiapine, lurasidone, risperidone  Also listed as formularies is Clozapine, Olanzapine, Ziprasidone, Quetiapine XR, and Vraylar.   Will discuss with Dr. Clovis Pu if pt has tried other formularies listed above. Then submit updated information.

## 2022-09-06 ENCOUNTER — Encounter: Payer: Self-pay | Admitting: Psychiatry

## 2022-09-06 ENCOUNTER — Ambulatory Visit (INDEPENDENT_AMBULATORY_CARE_PROVIDER_SITE_OTHER): Payer: Medicare HMO | Admitting: Psychiatry

## 2022-09-06 DIAGNOSIS — F319 Bipolar disorder, unspecified: Secondary | ICD-10-CM | POA: Diagnosis not present

## 2022-09-06 DIAGNOSIS — F411 Generalized anxiety disorder: Secondary | ICD-10-CM

## 2022-09-06 DIAGNOSIS — G3184 Mild cognitive impairment, so stated: Secondary | ICD-10-CM

## 2022-09-06 DIAGNOSIS — G2401 Drug induced subacute dyskinesia: Secondary | ICD-10-CM

## 2022-09-06 DIAGNOSIS — F4001 Agoraphobia with panic disorder: Secondary | ICD-10-CM

## 2022-09-06 DIAGNOSIS — F5105 Insomnia due to other mental disorder: Secondary | ICD-10-CM

## 2022-09-06 MED ORDER — LORAZEPAM 0.5 MG PO TABS: ORAL_TABLET | ORAL | 3 refills | Status: DC

## 2022-09-06 MED ORDER — LYBALVI 5-10 MG PO TABS: 1.0000 | ORAL_TABLET | Freq: Every evening | ORAL | 3 refills | Status: DC

## 2022-09-06 NOTE — Progress Notes (Signed)
Annette Johnston [id number] [birth year] 77 y.o.    Subjective:   Patient ID:  Annette Johnston is a 77 y.o. (DOB [birth year]) female.  Chief Complaint:  Chief Complaint  Patient presents with   Follow-up    Bipolar I disorder with depression ([place])   Depression   Anxiety   Medication Reaction    Anxiety Symptoms include decreased concentration and nervous/anxious behavior. Patient reports no confusion, dizziness or suicidal ideas.    Depression        Associated symptoms include decreased concentration.  Associated symptoms include no suicidal ideas.  Past medical history includes anxiety.     Annette Johnston presents to the office today for follow-up of recent worsening TD after stopping [name]  When seen October 23, 2018.  No med changes were made as of that date.  She had just gotten back on the [name] 120 mg a day which was helpful for her tardive dyskinesia symptoms.  At visit Dec 23, 2018.  No meds were changed.  She was spending too much time in bed and she was encouraged to get out of bed.  When seen April 27, 2019.  Because of racing thoughts oxcarbazepine was increased to 300 mg in the morning and 600 mg at night.  It helped. She was having significant residual tardive dyskinesia symptoms on [name] at a high dosage but she did not want to change medicines at that time.  However since that time she called and wanted to make the change to Austedo.  There have been multiple phone calls since September 14.  She called October 6 complaining that her jaw was locking up.  A trial of benztropine 0.5 mg twice daily was ineffective.  She called October 12 stating the tardive dyskinesia symptoms were worse.  There was some concern that perhaps Trileptal was inducing metabolism of [name].  We started the switch from [name] to [place].  She was given lorazepam 0.5 mg 4 times daily for anxiety.  As of October 21 she had increased Austedo to 12 mg twice daily and felt it was  helpful.  When seen June 16, 2019 she was complaining of nausea.  Zofran was not effective.  She was given Phenergan.  There was a question as to whether it was related to Austedo.  She was also encouraged to stop meloxicam.  seen July 08, 2019.  The following changes were made: Increase carbidopa-levodopa to 2 in the morning and 1 in the afternoon and 1 in the evening for 4 days. If tolerated but not helpful enough for balance then increase to 2 tablets 3 times daily. For anxiety add Ativan 1 tablet 3 times daily.  Doing better and less agitated with Ativan.  A lot of it was nerves.  Also less movement disorder with change in carbidopa-levodopa to 2 tablets 3 times daily.  Better balance.  Not in bed as much in the last 2 weeks.  I feel a big difference with the Ativan.  Less anxiety and panic.  seen July 29, 2019.  No meds were changed.  Overall she was satisfied with the meds at that time.  seen September 29, 2019.  She was having some racing thoughts and oxcarbazepine 300 mg was increased to 1 in the morning and 2 at night.  She continued paroxetine 30 mg daily, lorazepam 0.5 mg 3 times daily to 4 times daily, Austedo 12 mg twice daily, Sinemet 25-100 mg 2 tablets twice daily.  She called April 14 complaining of  severe panic and was given extra lorazepam.  She claims compliance with medications.  She was scheduled for an urgent appointment the following day.    As of November 26, 2019 she reports the following. Increased anxiety and panic.  Felt SOB, dizzy and called ER bc choking.  Another one yesterday.  ER workup was OK.  Given 1 mg Ativan to ER.  That calmed me down.  Still SOB and constantly grunting and tongue twirling.  Mild slurring.  Came on suddenly on Tuesday.  Called neuro but couldn't get appt until May. Balance is poor. Plenty of sleep. Not grunting at night. She is not having manic symptoms  Note she's been taking 1 of 30 mg paroxetine daily not 15 mg as indicated  previously in the chart. Plan: For  anxiety increase oxcarbazepine to 1 in the morning and 2 at night of the 300 mg tablets. Anxiety is still bad.   Increase lorazepam 0.5 mg QID Continue Austedo 12 mg tablets, 1 twice daily.  Continue carbidopa levodopa 25-100 mg 2 tablets twice daily. Continue paroxetine  30 mg daily for anxiety.  12/29/2019 appointment, the following is noted: Started grunting when gets agitated.  Fidgety. Had panic after accidentally stopping Austedo for a week.  Better anxiety back on her meds.  Worsening tongue movements and sucking lip.   Nausea resolved when split up meds.  Upset hasn't seen daughter in 3 years and hasn't called last 2 mother's days.   No mania and depression is manageable.  Sleep is fine. Plan increase Austedo to 12 mg twice daily. Continue oxcarbazepine 300 mg in the morning and 600 mg nightly and lorazepam 0.5 mg 4 times daily and Sinemet 25-102 tablets twice daily, Continue paroxetine  30 mg daily for anxiety.  02/02/2020 appointment with with the following noted: Knee surgery and done OK with it so far.  StartingPT. Doing really well with increase Austedo 18 BID.  Less slinging tongue and arms.  Less sucking lips.  Tolerated Austedo without a problem.   Mood is pretty good.  Still lay around a lot.   Thinks walking will get better gradually.   Anxiety is pretty good. Still fidgety but not that bothersome.    Not usually angry..   Tolerating all meds.   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 Plan: No med changes today  09/07/2020 appointment with following noted: Still doesn't want to do anything.  [name] takes care of her.  Easily agitated.   Worries over looking for another grant for Austedo bc costs $180/month and can't afford that. On Austedo 6+12 mg daily.  Will try changing to 9 mg 2 tablets BID and see if that's covered with 1 copayment which would make this more affordable. Plan: No med changes  05/10/2021  appointment with the following noted: Bad panic couple mos ago and couldn't breathe.  Called 911. Took Ativan 0.5 mg prn.   More trouble with sleep initially. Doing nothing.  Weak  from even shower.  Sit in recliner all day. Get up 10 AM and to bed at 9.   Neuro sending her for further eval of movement disorder. $ stress.  Needs to sell land but H opposing it. Plan no med changes  06/16/2021 phone call: She has low sodium down to 123 and PCP is wondering if it is her psych meds. MD response: Even though she has been on these medications a long time Re: psych meds, hyponatremia  is  more likely related to paroxetine or oxcarbazepine than Austedo.  Sometimes these meds will cause low sodium levels even after many years of taking them medicines.  The only way to tell is to take her off of 1 at a time and see if the sodium level gets better.  She should stay in close contact with her primary care doctor and make sure she follows their instructions.  She should ask them if she should take salt tablets.  However I think she needs to come off the paroxetine.  However cut the dose in half for 10 days and then stop it.  If she gets real dizzy or spacey from stopping it let us know and we will taper it more slowly.  She also needs to get an appointment with me and get on the cancellation list.  06/28/2021 appointment with the following noted: Seen today after recent diagnosis of hyponatremia. FU PCP tomorrow.   Went to [facility] and neuro took her off Sinemet last week.  Hasn't noticed a change off it so far. She's been drinking Gatorade and sodium is better with last 129 up from 123. She didn't reduce the paroxetine as instructed bc "I was afraid to"  for fear of worsening anxiety and depression. "Ministroke" about a month ago causing bladder problems.   No mood swings.  Depressed and doesn't want to go out like she used to.  No interest or motivation.  08/29/2021 appt noted: DX PD by neuro [facility] and  started Sinemet Still some depression and not getting out of recliner much.  Will start PT Stayed on oxcarbazepine DT sodium "better" per Dr. Quintin Alto. Weight gain bc was told to drink Gatorade for sodium.  10/27/21 appt noted: [place] 09/19/21 and humerus fx.  Hasn't felt like riding in car.  Afraid of falls. Doing ok overall and nothing changed.   Regular doctor want to know about her meds.  Disc purpose of each med in detail. He told her to decrease oxcarbazepine to 1 twice daily but she doesn't know why and about 2 mos ago.  Maybe bc sodium level was low.  Sodium is better now. Taking Ativan '1mg'$  BID.  Anxiety about the same. Patient reports stable mood and denies depressed or irritable moods.  Patient denies any recent difficulty with anxiety.  Patient denies difficulty with sleep initiation or maintenance. Denies appetite disturbance.  Energy never very good.  Patient denies any difficulty with concentration.  Patient denies any suicidal ideation. No recent mania. Sodium 133. Plan: oxcarbazepine  1 in the morning and 1 at night of the 300 mg tablets. Reduced apparently lorazepam 0.5 mg QID and continue paroxetine 30 mg daily for anxiety. Her TD was very difficult to control.  Mild facial sx. Fidgety. Continue Austedo to 18 mg BID bc sig residual TD. She tolerated it and it helped tongue facial TD.  11/30/2021 appointment with the following noted: Arm not healed. Not doing good.  # stressors.   Shaking bad bc hasn't taken meds.  Normally shakiness under control.   Continued weakness and not able to go anywhere except to doctor.   Mental health issues are OK.  Using cane or walker.   Doing PT for 2 mos but running out DT insurance. Low sodium lately.  129 on 11/27/21 No mood swings.    03/08/22 appt noted: Increased lorazepam 1 mg HS and did better for awhile but trouble sleeping again.   So much on my mind trouble going to sleep.  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  Anxious dealing with renter.  A lot of stress. Can't do without the Ativan  only thing that saves me.  2-3 panic in last 3 mos.  Went to ER with panic and got panic and it stopped the last 2 panic.  She doesn't drive anymore. Taking Ativan 0.5 mg AM and 1 mg at 8 pm and to bed 9 pm.  Anxious and just wants to lay down. Under so much stress. No anger outbursts. Consistent with meds. Anxiety much worse with stress. Not falling.  05/28/22 appt noted: Not driving much.  Drove to church yesterday then got weak and shakey.  Just started back to church.  But gets nervous in a crowd.   Spending 12 hours daily in bed.  Sleeps 10 - 10.   Taking lorazepam 0.5 mg 4 times daily for anxiety and panic.  Can's sleep without it. Panic is triggered.  Chronic $ worries.  Needs to sell property but H can be oppositional about that.   Not bad irritable. No SE TD sx are better and not shaking as much . Not always depressed but sometimes.   H's main concerns are her walking and her anxiety.  Says she worries too much. 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 She thinks she reduced oxcarbazepine to 300 mg daily. Still on paroxetine 30 mg daily for panic. Plan: DC oxcarbazapine DT low sodium and start Lybalvi 5 mg as alternative mood stabilizer which should also be much better with anxiety.    09/06/22 appt noted: Psych meds: Austedo 80 mg twice daily, lorazepam 0.5 mg twice daily and 1 mg at bedtime, Lybalvi 5 nightly, paroxetine 30 daily.  Off oxcarbazepine Doing good without complaints.  Not shaking as much unless misses lorazepam.   Paying $70 for Lybalvi.  Likes it.  Anxiety is pretty good. Mood about the same.  Still stay inside all the time.  Chronic worry but getting better about letting go and not get so upset. Sleep is fine.  Going on Cruise   Past Psychiatric Medication Trials: Depakote jerks, lithium tremor, Equetro good response, oxcarbazepine 600 partial response,  risperidone side effects,  Seroquel 300,  Latuda, [name], Saphris  paroxetine 30 for panic,   sertraline,  [name] partial response Austedo 18 BID Lorazepam 0.5 3 times daily with good response  remote history of ECT,  Review of Systems:  Review of Systems  Musculoskeletal:  Positive for arthralgias, gait problem and joint swelling. Negative for back pain.  Neurological:  Positive for tremors and weakness. Negative for dizziness.       Poor balance  Psychiatric/Behavioral:  Positive for decreased concentration. Negative for agitation, behavioral problems, confusion, dysphoric mood, hallucinations, self-injury, sleep disturbance and suicidal ideas. The patient is nervous/anxious. The patient is not hyperactive.     Medications: I have reviewed the patient's current medications.  Current Outpatient Medications  Medication Sig Dispense Refill   aspirin 81 MG EC tablet Take 81 mg by mouth daily.     carbidopa-levodopa (SINEMET IR) 25-100 MG tablet Take 2 tablets by mouth 3 (three) times daily.     Cholecalciferol (VITAMIN D) 50 MCG (2000 UT) CAPS Take 4,000 Units by mouth daily.     Deutetrabenazine (AUSTEDO) 9 MG TABS TAKE 2 TABLETS BY MOUTH EVERY MORNING AND TAKE 2 TABLETS BY MOUTH EVERY NIGHT AT BEDTIME 120 tablet 5   diphenhydramine-acetaminophen (TYLENOL PM) 25-500 MG TABS tablet Take 2 tablets by mouth at bedtime.  Eluxadoline (VIBERZI) 100 MG TABS TAKE 1 TABLET BY MOUTH TWICE A DAY WITH MEALS 60 tablet 2   levothyroxine (SYNTHROID, LEVOTHROID) 137 MCG tablet Take 137 mcg by mouth daily before breakfast.     pantoprazole (PROTONIX) 40 MG tablet TAKE 1 TABLET BY MOUTH EVERY DAY BEFORE BREAKFAST 90 tablet 0   PARoxetine (PAXIL) 30 MG tablet TAKE 1 TABLET BY MOUTH EVERY DAY 90 tablet 0   LORazepam (ATIVAN) 0.5 MG tablet 1 tablet twice daily and 2 at night. Can take 1 extra if needed for panic 130 tablet 3   OLANZapine-Samidorphan (LYBALVI) 5-10 MG TABS Take 1 tablet by mouth every evening. 30 tablet 3   No  current facility-administered medications for this visit.    Medication Side Effects: None  Allergies:  Allergies  Allergen Reactions   Latex Rash    Bandaids    Past Medical History:  Diagnosis Date   Anxiety    Bipolar 1 disorder ([place])    Depression    Dyspnea on exertion july 2011   Normal stress test dec 2010. Reportedl normal CXR 2011. CT abdomen lung cut 2005  - normal. Normal PFT - march 2012   Hypothyroidism    Obesity (BMI 30-39.9)    BMI 36 Feb 2012   [name]    Urticaria     Family History  Problem Relation Age of Onset   Alzheimer's disease Mother    Heart attack Father    Coronary artery disease Brother    Heart attack Brother    Alzheimer's disease Paternal Grandmother    Allergic rhinitis Neg Hx    Angioedema Neg Hx    Asthma Neg Hx    Atopy Neg Hx    Eczema Neg Hx    Immunodeficiency Neg Hx    Urticaria Neg Hx    Colon cancer Neg Hx     Social History   Socioeconomic History   Marital status: Married    Spouse name: [name]   Number of children: 1   Years of education: Not on file   Highest education level: High school graduate  Occupational History   Occupation: retired  Tobacco Use   Smoking status: Never   Smokeless tobacco: Never  Vaping Use   Vaping Use: Never used  Substance and Sexual Activity   Alcohol use: Not Currently   Drug use: No   Sexual activity: Yes    Birth control/protection: Surgical  Other Topics Concern   Not on file  Social History Narrative   Caffeine 1 cup per day    Lives at home with husband    Right Handed.    Social Determinants of Health   Financial Resource Strain: Not on file  Food Insecurity: Not on file  Transportation Needs: Not on file  Physical Activity: Not on file  Stress: Not on file  Social Connections: Not on file  Intimate Partner Violence: Not on file    Past Medical History, Surgical history, Social history, and Family history were reviewed and updated as appropriate.    Please see review of systems for further details on the patient's review from today.   Objective:   Physical Exam:  There were no vitals taken for this visit.  Physical Exam Constitutional:      General: She is not in acute distress.    Appearance: She is well-developed.  Musculoskeletal:        General: No deformity.  Neurological:     Mental Status: She is alert and  oriented to person, place, and time.     Comments: Fidgety with legs is better.  Minimal other TD sx.  Psychiatric:        Attention and Perception: Attention and perception normal.        Mood and Affect: Mood is anxious. Mood is not depressed. Affect is not labile, blunt, angry or inappropriate.        Speech: Speech normal.        Behavior: Behavior normal.        Thought Content: Thought content normal. Thought content is not delusional. Thought content does not include homicidal or suicidal ideation. Thought content does not include suicidal plan.        Judgment: Judgment normal.     Comments: Fair insight chronically Affect looks better and more relaxed on Lybalvi Mildly fidgety.  Minimal facial movements of TD     Lab Review:     Component Value Date/Time   NA 129 (L) 11/27/2021 1441   NA 137 12/17/2017 1056   K 4.0 11/27/2021 1441   CL 97 (L) 11/27/2021 1441   CO2 22 11/27/2021 1441   GLUCOSE 97 11/27/2021 1441   BUN 11 11/27/2021 1441   BUN 15 12/17/2017 1056   CREATININE 0.50 11/27/2021 1441   CALCIUM 9.3 11/27/2021 1441   PROT 7.3 12/17/2017 1056   ALBUMIN 4.2 12/17/2017 1056   AST 30 12/17/2017 1056   ALT 26 12/17/2017 1056   ALKPHOS 77 12/17/2017 1056   BILITOT <0.2 12/17/2017 1056   GFRNONAA >60 11/27/2021 1441   GFRAA 104 12/17/2017 1056       Component Value Date/Time   WBC 7.2 11/27/2021 1441   RBC 4.48 11/27/2021 1441   HGB 13.7 11/27/2021 1441   HGB 13.1 12/17/2017 1056   HCT 40.2 11/27/2021 1441   HCT 38.2 12/17/2017 1056   PLT 308 11/27/2021 1441   PLT 329 12/17/2017  1056   MCV 89.7 11/27/2021 1441   MCV 90 12/17/2017 1056   MCH 30.6 11/27/2021 1441   MCHC 34.1 11/27/2021 1441   RDW 11.8 11/27/2021 1441   RDW 14.0 12/17/2017 1056   LYMPHSABS 1.6 12/17/2017 1056   MONOABS 0.5 06/06/2016 0959   EOSABS 0.3 12/17/2017 1056   BASOSABS 0.0 12/17/2017 1056    No results found for: "POCLITH", "LITHIUM"   No results found for: "PHENYTOIN", "PHENOBARB", "VALPROATE", "CBMZ"   Component 04/06/22 01/10/22 11/27/20 11/24/19 03/02/19 10/04/18  Sodium 129 Low  130 Low  134 Low  135 Low  129 Low   134 Low     .res Assessment: Plan:    Bipolar I disorder with depression ([place]) - Plan: OLANZapine-Samidorphan (LYBALVI) 5-10 MG TABS  Panic disorder with agoraphobia - Plan: OLANZapine-Samidorphan (LYBALVI) 5-10 MG TABS  Generalized anxiety disorder - Plan: OLANZapine-Samidorphan (LYBALVI) 5-10 MG TABS  Insomnia due to mental condition - Plan: LORazepam (ATIVAN) 0.5 MG tablet  Mild cognitive impairment  Tardive dyskinesia   Deconditioned still needs to work on this.  Discussed the importance of exercise  Greater than 50% of 30 min face to face time with patient was spent on counseling and coordination of care. We discussed Ms. Paulo is a very complicated and difficult to treat bipolar patient with tardive dyskinesia.  It is complicated by the presence as well of mild cognitive impairment which appears stable..  In addition complicated by multiple med failures and very limited options for mood stabilization.    Not manic.  Mood is stable and she looks  significantly less anxious and worried and distressed on the Lybalvi since starting that and stopping the oxcarbazepine oxcarbazepine was stopped due to hyponatremia.  She has not had a follow-up BMP in several months and needs to have that done to see if the hyponatremia is corrected.  Re; hyponatremia: Even though she has been on these medications a long time Re: psych meds, hyponatremia  is more likely related  to paroxetine or oxcarbazepine than Austedo.  She is afraid of stopping paroxetine because she is found it helpful for her anxiety and her anxiety is still not controlled.   DC oxcarbazapine DT low sodium and now stable on  Lybalvi 5 mg as alternative mood stabilizer which should also be much better with anxiety.  Don't have samples of low dose Caplyta which is another option.  When this runs out will switch to olanzapine for cost reasons.  No sig options other than atypical.  Will use low dose in hopes of not worsening movement disorder. Rec check sodium level  she says she will make appt with Dr. Quintin Alto.    Her tardive dyskinesia symptoms are   improved on 18 mg Austedo BID.  Now mostly resolved. resolved.  There are no worsening movement disorder problems since starting low-dose Lybalvi 5 mg Brain scan and neurology [facility] DX PD Jan 2023 and restarted Sinemet without benefit yet  lorazepam 0.5 mg BID and 1 mg HS  and 1 extra if needed for panic in hopes of keeping her out of ER for panic continue paroxetine 30 mg daily for anxiety but this is not managing her anxiety as hoped.  Consider increase but risk mood cycling but is not currently or recently manic  Her TD were very difficult to control.  Mild facial sx resolved.   Fidgety is better.  Continue Austedo to 18 mg BID bc sig residual TD. She tolerated it and it helped tongue facial TD. Disc risk increased EPS.  Spending too much time in bed.  Needs to stay out of bed more.  Inactivity is causing the weakness.  Needs more exercise and rec walking.  Starting therapy for her arm and knee..Deconditioned and needs exercise to correct.  She fears falling.  This is no better and not doing housework.  We discussed the short-term risks associated with benzodiazepines including sedation and increased fall risk among others.  Discussed long-term side effect risk including dependence, potential withdrawal symptoms, and the potential eventual dose-related  risk of dementia.  But recent studies from 2020 dispute this association between benzodiazepines and dementia risk. Newer studies in 2020 do not support an association with dementia.  Bruxism stopped.  FU 44-month CLynder Parents MD, DFAPA  Please see After Visit Summary for patient specific instructions.  No future appointments.   No orders of the defined types were placed in this encounter.      -------------------------------

## 2022-09-19 ENCOUNTER — Telehealth: Payer: Self-pay | Admitting: Psychiatry

## 2022-09-19 NOTE — Telephone Encounter (Signed)
It is not listed as a SE.  I don't have another good option.  She can try cutting it in half for a couple of weeks and see it that makes a difference.  This is the lowest dose.

## 2022-09-19 NOTE — Telephone Encounter (Signed)
Please advise if lybalvi can cause incontinence

## 2022-09-19 NOTE — Telephone Encounter (Signed)
Pt called inquiring as advised by PCP. Possible side effects from Lybalvi, could it cause incontinence? Pt having issues with this. RTC [phone] ASAP.

## 2022-09-20 ENCOUNTER — Telehealth: Payer: Self-pay | Admitting: *Deleted

## 2022-09-20 NOTE — Telephone Encounter (Signed)
Recommend scheduling an office visit for further evaluation. Looks like I could see her sometime next week.

## 2022-09-20 NOTE — Telephone Encounter (Signed)
Mandy/Susan: Can you reach patient to arrange follow-up? Looks like I should be able to see her early next week.

## 2022-09-20 NOTE — Telephone Encounter (Signed)
Noted  

## 2022-09-20 NOTE — Telephone Encounter (Signed)
Pt called and states she has been having incontinence episodes. She went to PCP and was told to contact this office, because it could be a side effect of Viberzi.  Please advise.

## 2022-09-20 NOTE — Telephone Encounter (Signed)
LVM with info

## 2022-09-21 NOTE — Telephone Encounter (Signed)
Noted  
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
## 2022-09-24 ENCOUNTER — Ambulatory Visit: Payer: Medicare HMO | Admitting: Gastroenterology

## 2022-09-24 ENCOUNTER — Encounter: Payer: Self-pay | Admitting: Gastroenterology

## 2022-09-24 ENCOUNTER — Ambulatory Visit (INDEPENDENT_AMBULATORY_CARE_PROVIDER_SITE_OTHER): Payer: Medicare HMO | Admitting: Gastroenterology

## 2022-09-24 VITALS — BP 108/72 | HR 105 | Temp 98.2°F | Ht 67.0 in | Wt 203.6 lb

## 2022-09-24 DIAGNOSIS — Z86010 Personal history of colonic polyps: Secondary | ICD-10-CM

## 2022-09-24 DIAGNOSIS — R32 Unspecified urinary incontinence: Secondary | ICD-10-CM

## 2022-09-24 DIAGNOSIS — K580 Irritable bowel syndrome with diarrhea: Secondary | ICD-10-CM

## 2022-09-24 MED ORDER — VIBERZI 100 MG PO TABS: 1.0000 | ORAL_TABLET | Freq: Two times a day (BID) | ORAL | 5 refills | Status: DC

## 2022-09-24 NOTE — Patient Instructions (Addendum)
I am providing you with a sample box of Viberzi 100 mg to help get you back between prescriptions getting filled.  Continue to take 100 mg twice daily.   We will workup him to see if there are any patient assistance programs for Viberzi.  Our staff may reach out to you for additional information if needed.  Please reach out to your primary care provider regarding urinary incontinence.  This is not a typical known side effect of Viberzi.  We will see you in 1 year, sooner if needed.  Please do not hesitate to reach out with any questions or concerns.  It was a pleasure to see you today. I want to create trusting relationships with patients. If you receive a survey regarding your visit,  I greatly appreciate you taking time to fill this out on paper or through your MyChart. I value your feedback.  Venetia Night, MSN, FNP-BC, AGACNP-BC [facility]
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
## 2022-10-17 ENCOUNTER — Other Ambulatory Visit: Payer: Self-pay

## 2022-10-17 DIAGNOSIS — F411 Generalized anxiety disorder: Secondary | ICD-10-CM

## 2022-10-17 DIAGNOSIS — F4001 Agoraphobia with panic disorder: Secondary | ICD-10-CM

## 2022-10-17 DIAGNOSIS — F319 Bipolar disorder, unspecified: Secondary | ICD-10-CM

## 2022-10-17 MED ORDER — LYBALVI 5-10 MG PO TABS: 1.0000 | ORAL_TABLET | Freq: Every evening | ORAL | 3 refills | Status: DC

## 2022-10-18 ENCOUNTER — Telehealth: Payer: Self-pay | Admitting: Psychiatry

## 2022-10-18 NOTE — Telephone Encounter (Signed)
Noted thanks I am working on this

## 2022-10-18 NOTE — Telephone Encounter (Signed)
[facility] LVM. Diagnosis code off label.  Annette Johnston  Rx  Call [phone]

## 2022-10-21 ENCOUNTER — Telehealth: Payer: Self-pay

## 2022-10-21 NOTE — Telephone Encounter (Signed)
Desiree from [employer] [phone]) called Friday afternoon.  She was asking for off label Dx and also Dx from list on page 3 from [facility] website. She also said to send Rx to local pharmacy.

## 2022-10-22 NOTE — Telephone Encounter (Signed)
Noted thanks will review

## 2022-10-23 NOTE — Telephone Encounter (Signed)
[facility] called. LVM need diagnosis code for Lybalvi 5 mg tablets. RTC [phone]

## 2022-10-24 NOTE — Telephone Encounter (Signed)
Spoke with someone at [facility] they just needed her diagnosis code, also told them she had previously been approved for Lybalvi in 2023 with same insurance but the criteria may have changed for 2024. They will process it and follow up with [place] via fax.

## 2022-10-29 NOTE — Telephone Encounter (Signed)
Submitted another PA through [employer] with correct diagnosis and answered all questions.

## 2022-11-16 ENCOUNTER — Telehealth: Payer: Self-pay | Admitting: Psychiatry

## 2022-11-16 NOTE — Telephone Encounter (Signed)
Annette Johnston at [facility] LVM @ 4:30p.   He said the pre-authorization for her Annette Johnston needs to be resent to her insurance company.  If you need to call Thayer Ohm at [place], their number is [phone] M-F 9a-8p EST   Next appt 4/17

## 2022-11-19 NOTE — Telephone Encounter (Signed)
[facility] has been contacted

## 2022-11-28 ENCOUNTER — Ambulatory Visit (INDEPENDENT_AMBULATORY_CARE_PROVIDER_SITE_OTHER): Payer: Medicare HMO | Admitting: Psychiatry

## 2022-11-28 ENCOUNTER — Encounter: Payer: Self-pay | Admitting: Psychiatry

## 2022-11-28 ENCOUNTER — Other Ambulatory Visit: Payer: Self-pay | Admitting: Psychiatry

## 2022-11-28 DIAGNOSIS — F411 Generalized anxiety disorder: Secondary | ICD-10-CM

## 2022-11-28 DIAGNOSIS — F4001 Agoraphobia with panic disorder: Secondary | ICD-10-CM

## 2022-11-28 DIAGNOSIS — F319 Bipolar disorder, unspecified: Secondary | ICD-10-CM | POA: Diagnosis not present

## 2022-11-28 DIAGNOSIS — G2401 Drug induced subacute dyskinesia: Secondary | ICD-10-CM

## 2022-11-28 DIAGNOSIS — G3184 Mild cognitive impairment, so stated: Secondary | ICD-10-CM

## 2022-11-28 DIAGNOSIS — F5105 Insomnia due to other mental disorder: Secondary | ICD-10-CM

## 2022-11-28 NOTE — Progress Notes (Signed)
Annette Johnston [id number] [birth year] 77 y.o.   Virtual Visit via Telephone Note  I connected with pt by telephone and verified that I am speaking with the correct person using two identifiers.   I discussed the limitations, risks, security and privacy concerns of performing an evaluation and management service by telephone and the availability of in person appointments. I also discussed with the patient that there may be a patient responsible charge related to this service. The patient expressed understanding and agreed to proceed.  I discussed the assessment and treatment plan with the patient. The patient was provided an opportunity to ask questions and all were answered. The patient agreed with the plan and demonstrated an understanding of the instructions.   The patient was advised to call back or seek an in-person evaluation if the symptoms worsen or if the condition fails to improve as anticipated.  I provided 30 minutes of non-face-to-face time during this encounter. The call started at 1130 and ended at 1200. The patient was located at office and the provider was located home. Have asked her to come to office to make easier to evaluate TD   Subjective:   Patient ID:  Annette Johnston is a 77 y.o. (DOB Aug 16, 1945) female.  Chief Complaint:  Chief Complaint  Patient presents with   Follow-up   Depression   Anxiety   Medication Reaction    Anxiety Symptoms include decreased concentration. Patient reports no confusion, dizziness, nervous/anxious behavior or suicidal ideas.    Depression        Associated symptoms include decreased concentration and fatigue.  Associated symptoms include no suicidal ideas.  Past medical history includes anxiety.     Annette Johnston presents to the office today for follow-up of recent worsening TD after stopping [name]  When seen October 23, 2018.  No med changes were made as of that date.  She had just gotten back on the [name] 120 mg a day  which was helpful for her tardive dyskinesia symptoms.  At visit Dec 23, 2018.  No meds were changed.  She was spending too much time in bed and she was encouraged to get out of bed.  When seen April 27, 2019.  Because of racing thoughts oxcarbazepine was increased to 300 mg in the morning and 600 mg at night.  It helped. She was having significant residual tardive dyskinesia symptoms on [name] at a high dosage but she did not want to change medicines at that time.  However since that time she called and wanted to make the change to Austedo.  There have been multiple phone calls since September 14.  She called October 6 complaining that her jaw was locking up.  A trial of benztropine 0.5 mg twice daily was ineffective.  She called October 12 stating the tardive dyskinesia symptoms were worse.  There was some concern that perhaps Trileptal was inducing metabolism of [name].  We started the switch from [place] to [place].  She was given lorazepam 0.5 mg 4 times daily for anxiety.  As of October 21 she had increased Austedo to 12 mg twice daily and felt it was helpful.  When seen June 16, 2019 she was complaining of nausea.  Zofran was not effective.  She was given Phenergan.  There was a question as to whether it was related to Austedo.  She was also encouraged to stop meloxicam.  seen July 08, 2019.  The following changes were made: Increase carbidopa-levodopa to 2 in the morning  and 1 in the afternoon and 1 in the evening for 4 days. If tolerated but not helpful enough for balance then increase to 2 tablets 3 times daily. For anxiety add Ativan 1 tablet 3 times daily.  Doing better and less agitated with Ativan.  A lot of it was nerves.  Also less movement disorder with change in carbidopa-levodopa to 2 tablets 3 times daily.  Better balance.  Not in bed as much in the last 2 weeks.  I feel a big difference with the Ativan.  Less anxiety and panic.  seen July 29, 2019.  No  meds were changed.  Overall she was satisfied with the meds at that time.  seen September 29, 2019.  She was having some racing thoughts and oxcarbazepine 300 mg was increased to 1 in the morning and 2 at night.  She continued paroxetine 30 mg daily, lorazepam 0.5 mg 3 times daily to 4 times daily, Austedo 12 mg twice daily, Sinemet 25-100 mg 2 tablets twice daily.  She called April 14 complaining of severe panic and was given extra lorazepam.  She claims compliance with medications.  She was scheduled for an urgent appointment the following day.    As of November 26, 2019 she reports the following. Increased anxiety and panic.  Felt SOB, dizzy and called ER bc choking.  Another one yesterday.  ER workup was OK.  Given 1 mg Ativan to ER.  That calmed me down.  Still SOB and constantly grunting and tongue twirling.  Mild slurring.  Came on suddenly on Tuesday.  Called neuro but couldn't get appt until May. Balance is poor. Plenty of sleep. Not grunting at night. She is not having manic symptoms  Note she's been taking 1 of 30 mg paroxetine daily not 15 mg as indicated previously in the chart. Plan: For  anxiety increase oxcarbazepine to 1 in the morning and 2 at night of the 300 mg tablets. Anxiety is still bad.   Increase lorazepam 0.5 mg QID Continue Austedo 12 mg tablets, 1 twice daily.  Continue carbidopa levodopa 25-100 mg 2 tablets twice daily. Continue paroxetine  30 mg daily for anxiety.  12/29/2019 appointment, the following is noted: Started grunting when gets agitated.  Fidgety. Had panic after accidentally stopping Austedo for a week.  Better anxiety back on her meds.  Worsening tongue movements and sucking lip.   Nausea resolved when split up meds.  Upset hasn't seen daughter in 3 years and hasn't called last 2 mother's days.   No mania and depression is manageable.  Sleep is fine. Plan increase Austedo to 12 mg twice daily. Continue oxcarbazepine 300 mg in the morning and 600 mg  nightly and lorazepam 0.5 mg 4 times daily and Sinemet 25-102 tablets twice daily, Continue paroxetine  30 mg daily for anxiety.  02/02/2020 appointment with with the following noted: Knee surgery and done OK with it so far.  StartingPT. Doing really well with increase Austedo 18 BID.  Less slinging tongue and arms.  Less sucking lips.  Tolerated Austedo without a problem.   Mood is pretty good.  Still lay around a lot.   Thinks walking will get better gradually.   Anxiety is pretty good. Still fidgety but not that bothersome.    Not usually angry..   Tolerating all meds.   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 Plan: No med changes today  09/07/2020 appointment with following noted: Still doesn't want to  do anything.  Annette Johnston takes care of her.  Easily agitated.   Worries over looking for another grant for Austedo bc costs $180/month and can't afford that. On Austedo 6+12 mg daily.  Will try changing to 9 mg 2 tablets BID and see if that's covered with 1 copayment which would make this more affordable. Plan: No med changes  05/10/2021 appointment with the following noted: Bad panic couple mos ago and couldn't breathe.  Called 911. Took Ativan 0.5 mg prn.   More trouble with sleep initially. Doing nothing.  Weak  from even shower.  Sit in recliner all day. Get up 10 AM and to bed at 9.   Neuro sending her for further eval of movement disorder. $ stress.  Needs to sell land but H opposing it. Plan no med changes  06/16/2021 phone call: She has low sodium down to 123 and PCP is wondering if it is her psych meds. MD response: Even though she has been on these medications a long time Re: psych meds, hyponatremia  is more likely related to paroxetine or oxcarbazepine than Austedo.  Sometimes these meds will cause low sodium levels even after many years of taking them medicines.  The only way to tell is to take her off of 1 at a time and see if the sodium level gets  better.  She should stay in close contact with her primary care doctor and make sure she follows their instructions.  She should ask them if she should take salt tablets.  However I think she needs to come off the paroxetine.  However cut the dose in half for 10 days and then stop it.  If she gets real dizzy or spacey from stopping it let us know and we will taper it more slowly.  She also needs to get an appointment with me and get on the cancellation list.  06/28/2021 appointment with the following noted: Seen today after recent diagnosis of hyponatremia. FU PCP tomorrow.   Went to [facility] and neuro took her off Sinemet last week.  Hasn't noticed a change off it so far. She's been drinking Gatorade and sodium is better with last 129 up from 123. She didn't reduce the paroxetine as instructed bc "I was afraid to"  for fear of worsening anxiety and depression. "Ministroke" about a month ago causing bladder problems.   No mood swings.  Depressed and doesn't want to go out like she used to.  No interest or motivation.  08/29/2021 appt noted: DX PD by neuro [facility] and started Sinemet Still some depression and not getting out of recliner much.  Will start PT Stayed on oxcarbazepine DT sodium "better" per Dr. Neita Carp. Weight gain bc was told to drink Gatorade for sodium.  10/27/21 appt noted: Larey Seat 09/19/21 and humerus fx.  Hasn't felt like riding in car.  Afraid of falls. Doing ok overall and nothing changed.   Regular doctor want to know about her meds.  Disc purpose of each med in detail. He told her to decrease oxcarbazepine to 1 twice daily but she doesn't know why and about 2 mos ago.  Maybe bc sodium level was low.  Sodium is better now. Taking Ativan 1mg  BID.  Anxiety about the same. Patient reports stable mood and denies depressed or irritable moods.  Patient denies any recent difficulty with anxiety.  Patient denies difficulty with sleep initiation or maintenance. Denies appetite disturbance.   Energy never very good.  Patient denies any difficulty with concentration.  Patient denies any suicidal ideation. No recent mania. Sodium 133. Plan: oxcarbazepine  1 in the morning and 1 at night of the 300 mg tablets. Reduced apparently lorazepam 0.5 mg QID and continue paroxetine 30 mg daily for anxiety. Her TD was very difficult to control.  Mild facial sx. Fidgety. Continue Austedo to 18 mg BID bc sig residual TD. She tolerated it and it helped tongue facial TD.  11/30/2021 appointment with the following noted: Arm not healed. Not doing good.  # stressors.   Shaking bad bc hasn't taken meds.  Normally shakiness under control.   Continued weakness and not able to go anywhere except to doctor.   Mental health issues are OK.  Using cane or walker.   Doing PT for 2 mos but running out DT insurance. Low sodium lately.  129 on 11/27/21 No mood swings.    03/08/22 appt noted: Increased lorazepam 1 mg HS and did better for awhile but trouble sleeping again.   So much on my mind trouble going to sleep.  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  Anxious dealing with renter.  A lot of stress. Can't do without the Ativan  only thing that saves me.  2-3 panic in last 3 mos.  Went to ER with panic and got panic and it stopped the last 2 panic.  She doesn't drive anymore. Taking Ativan 0.5 mg AM and 1 mg at 8 pm and to bed 9 pm.  Anxious and just wants to lay down. Under so much stress. No anger outbursts. Consistent with meds. Anxiety much worse with stress. Not falling.  05/28/22 appt noted: Not driving much.  Drove to church yesterday then got weak and shakey.  Just started back to church.  But gets nervous in a crowd.   Spending 12 hours daily in bed.  Sleeps 10 - 10.   Taking lorazepam 0.5 mg 4 times daily for anxiety and panic.  Can's sleep without it. Panic is triggered.  Chronic $ worries.  Needs to sell property but H can be oppositional about  that.   Not bad irritable. No SE TD sx are better and not shaking as much . Not always depressed but sometimes.   H's main concerns are her walking and her anxiety.  Says she worries too much. 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 She thinks she reduced oxcarbazepine to 300 mg daily. Still on paroxetine 30 mg daily for panic. Plan: DC oxcarbazapine DT low sodium and start Lybalvi 5 mg as alternative mood stabilizer which should also be much better with anxiety.    09/06/22 appt noted: Psych meds: Austedo 18 mg twice daily, lorazepam 0.5 mg twice daily and 1 mg at bedtime, Lybalvi 5 nightly, paroxetine 30 daily.  Off oxcarbazepine Doing good without complaints.  Not shaking as much unless misses lorazepam.   Paying $70 for Lybalvi.  Likes it.  Anxiety is pretty good. Mood about the same.  Still stay inside all the time.  Chronic worry but getting better about letting go and not get so upset. Sleep is fine. Plan: no med changes  11/28/22 appt noted: Psych meds as bove. Doing good.  Feeling fine.  Anxiety is better.  Not irritable.  Sleep is good.  12 h without napping. Movement disorder is "fine".   No SE with meds.   Health has been ok lately. Not able to do cruising now. Can't walk that well bc gives out. Annette Johnston is doing fine.  Past Psychiatric  Medication Trials: Depakote jerks, lithium tremor, Equetro good response, oxcarbazepine 600 partial response DC oxcarbazapine DT low sodium  risperidone side effects, Seroquel 300,  Latuda, [name], Saphris, Lybalvi 5  paroxetine 30 for panic,   sertraline,  [name] partial response Austedo 18 BID Lorazepam 0.5 3 times daily with good response  remote history of ECT,  Review of Systems:  Review of Systems  Constitutional:  Positive for fatigue.  Musculoskeletal:  Positive for arthralgias, gait problem and joint swelling. Negative for back pain.  Neurological:  Positive for tremors and weakness. Negative for dizziness.       Poor balance   Psychiatric/Behavioral:  Positive for decreased concentration. Negative for agitation, behavioral problems, confusion, dysphoric mood, hallucinations, self-injury, sleep disturbance and suicidal ideas. The patient is not nervous/anxious and is not hyperactive.     Medications: I have reviewed the patient's current medications.  Current Outpatient Medications  Medication Sig Dispense Refill   albuterol (VENTOLIN HFA) 108 (90 Base) MCG/ACT inhaler Inhale 2 puffs into the lungs every 4 (four) hours.     aspirin 81 MG EC tablet Take 81 mg by mouth daily.     carbidopa-levodopa (SINEMET IR) 25-100 MG tablet Take 2 tablets by mouth 3 (three) times daily.     Cholecalciferol (VITAMIN D) 50 MCG (2000 UT) CAPS Take 4,000 Units by mouth daily.     Deutetrabenazine (AUSTEDO) 9 MG TABS Take 18 mg by mouth in the morning and at bedtime.     diphenhydramine-acetaminophen (TYLENOL PM) 25-500 MG TABS tablet Take 2 tablets by mouth at bedtime.     Eluxadoline (VIBERZI) 100 MG TABS Take 1 tablet (100 mg total) by mouth 2 (two) times daily with a meal. 60 tablet 5   levothyroxine (SYNTHROID, LEVOTHROID) 137 MCG tablet Take 137 mcg by mouth daily before breakfast.     LORazepam (ATIVAN) 0.5 MG tablet 1 tablet twice daily and 2 at night. Can take 1 extra if needed for panic 130 tablet 3   OLANZapine-Samidorphan (LYBALVI) 5-10 MG TABS Take 1 tablet by mouth every evening. 30 tablet 3   PARoxetine (PAXIL) 30 MG tablet TAKE 1 TABLET BY MOUTH EVERY DAY 90 tablet 0   No current facility-administered medications for this visit.    Medication Side Effects: None  Allergies:  Allergies  Allergen Reactions   Latex Rash    Bandaids    Past Medical History:  Diagnosis Date   Anxiety    Bipolar 1 disorder    Depression    Dyspnea on exertion july 2011   Normal stress test dec 2010. Reportedl normal CXR 2011. CT abdomen lung cut 2005  - normal. Normal PFT - march 2012   Hypothyroidism    Obesity (BMI 30-39.9)     BMI 36 Feb 2012   [name]    Urticaria     Family History  Problem Relation Age of Onset   Alzheimer's disease Mother    Heart attack Father    Coronary artery disease Brother    Heart attack Brother    Alzheimer's disease Paternal Grandmother    Allergic rhinitis Neg Hx    Angioedema Neg Hx    Asthma Neg Hx    Atopy Neg Hx    Eczema Neg Hx    Immunodeficiency Neg Hx    Urticaria Neg Hx    Colon cancer Neg Hx     Social History   Socioeconomic History   Marital status: Married    Spouse name: Annette Johnston   Number  of children: 1   Years of education: Not on file   Highest education level: High school graduate  Occupational History   Occupation: retired  Tobacco Use   Smoking status: Never   Smokeless tobacco: Never  Vaping Use   Vaping Use: Never used  Substance and Sexual Activity   Alcohol use: Not Currently   Drug use: No   Sexual activity: Yes    Birth control/protection: Surgical  Other Topics Concern   Not on file  Social History Narrative   Caffeine 1 cup per day    Lives at home with husband    Right Handed.    Social Determinants of Health   Financial Resource Strain: Not on file  Food Insecurity: Not on file  Transportation Needs: Not on file  Physical Activity: Not on file  Stress: Not on file  Social Connections: Not on file  Intimate Partner Violence: Not on file    Past Medical History, Surgical history, Social history, and Family history were reviewed and updated as appropriate.   Please see review of systems for further details on the patient's review from today.   Objective:   Physical Exam:  There were no vitals taken for this visit.  Physical Exam Neurological:     Mental Status: She is alert and oriented to person, place, and time.     Cranial Nerves: No dysarthria.  Psychiatric:        Attention and Perception: Attention and perception normal.        Mood and Affect: Mood normal. Mood is not anxious or depressed.         Speech: Speech normal.        Behavior: Behavior is cooperative.        Thought Content: Thought content normal. Thought content is not paranoid or delusional. Thought content does not include homicidal or suicidal ideation. Thought content does not include suicidal plan.        Cognition and Memory: Cognition and memory normal.        Judgment: Judgment normal.     Comments: Insight intact Not irritable     Lab Review:     Component Value Date/Time   NA 129 (L) 11/27/2021 1441   NA 137 12/17/2017 1056   K 4.0 11/27/2021 1441   CL 97 (L) 11/27/2021 1441   CO2 22 11/27/2021 1441   GLUCOSE 97 11/27/2021 1441   BUN 11 11/27/2021 1441   BUN 15 12/17/2017 1056   CREATININE 0.50 11/27/2021 1441   CALCIUM 9.3 11/27/2021 1441   PROT 7.3 12/17/2017 1056   ALBUMIN 4.2 12/17/2017 1056   AST 30 12/17/2017 1056   ALT 26 12/17/2017 1056   ALKPHOS 77 12/17/2017 1056   BILITOT <0.2 12/17/2017 1056   GFRNONAA >60 11/27/2021 1441   GFRAA 104 12/17/2017 1056       Component Value Date/Time   WBC 7.2 11/27/2021 1441   RBC 4.48 11/27/2021 1441   HGB 13.7 11/27/2021 1441   HGB 13.1 12/17/2017 1056   HCT 40.2 11/27/2021 1441   HCT 38.2 12/17/2017 1056   PLT 308 11/27/2021 1441   PLT 329 12/17/2017 1056   MCV 89.7 11/27/2021 1441   MCV 90 12/17/2017 1056   MCH 30.6 11/27/2021 1441   MCHC 34.1 11/27/2021 1441   RDW 11.8 11/27/2021 1441   RDW 14.0 12/17/2017 1056   LYMPHSABS 1.6 12/17/2017 1056   MONOABS 0.5 06/06/2016 0959   EOSABS 0.3 12/17/2017 1056   BASOSABS 0.0  12/17/2017 1056    No results found for: "POCLITH", "LITHIUM"   No results found for: "PHENYTOIN", "PHENOBARB", "VALPROATE", "CBMZ"   Component 04/06/22 01/10/22 11/27/20 11/24/19 03/02/19 10/04/18  Sodium 129 Low  130 Low  134 Low  135 Low  129 Low   134 Low     .res Assessment: Plan:    Bipolar I disorder with depression  Panic disorder with agoraphobia  Generalized anxiety disorder  Insomnia due to mental  condition  Mild cognitive impairment  Tardive dyskinesia   Deconditioned still needs to work on this.  Discussed the importance of exercise  e discussed Ms. Encarnacion is a very complicated and difficult to treat bipolar patient with tardive dyskinesia.  It is complicated by the presence as well of mild cognitive impairment which appears stable..  In addition complicated by multiple med failures and very limited options for mood stabilization.    Not manic.  Mood is stable and she looks significantly less anxious and worried and distressed on the Lybalvi since starting that and stopping the oxcarbazepine oxcarbazepine was stopped due to hyponatremia.  She has not had a follow-up BMP in several months and needs to have that done to see if the hyponatremia is corrected.  Re; hyponatremia: Even though she has been on these medications a long time Re: psych meds, hyponatremia  is more likely related to paroxetine or oxcarbazepine than Austedo.  She is afraid of stopping paroxetine because she is found it helpful for her anxiety  now stable on  Lybalvi 5 mg as alternative mood stabilizer which should also be much better with anxiety.  Don't have samples of low dose Caplyta which is another option.  When this runs out will switch to olanzapine for cost reasons.  No sig options other than atypical.  Will use low dose in hopes of not worsening movement disorder. Rec check sodium level  she says she will make appt with Dr. Neita Carp.    Her tardive dyskinesia symptoms are   improved on 18 mg Austedo BID.  Now mostly resolved. resolved.  There are no worsening movement disorder problems since starting low-dose Lybalvi 5 mg Brain scan and neurology [facility] PD Jan 2023 and restarted Sinemet  Her TD were very difficult to control.  Mild facial sx resolved.   Fidgety is better. Continue Austedo to 18 mg BID bc sig residual TD. She tolerated it and it helped tongue facial TD. Disc risk increased EPS.  lorazepam  0.5 mg BID and 1 mg HS  and 1 extra if needed for panic in hopes of keeping her out of ER for panic continue paroxetine 30 mg daily for anxiety but this is not managing her anxiety as hoped.    Spending too much time in bed.  Needs to stay out of bed more.  Inactivity is causing the weakness.  Needs more exercise and rec walking. .Deconditioned and needs exercise to correct.  She fears falling.  This is no better and not doing housework.  We discussed the short-term risks associated with benzodiazepines including sedation and increased fall risk among others.  Discussed long-term side effect risk including dependence, potential withdrawal symptoms, and the potential eventual dose-related risk of dementia.  But recent studies from 2020 dispute this association between benzodiazepines and dementia risk. Newer studies in 2020 do not support an association with dementia.  Bruxism stopped.  Plan: Austedo 18 mg twice daily, lorazepam 0.5 mg twice daily and 1 mg at bedtime, Lybalvi 5 nightly, paroxetine 30  daily.   FU 4-52-month.  She needs to have in an office appointment so that we can reassess her TD symptoms.  Meredith Staggers, MD, DFAPA  Please see After Visit Summary for patient specific instructions.  No future appointments.    No orders of the defined types were placed in this encounter.      -------------------------------

## 2023-01-04 ENCOUNTER — Other Ambulatory Visit: Payer: Self-pay | Admitting: Psychiatry

## 2023-01-04 ENCOUNTER — Other Ambulatory Visit: Payer: Self-pay | Admitting: Gastroenterology

## 2023-01-04 DIAGNOSIS — F319 Bipolar disorder, unspecified: Secondary | ICD-10-CM

## 2023-01-04 DIAGNOSIS — R1013 Epigastric pain: Secondary | ICD-10-CM

## 2023-01-04 DIAGNOSIS — F411 Generalized anxiety disorder: Secondary | ICD-10-CM

## 2023-01-04 DIAGNOSIS — F4001 Agoraphobia with panic disorder: Secondary | ICD-10-CM

## 2023-01-17 ENCOUNTER — Other Ambulatory Visit: Payer: Self-pay | Admitting: Psychiatry

## 2023-04-18 ENCOUNTER — Other Ambulatory Visit: Payer: Self-pay | Admitting: Psychiatry

## 2023-04-18 DIAGNOSIS — F411 Generalized anxiety disorder: Secondary | ICD-10-CM

## 2023-04-18 DIAGNOSIS — F4001 Agoraphobia with panic disorder: Secondary | ICD-10-CM

## 2023-04-18 NOTE — Telephone Encounter (Signed)
Please call to schedule an appt, is due this month. Dr. Jennelle Human requests in office visit to assess TD.

## 2023-04-20 ENCOUNTER — Other Ambulatory Visit: Payer: Self-pay | Admitting: Gastroenterology

## 2023-04-20 DIAGNOSIS — K580 Irritable bowel syndrome with diarrhea: Secondary | ICD-10-CM

## 2023-04-26 NOTE — Telephone Encounter (Signed)
Lvm to schedule f/u

## 2023-05-01 ENCOUNTER — Other Ambulatory Visit: Payer: Self-pay | Admitting: Psychiatry

## 2023-05-01 DIAGNOSIS — F319 Bipolar disorder, unspecified: Secondary | ICD-10-CM

## 2023-05-01 DIAGNOSIS — F4001 Agoraphobia with panic disorder: Secondary | ICD-10-CM

## 2023-05-01 DIAGNOSIS — F411 Generalized anxiety disorder: Secondary | ICD-10-CM

## 2023-05-04 ENCOUNTER — Telehealth: Payer: Self-pay | Admitting: Psychiatry

## 2023-05-04 DIAGNOSIS — F5105 Insomnia due to other mental disorder: Secondary | ICD-10-CM

## 2023-05-05 NOTE — Telephone Encounter (Signed)
Please call to schedule FU, is past due.

## 2023-05-06 ENCOUNTER — Other Ambulatory Visit: Payer: Self-pay

## 2023-05-06 DIAGNOSIS — F411 Generalized anxiety disorder: Secondary | ICD-10-CM

## 2023-05-06 DIAGNOSIS — F4001 Agoraphobia with panic disorder: Secondary | ICD-10-CM

## 2023-05-06 DIAGNOSIS — F319 Bipolar disorder, unspecified: Secondary | ICD-10-CM

## 2023-05-06 MED ORDER — LYBALVI 5-10 MG PO TABS
5.0000 mg | ORAL_TABLET | Freq: Every evening | ORAL | 2 refills | Status: AC
Start: 2023-05-06 — End: ?

## 2023-05-06 NOTE — Telephone Encounter (Signed)
Rx refill sent.

## 2023-05-06 NOTE — Telephone Encounter (Signed)
[place] called at 12:00 to request refill of her Lybalvi.  Made appt for 10/1.  Send back to the pharmacy.

## 2023-05-14 ENCOUNTER — Ambulatory Visit: Payer: Medicare HMO | Admitting: Psychiatry

## 2023-05-14 ENCOUNTER — Encounter: Payer: Self-pay | Admitting: Psychiatry

## 2023-05-14 DIAGNOSIS — F4001 Agoraphobia with panic disorder: Secondary | ICD-10-CM | POA: Diagnosis not present

## 2023-05-14 DIAGNOSIS — F411 Generalized anxiety disorder: Secondary | ICD-10-CM | POA: Diagnosis not present

## 2023-05-14 DIAGNOSIS — F5105 Insomnia due to other mental disorder: Secondary | ICD-10-CM

## 2023-05-14 DIAGNOSIS — F319 Bipolar disorder, unspecified: Secondary | ICD-10-CM | POA: Diagnosis not present

## 2023-05-14 MED ORDER — LYBALVI 5-10 MG PO TABS
1.0000 | ORAL_TABLET | Freq: Every evening | ORAL | 3 refills | Status: DC
Start: 2023-05-14 — End: 2023-12-12

## 2023-05-14 MED ORDER — LORAZEPAM 0.5 MG PO TABS
0.5000 mg | ORAL_TABLET | Freq: Two times a day (BID) | ORAL | 1 refills | Status: DC | PRN
Start: 2023-05-14 — End: 2023-06-28

## 2023-05-14 NOTE — Progress Notes (Signed)
Annette Johnston [id number] July 30, 1946 77 y.o.   Virtual Visit via Telephone Note  I connected with pt by telephone and verified that I am speaking with the correct person using two identifiers.   I discussed the limitations, risks, security and privacy concerns of performing an evaluation and management service by telephone and the availability of in person appointments. I also discussed with the patient that there may be a patient responsible charge related to this service. The patient expressed understanding and agreed to proceed.  I discussed the assessment and treatment plan with the patient. The patient was provided an opportunity to ask questions and all were answered. The patient agreed with the plan and demonstrated an understanding of the instructions.   The patient was advised to call back or seek an in-person evaluation if the symptoms worsen or if the condition fails to improve as anticipated.  I provided 30 minutes of non-face-to-face time during this encounter. The call started at 1130 and ended at 1200. The patient was located at office and the provider was located home. Have asked her to come to office to make easier to evaluate TD   Subjective:   Patient ID:  Annette Johnston is a 77 y.o. (DOB 24-Apr-1946) female.  Chief Complaint:  No chief complaint on file.   Anxiety Symptoms include decreased concentration. Patient reports no confusion, dizziness, nervous/anxious behavior or suicidal ideas.    Depression        Associated symptoms include decreased concentration and fatigue.  Associated symptoms include no suicidal ideas.  Past medical history includes anxiety.     Annette Johnston presents to the office today for follow-up of recent worsening TD after stopping [name]  When seen October 23, 2018.  No med changes were made as of that date.  She had just gotten back on the [name] 120 mg a day which was helpful for her tardive dyskinesia symptoms.  At visit Dec 23, 2018.  No meds were changed.  She was spending too much time in bed and she was encouraged to get out of bed.  When seen April 27, 2019.  Because of racing thoughts oxcarbazepine was increased to 300 mg in the morning and 600 mg at night.  It helped. She was having significant residual tardive dyskinesia symptoms on [name] at a high dosage but she did not want to change medicines at that time.  However since that time she called and wanted to make the change to Austedo.  There have been multiple phone calls since September 14.  She called October 6 complaining that her jaw was locking up.  A trial of benztropine 0.5 mg twice daily was ineffective.  She called October 12 stating the tardive dyskinesia symptoms were worse.  There was some concern that perhaps Trileptal was inducing metabolism of [name].  We started the switch from [place] to [place].  She was given lorazepam 0.5 mg 4 times daily for anxiety.  As of October 21 she had increased Austedo to 12 mg twice daily and felt it was helpful.  When seen June 16, 2019 she was complaining of nausea.  Zofran was not effective.  She was given Phenergan.  There was a question as to whether it was related to Austedo.  She was also encouraged to stop meloxicam.  seen July 08, 2019.  The following changes were made: Increase carbidopa-levodopa to 2 in the morning and 1 in the afternoon and 1 in the evening for 4 days. If tolerated  but not helpful enough for balance then increase to 2 tablets 3 times daily. For anxiety add Ativan 1 tablet 3 times daily.  Doing better and less agitated with Ativan.  A lot of it was nerves.  Also less movement disorder with change in carbidopa-levodopa to 2 tablets 3 times daily.  Better balance.  Not in bed as much in the last 2 weeks.  I feel a big difference with the Ativan.  Less anxiety and panic.  seen July 29, 2019.  No meds were changed.  Overall she was satisfied with the meds at that  time.  seen September 29, 2019.  She was having some racing thoughts and oxcarbazepine 300 mg was increased to 1 in the morning and 2 at night.  She continued paroxetine 30 mg daily, lorazepam 0.5 mg 3 times daily to 4 times daily, Austedo 12 mg twice daily, Sinemet 25-100 mg 2 tablets twice daily.  She called April 14 complaining of severe panic and was given extra lorazepam.  She claims compliance with medications.  She was scheduled for an urgent appointment the following day.    As of November 26, 2019 she reports the following. Increased anxiety and panic.  Felt SOB, dizzy and called ER bc choking.  Another one yesterday.  ER workup was OK.  Given 1 mg Ativan to ER.  That calmed me down.  Still SOB and constantly grunting and tongue twirling.  Mild slurring.  Came on suddenly on Tuesday.  Called neuro but couldn't get appt until May. Balance is poor. Plenty of sleep. Not grunting at night. She is not having manic symptoms  Note she's been taking 1 of 30 mg paroxetine daily not 15 mg as indicated previously in the chart. Plan: For  anxiety increase oxcarbazepine to 1 in the morning and 2 at night of the 300 mg tablets. Anxiety is still bad.   Increase lorazepam 0.5 mg QID Continue Austedo 12 mg tablets, 1 twice daily.  Continue carbidopa levodopa 25-100 mg 2 tablets twice daily. Continue paroxetine  30 mg daily for anxiety.  12/29/2019 appointment, the following is noted: Started grunting when gets agitated.  Fidgety. Had panic after accidentally stopping Austedo for a week.  Better anxiety back on her meds.  Worsening tongue movements and sucking lip.   Nausea resolved when split up meds.  Upset hasn't seen daughter in 3 years and hasn't called last 2 mother's days.   No mania and depression is manageable.  Sleep is fine. Plan increase Austedo to 12 mg twice daily. Continue oxcarbazepine 300 mg in the morning and 600 mg nightly and lorazepam 0.5 mg 4 times daily and Sinemet 25-102 tablets  twice daily, Continue paroxetine  30 mg daily for anxiety.  02/02/2020 appointment with with the following noted: Knee surgery and done OK with it so far.  StartingPT. Doing really well with increase Austedo 18 BID.  Less slinging tongue and arms.  Less sucking lips.  Tolerated Austedo without a problem.   Mood is pretty good.  Still lay around a lot.   Thinks walking will get better gradually.   Anxiety is pretty good. Still fidgety but not that bothersome.    Not usually angry..   Tolerating all meds.   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 Plan: No med changes today  09/07/2020 appointment with following noted: Still doesn't want to do anything.  Shon Hale takes care of her.  Easily agitated.   Worries over  looking for another grant for Austedo bc costs $180/month and can't afford that. On Austedo 6+12 mg daily.  Will try changing to 9 mg 2 tablets BID and see if that's covered with 1 copayment which would make this more affordable. Plan: No med changes  05/10/2021 appointment with the following noted: Bad panic couple mos ago and couldn't breathe.  Called 911. Took Ativan 0.5 mg prn.   More trouble with sleep initially. Doing nothing.  Weak  from even shower.  Sit in recliner all day. Get up 10 AM and to bed at 9.   Neuro sending her for further eval of movement disorder. $ stress.  Needs to sell land but H opposing it. Plan no med changes  06/16/2021 phone call: She has low sodium down to 123 and PCP is wondering if it is her psych meds. MD response: Even though she has been on these medications a long time Re: psych meds, hyponatremia  is more likely related to paroxetine or oxcarbazepine than Austedo.  Sometimes these meds will cause low sodium levels even after many years of taking them medicines.  The only way to tell is to take her off of 1 at a time and see if the sodium level gets better.  She should stay in close contact with her primary care doctor and  make sure she follows their instructions.  She should ask them if she should take salt tablets.  However I think she needs to come off the paroxetine.  However cut the dose in half for 10 days and then stop it.  If she gets real dizzy or spacey from stopping it let us know and we will taper it more slowly.  She also needs to get an appointment with me and get on the cancellation list.  06/28/2021 appointment with the following noted: Seen today after recent diagnosis of hyponatremia. FU PCP tomorrow.   Went to [facility] and neuro took her off Sinemet last week.  Hasn't noticed a change off it so far. She's been drinking Gatorade and sodium is better with last 129 up from 123. She didn't reduce the paroxetine as instructed bc "I was afraid to"  for fear of worsening anxiety and depression. "Ministroke" about a month ago causing bladder problems.   No mood swings.  Depressed and doesn't want to go out like she used to.  No interest or motivation.  08/29/2021 appt noted: DX PD by neuro [facility] and started Sinemet Still some depression and not getting out of recliner much.  Will start PT Stayed on oxcarbazepine DT sodium "better" per Dr. Neita Carp. Weight gain bc was told to drink Gatorade for sodium.  10/27/21 appt noted: Larey Seat 09/19/21 and humerus fx.  Hasn't felt like riding in car.  Afraid of falls. Doing ok overall and nothing changed.   Regular doctor want to know about her meds.  Disc purpose of each med in detail. He told her to decrease oxcarbazepine to 1 twice daily but she doesn't know why and about 2 mos ago.  Maybe bc sodium level was low.  Sodium is better now. Taking Ativan 1mg  BID.  Anxiety about the same. Patient reports stable mood and denies depressed or irritable moods.  Patient denies any recent difficulty with anxiety.  Patient denies difficulty with sleep initiation or maintenance. Denies appetite disturbance.  Energy never very good.  Patient denies any difficulty with concentration.   Patient denies any suicidal ideation. No recent mania. Sodium 133. Plan: oxcarbazepine  1 in  the morning and 1 at night of the 300 mg tablets. Reduced apparently lorazepam 0.5 mg QID and continue paroxetine 30 mg daily for anxiety. Her TD was very difficult to control.  Mild facial sx. Fidgety. Continue Austedo to 18 mg BID bc sig residual TD. She tolerated it and it helped tongue facial TD.  11/30/2021 appointment with the following noted: Arm not healed. Not doing good.  # stressors.   Shaking bad bc hasn't taken meds.  Normally shakiness under control.   Continued weakness and not able to go anywhere except to doctor.   Mental health issues are OK.  Using cane or walker.   Doing PT for 2 mos but running out DT insurance. Low sodium lately.  129 on 11/27/21 No mood swings.    03/08/22 appt noted: Increased lorazepam 1 mg HS and did better for awhile but trouble sleeping again.   So much on my mind trouble going to sleep.  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  Anxious dealing with renter.  A lot of stress. Can't do without the Ativan  only thing that saves me.  2-3 panic in last 3 mos.  Went to ER with panic and got panic and it stopped the last 2 panic.  She doesn't drive anymore. Taking Ativan 0.5 mg AM and 1 mg at 8 pm and to bed 9 pm.  Anxious and just wants to lay down. Under so much stress. No anger outbursts. Consistent with meds. Anxiety much worse with stress. Not falling.  05/28/22 appt noted: Not driving much.  Drove to church yesterday then got weak and shakey.  Just started back to church.  But gets nervous in a crowd.   Spending 12 hours daily in bed.  Sleeps 10 - 10.   Taking lorazepam 0.5 mg 4 times daily for anxiety and panic.  Can's sleep without it. Panic is triggered.  Chronic $ worries.  Needs to sell property but H can be oppositional about that.   Not bad irritable. No SE TD sx are better and not shaking as much  . Not always depressed but sometimes.   H's main concerns are her walking and her anxiety.  Says she worries too much. 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 She thinks she reduced oxcarbazepine to 300 mg daily. Still on paroxetine 30 mg daily for panic. Plan: DC oxcarbazapine DT low sodium and start Lybalvi 5 mg as alternative mood stabilizer which should also be much better with anxiety.    09/06/22 appt noted: Psych meds: Austedo 18 mg twice daily, lorazepam 0.5 mg twice daily and 1 mg at bedtime, Lybalvi 5 nightly, paroxetine 30 daily.  Off oxcarbazepine Doing good without complaints.  Not shaking as much unless misses lorazepam.   Paying $70 for Lybalvi.  Likes it.  Anxiety is pretty good. Mood about the same.  Still stay inside all the time.  Chronic worry but getting better about letting go and not get so upset. Sleep is fine. Plan: no med changes  11/28/22 appt noted: Psych meds as bove. Doing good.  Feeling fine.  Anxiety is better.  Not irritable.  Sleep is good.  12 h without napping. Movement disorder is "fine".   No SE with meds.   Health has been ok lately. Not able to do cruising now. Can't walk that well bc gives out. Shon Hale is doing fine.  05/14/23 appt noted: Psych meds as above  was off Lybalvi for a week.  Didn't feel  any difference.   Weak and not doing much .  Not dizzy standing. No recent MD visist. Alright with mood stability, dep and anxiety.  No panic. Sleep 10-10. Some tremor .  TD managed by her report.   Past Psychiatric Medication Trials: Depakote jerks, lithium tremor, Equetro good response, oxcarbazepine 600 partial response DC oxcarbazapine DT low sodium  risperidone side effects, Seroquel 300,  Latuda, [name], Saphris, Lybalvi 5  paroxetine 30 for panic,   sertraline,  [name] partial response Austedo 18 BID Lorazepam 0.5 3 times daily with good response  remote history of ECT,  Review of Systems:  Review of Systems  Constitutional:  Positive for fatigue.   Musculoskeletal:  Positive for arthralgias, gait problem and joint swelling. Negative for back pain.  Neurological:  Positive for tremors and weakness. Negative for dizziness.       Poor balance  Psychiatric/Behavioral:  Positive for decreased concentration. Negative for agitation, behavioral problems, confusion, dysphoric mood, hallucinations, self-injury, sleep disturbance and suicidal ideas. The patient is not nervous/anxious and is not hyperactive.     Medications: I have reviewed the patient's current medications.  Current Outpatient Medications  Medication Sig Dispense Refill   albuterol (VENTOLIN HFA) 108 (90 Base) MCG/ACT inhaler Inhale 2 puffs into the lungs every 4 (four) hours.     aspirin 81 MG EC tablet Take 81 mg by mouth daily.     carbidopa-levodopa (SINEMET IR) 25-100 MG tablet Take 2 tablets by mouth 3 (three) times daily.     Cholecalciferol (VITAMIN D) 50 MCG (2000 UT) CAPS Take 4,000 Units by mouth daily.     Deutetrabenazine (AUSTEDO) 9 MG TABS TAKE 2 TABLETS BY MOUTH EVERY MORNING AND TAKE 2 TABLETS BY MOUTH EVERY NIGHT AT BEDTIME 120 tablet 5   diphenhydramine-acetaminophen (TYLENOL PM) 25-500 MG TABS tablet Take 2 tablets by mouth at bedtime.     Eluxadoline (VIBERZI) 100 MG TABS TAKE 1 TABLET (100 MG TOTAL) BY MOUTH 2 (TWO) TIMES DAILY WITH A MEAL. 60 tablet 5   levothyroxine (SYNTHROID, LEVOTHROID) 137 MCG tablet Take 137 mcg by mouth daily before breakfast.     LORazepam (ATIVAN) 0.5 MG tablet TAKE 1 TABLET BY MOUTH TWICE DAILY AND 2 TABLETS AT NIGHT. CAN TAKE 1 EXTRA TAB IF NEEDED FOR PANIC 130 tablet 0   OLANZapine-Samidorphan (LYBALVI) 5-10 MG TABS Take 5-10 mg by mouth every evening. TAKE 1 TABLET BY MOUTH EVERY DAY IN THE EVENING 30 tablet 2   PARoxetine (PAXIL) 30 MG tablet TAKE 1 TABLET BY MOUTH EVERY DAY 60 tablet 0   No current facility-administered medications for this visit.    Medication Side Effects: None  Allergies:  Allergies  Allergen  Reactions   Latex Rash    Bandaids    Past Medical History:  Diagnosis Date   Anxiety    Bipolar 1 disorder (HCC)    Depression    Dyspnea on exertion july 2011   Normal stress test dec 2010. Reportedl normal CXR 2011. CT abdomen lung cut 2005  - normal. Normal PFT - march 2012   Hypothyroidism    Obesity (BMI 30-39.9)    BMI 36 Feb 2012   [name]    Urticaria     Family History  Problem Relation Age of Onset   Alzheimer's disease Mother    Heart attack Father    Coronary artery disease Brother    Heart attack Brother    Alzheimer's disease Paternal Grandmother    Allergic rhinitis Neg Hx  Angioedema Neg Hx    Asthma Neg Hx    Atopy Neg Hx    Eczema Neg Hx    Immunodeficiency Neg Hx    Urticaria Neg Hx    Colon cancer Neg Hx     Social History   Socioeconomic History   Marital status: Married    Spouse name: Shon Hale   Number of children: 1   Years of education: Not on file   Highest education level: High school graduate  Occupational History   Occupation: retired  Tobacco Use   Smoking status: Never   Smokeless tobacco: Never  Vaping Use   Vaping status: Never Used  Substance and Sexual Activity   Alcohol use: Not Currently   Drug use: No   Sexual activity: Yes    Birth control/protection: Surgical  Other Topics Concern   Not on file  Social History Narrative   Caffeine 1 cup per day    Lives at home with husband    Right Handed.    Social Determinants of Health   Financial Resource Strain: Not on file  Food Insecurity: Not on file  Transportation Needs: Not on file  Physical Activity: Not on file  Stress: Not on file  Social Connections: Not on file  Intimate Partner Violence: Not At Risk (10/18/2021)   [facility], [facility]   Humiliation, Afraid, Rape, and Kick questionnaire    Fear of Current or Ex-Partner: No    Emotionally Abused: No    Physically Abused: No    Sexually Abused: No    Past Medical History,  Surgical history, Social history, and Family history were reviewed and updated as appropriate.   Please see review of systems for further details on the patient's review from today.   Objective:   Physical Exam:  There were no vitals taken for this visit.  Physical Exam Neurological:     Mental Status: She is alert and oriented to person, place, and time.     Cranial Nerves: No dysarthria.  Psychiatric:        Attention and Perception: Attention and perception normal.        Mood and Affect: Mood normal. Mood is not anxious or depressed.        Speech: Speech normal.        Behavior: Behavior is cooperative.        Thought Content: Thought content normal. Thought content is not paranoid or delusional. Thought content does not include homicidal or suicidal ideation. Thought content does not include suicidal plan.        Cognition and Memory: Cognition and memory normal.        Judgment: Judgment normal.     Comments: Insight intact Not irritable     Lab Review:     Component Value Date/Time   NA 129 (L) 11/27/2021 1441   NA 137 12/17/2017 1056   K 4.0 11/27/2021 1441   CL 97 (L) 11/27/2021 1441   CO2 22 11/27/2021 1441   GLUCOSE 97 11/27/2021 1441   BUN 11 11/27/2021 1441   BUN 15 12/17/2017 1056   CREATININE 0.50 11/27/2021 1441   CALCIUM 9.3 11/27/2021 1441   PROT 7.3 12/17/2017 1056   ALBUMIN 4.2 12/17/2017 1056   AST 30 12/17/2017 1056   ALT 26 12/17/2017 1056   ALKPHOS 77 12/17/2017 1056   BILITOT <0.2 12/17/2017 1056   GFRNONAA >60 11/27/2021 1441   GFRAA 104 12/17/2017 1056  Component Value Date/Time   WBC 7.2 11/27/2021 1441   RBC 4.48 11/27/2021 1441   HGB 13.7 11/27/2021 1441   HGB 13.1 12/17/2017 1056   HCT 40.2 11/27/2021 1441   HCT 38.2 12/17/2017 1056   PLT 308 11/27/2021 1441   PLT 329 12/17/2017 1056   MCV 89.7 11/27/2021 1441   MCV 90 12/17/2017 1056   MCH 30.6 11/27/2021 1441   MCHC 34.1 11/27/2021 1441   RDW 11.8 11/27/2021 1441    RDW 14.0 12/17/2017 1056   LYMPHSABS 1.6 12/17/2017 1056   MONOABS 0.5 06/06/2016 0959   EOSABS 0.3 12/17/2017 1056   BASOSABS 0.0 12/17/2017 1056    No results found for: "POCLITH", "LITHIUM"   No results found for: "PHENYTOIN", "PHENOBARB", "VALPROATE", "CBMZ"   Component 04/06/22 01/10/22 11/27/20 11/24/19 03/02/19 10/04/18  Sodium 129 Low  130 Low  134 Low  135 Low  129 Low   134 Low     .res Assessment: Plan:    No diagnosis found.   Deconditioned still needs to work on this.  Discussed the importance of exercise  e discussed Ms. Putnam is a very complicated and difficult to treat bipolar patient with tardive dyskinesia.  It is complicated by the presence as well of mild cognitive impairment which appears stable..  In addition complicated by multiple med failures and very limited options for mood stabilization.    Not manic.  Mood is stable and she looks significantly less anxious and worried and distressed on the Lybalvi since starting that and stopping the oxcarbazepine oxcarbazepine was stopped due to hyponatremia.  She has not had a follow-up BMP in several months and needs to have that done to see if the hyponatremia is corrected.  Re; hyponatremia: Even though she has been on these medications a long time Re: psych meds, hyponatremia  is more likely related to paroxetine or oxcarbazepine than Austedo.  She is afraid of stopping paroxetine because she is found it helpful for her anxiety She reports last check was normal but she is not checking it often enough.  now stable on  Lybalvi 5 mg as alternative mood stabilizer which should also be much better with anxiety.   No sig options other than atypical.  Will use low dose in hopes of not worsening movement disorder. Rec check sodium level  she says she will make appt with Dr. Neita Carp, but not clear she has done so.  Her tardive dyskinesia symptoms are  reportedly  improved on 18 mg Austedo BID.  Now mostly resolved There are  no worsening movement disorder problems since starting low-dose Lybalvi 5 mg.  She still reports some tremor Brain scan and neurology [facility] DX PD Jan 2023 and restarted Sinemet  Her TD were very difficult to control.  Mild facial sx resolved.   Fidgety is better. Continue Austedo to 18 mg BID bc sig residual TD. She tolerated it and it helped tongue facial TD. Disc risk increased EPS.  lorazepam 0.5 mg reduced to just HS continue paroxetine 30 mg daily for anxiety .  It is better now.  Spending too much time in bed.  Needs to stay out of bed more.  Inactivity is causing the weakness.  Needs more exercise and rec walking. .Deconditioned and needs exercise to correct.  She fears falling.  This is no better and not doing housework.  We discussed the short-term risks associated with benzodiazepines including sedation and increased fall risk among others.  Discussed long-term side effect risk including  dependence, potential withdrawal symptoms, and the potential eventual dose-related risk of dementia.  But recent studies from 2020 dispute this association between benzodiazepines and dementia risk. Newer studies in 2020 do not support an association with dementia.  Bruxism stopped.  Plan: no med changes, keep BZ low if possible. Austedo 18 mg twice daily, lorazepam 0.5 mg twice daily and 1 mg at bedtime, Lybalvi 5 nightly, paroxetine 30 daily.   FU 4-5-month.  She needs to have an in office appointment so that we can reassess her TD symptoms.  Meredith Staggers, MD, DFAPA  Please see After Visit Summary for patient specific instructions.  No future appointments.    No orders of the defined types were placed in this encounter.      -------------------------------

## 2023-05-22 ENCOUNTER — Other Ambulatory Visit: Payer: Self-pay | Admitting: Psychiatry

## 2023-05-22 DIAGNOSIS — F4001 Agoraphobia with panic disorder: Secondary | ICD-10-CM

## 2023-05-22 DIAGNOSIS — F411 Generalized anxiety disorder: Secondary | ICD-10-CM

## 2023-05-22 NOTE — Telephone Encounter (Signed)
Rf inappropriate, LF 04/18/23 . 0 rf.

## 2023-06-25 ENCOUNTER — Other Ambulatory Visit: Payer: Self-pay | Admitting: Psychiatry

## 2023-06-25 DIAGNOSIS — F4001 Agoraphobia with panic disorder: Secondary | ICD-10-CM

## 2023-06-25 DIAGNOSIS — F411 Generalized anxiety disorder: Secondary | ICD-10-CM

## 2023-06-25 NOTE — Telephone Encounter (Signed)
PT picked up rx on 11/8

## 2023-06-28 ENCOUNTER — Other Ambulatory Visit: Payer: Self-pay | Admitting: Psychiatry

## 2023-06-28 DIAGNOSIS — F5105 Insomnia due to other mental disorder: Secondary | ICD-10-CM

## 2023-06-28 NOTE — Telephone Encounter (Signed)
LF 09/23; LV 10/10 NV not scheduled due in 4-5 months

## 2023-07-26 ENCOUNTER — Other Ambulatory Visit: Payer: Self-pay | Admitting: Psychiatry

## 2023-07-26 DIAGNOSIS — F4001 Agoraphobia with panic disorder: Secondary | ICD-10-CM

## 2023-07-26 DIAGNOSIS — F411 Generalized anxiety disorder: Secondary | ICD-10-CM

## 2023-08-28 ENCOUNTER — Telehealth: Payer: Self-pay | Admitting: *Deleted

## 2023-08-28 NOTE — Telephone Encounter (Signed)
 Noted.

## 2023-08-28 NOTE — Telephone Encounter (Signed)
 Received letter from [employer] for Viberzi , denied coverage, because they new insurance information. Called and left message for pt to call office back.

## 2023-09-16 ENCOUNTER — Other Ambulatory Visit: Payer: Self-pay | Admitting: Psychiatry

## 2023-09-16 NOTE — Telephone Encounter (Signed)
Please schedule pt an appt. Lv 10/1 due back in March.

## 2023-09-22 ENCOUNTER — Telehealth: Payer: Self-pay

## 2023-09-22 NOTE — Telephone Encounter (Signed)
 Prior Authorization submitted and approved for Austedo  9 mg #120 for 30 days effective 09/22/23-09/21/24 with [employer] Part D

## 2023-10-02 NOTE — Telephone Encounter (Signed)
Spoke to pt at 11:57.  She broke her pelvis in 4 places and is in rehab, but she booked her appt for May (first available for [facility]).  She said he wants her to come in the office.

## 2023-10-26 ENCOUNTER — Other Ambulatory Visit: Payer: Self-pay | Admitting: Psychiatry

## 2023-10-26 DIAGNOSIS — F411 Generalized anxiety disorder: Secondary | ICD-10-CM

## 2023-10-26 DIAGNOSIS — F4001 Agoraphobia with panic disorder: Secondary | ICD-10-CM

## 2023-11-27 ENCOUNTER — Telehealth: Payer: Self-pay

## 2023-11-27 NOTE — Telephone Encounter (Signed)
 Pt is requesting a return call regarding her viberzi

## 2023-11-28 NOTE — Telephone Encounter (Signed)
 Spoke to pt, she states that insurance stop paying for [employer]. I called insurance company and they stated pt does not have coverage any more and she needs to call them to start coverage again. I left a message for pt to call the office and also to call [employer] [phone].

## 2023-12-03 NOTE — Telephone Encounter (Signed)
 Pt was made aware and stated that [facility] is sending over a PA request.

## 2023-12-04 NOTE — Telephone Encounter (Signed)
 Pt phoned today regarding a discussion she ws suppose to have with Courtney. Pt states she is sick and needs something done.

## 2023-12-10 NOTE — Telephone Encounter (Signed)
 Called [employer] insurance company they informed me that pt's coverage as lapsed and will not pay for Viberzi . I called pt and informed her that she needed to call [employer] and get her coverage started again. She stated she was going to call today.

## 2023-12-10 NOTE — Telephone Encounter (Signed)
 I have spoke to pt several times. I have asked her to bring or fax me the letter that she received from [employer].

## 2023-12-12 ENCOUNTER — Ambulatory Visit: Payer: Medicare HMO | Admitting: Psychiatry

## 2023-12-12 DIAGNOSIS — G2401 Drug induced subacute dyskinesia: Secondary | ICD-10-CM | POA: Diagnosis not present

## 2023-12-12 DIAGNOSIS — F319 Bipolar disorder, unspecified: Secondary | ICD-10-CM

## 2023-12-12 DIAGNOSIS — F411 Generalized anxiety disorder: Secondary | ICD-10-CM | POA: Diagnosis not present

## 2023-12-12 DIAGNOSIS — F4001 Agoraphobia with panic disorder: Secondary | ICD-10-CM | POA: Diagnosis not present

## 2023-12-12 DIAGNOSIS — F5105 Insomnia due to other mental disorder: Secondary | ICD-10-CM

## 2023-12-12 DIAGNOSIS — G3184 Mild cognitive impairment, so stated: Secondary | ICD-10-CM

## 2023-12-12 MED ORDER — LORAZEPAM 0.5 MG PO TABS
ORAL_TABLET | ORAL | 3 refills | Status: DC
Start: 2023-12-12 — End: 2024-01-21

## 2023-12-12 MED ORDER — PAROXETINE HCL 30 MG PO TABS
30.0000 mg | ORAL_TABLET | Freq: Every day | ORAL | 1 refills | Status: DC
Start: 2023-12-12 — End: 2024-04-14

## 2023-12-12 MED ORDER — LYBALVI 5-10 MG PO TABS: 1.0000 | ORAL_TABLET | Freq: Every evening | ORAL | 5 refills | Status: DC

## 2023-12-12 NOTE — Progress Notes (Unsigned)
 Annette Johnston [id number] Jun 30, 1946 78 y.o.     Subjective:   Patient ID:  Annette Johnston is a 78 y.o. (DOB [birth year]) female.  Chief Complaint:  Chief Complaint  Patient presents with  . Follow-up  . Depression  . Anxiety  . Fatigue     Jerrald Moose presents to the office today for follow-up of recent worsening TD after stopping [name]   When seen October 23, 2018.  No med changes were made as of that date.  She had just gotten back on the [name]  120 mg a day which was helpful for her tardive dyskinesia symptoms.  At visit Dec 23, 2018.  No meds were changed.  She was spending too much time in bed and she was encouraged to get out of bed.  When seen April 27, 2019.  Because of racing thoughts oxcarbazepine  was increased to 300 mg in the morning and 600 mg at night.  It helped. She was having significant residual tardive dyskinesia symptoms on [name]  at a high dosage but she did not want to change medicines at that time.  However since that time she called and wanted to make the change to Austedo .  There have been multiple phone calls since September 14.  She called October 6 complaining that her jaw was locking up.  A trial of benztropine  0.5 mg twice daily was ineffective.  She called October 12 stating the tardive dyskinesia symptoms were worse.  There was some concern that perhaps Trileptal  was inducing metabolism of [name] .  We started the switch from [name]  to Austedo .  She was given lorazepam  0.5 mg 4 times daily for anxiety.  As of October 21 she had increased Austedo  to 12 mg twice daily and felt it was helpful.  When seen June 16, 2019 she was complaining of nausea.  Zofran  was not effective.  She was given Phenergan .  There was a question as to whether it was related to Austedo .  She was also encouraged to stop meloxicam.  seen July 08, 2019.  The following changes were made: Increase carbidopa -levodopa  to 2 in the morning and 1 in the afternoon  and 1 in the evening for 4 days. If tolerated but not helpful enough for balance then increase to 2 tablets 3 times daily. For anxiety add Ativan  1 tablet 3 times daily.  Doing better and less agitated with Ativan .  A lot of it was nerves.  Also less movement disorder with change in carbidopa -levodopa  to 2 tablets 3 times daily.  Better balance.  Not in bed as much in the last 2 weeks.  I feel a big difference with the Ativan .  Less anxiety and panic.  seen July 29, 2019.  No meds were changed.  Overall she was satisfied with the meds at that time.  seen September 29, 2019.  She was having some racing thoughts and oxcarbazepine  300 mg was increased to 1 in the morning and 2 at night.  She continued paroxetine  30 mg daily, lorazepam  0.5 mg 3 times daily to 4 times daily, Austedo  12 mg twice daily, Sinemet  25-100 mg 2 tablets twice daily.  She called April 14 complaining of severe panic and was given extra lorazepam .  She claims compliance with medications.  She was scheduled for an urgent appointment the following day.    As of November 26, 2019 she reports the following. Increased anxiety and panic.  Felt SOB, dizzy and called ER bc choking.  Another one yesterday.  ER workup was  OK.  Given 1 mg Ativan  to ER.  That calmed me down.  Still SOB and constantly grunting and tongue twirling.  Mild slurring.  Came on suddenly on Tuesday.  Called neuro but couldn't get appt until May. Balance is poor. Plenty of sleep. Not grunting at night. She is not having manic symptoms  Note she's been taking 1 of 30 mg paroxetine  daily not 15 mg as indicated previously in the chart. Plan: For  anxiety increase oxcarbazepine  to 1 in the morning and 2 at night of the 300 mg tablets. Anxiety is still bad.   Increase lorazepam  0.5 mg QID Continue Austedo  12 mg tablets, 1 twice daily.  Continue carbidopa  levodopa  25-100 mg 2 tablets twice daily. Continue paroxetine   30 mg daily for anxiety.  12/29/2019 appointment, the  following is noted: Started grunting when gets agitated.  Fidgety. Had panic after accidentally stopping Austedo  for a week.  Better anxiety back on her meds.  Worsening tongue movements and sucking lip.   Nausea resolved when split up meds.  Upset hasn't seen daughter in 3 years and hasn't called last 2 mother's days.   No mania and depression is manageable.  Sleep is fine. Plan increase Austedo  to 12 mg twice daily. Continue oxcarbazepine  300 mg in the morning and 600 mg nightly and lorazepam  0.5 mg 4 times daily and Sinemet  25-102 tablets twice daily, Continue paroxetine   30 mg daily for anxiety.  02/02/2020 appointment with with the following noted: Knee surgery and done OK with it so far.  StartingPT. Doing really well with increase Austedo  18 BID.  Less slinging tongue and arms.  Less sucking lips.  Tolerated Austedo  without a problem.   Mood is pretty good.  Still lay around a lot.   Thinks walking will get better gradually.   Anxiety is pretty good. Still fidgety but not that bothersome.    Not usually angry..   Tolerating all meds.   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 Plan: No med changes today  09/07/2020 appointment with following noted: Still doesn't want to do anything.  Annette Johnston takes care of her.  Easily agitated.   Worries over looking for another grant for Austedo  bc costs $180/month and can't afford that. On Austedo  6+12 mg daily.  Will try changing to 9 mg 2 tablets BID and see if that's covered with 1 copayment which would make this more affordable. Plan: No med changes  05/10/2021 appointment with the following noted: Bad panic couple mos ago and couldn't breathe.  Called 911. Took Ativan  0.5 mg prn.   More trouble with sleep initially. Doing nothing.  Weak  from even shower.  Sit in recliner all day. Get up 10 AM and to bed at 9.   Neuro sending her for further eval of movement disorder. $ stress.  Needs to sell land but H opposing  it. Plan no med changes  06/16/2021 phone call: She has low sodium down to 123 and PCP is wondering if it is her psych meds. MD response: Even though she has been on these medications a long time Re: psych meds, hyponatremia  is more likely related to paroxetine  or oxcarbazepine  than Austedo .  Sometimes these meds will cause low sodium levels even after many years of taking them medicines.  The only way to tell is to take her off of 1 at a time and see if the sodium level gets better.  She should stay in close contact  with her primary care doctor and make sure she follows their instructions.  She should ask them if she should take salt tablets.  However I think she needs to come off the paroxetine .  However cut the dose in half for 10 days and then stop it.  If she gets real dizzy or spacey from stopping it let us  know and we will taper it more slowly.  She also needs to get an appointment with me and get on the cancellation list.  06/28/2021 appointment with the following noted: Seen today after recent diagnosis of hyponatremia. FU PCP tomorrow.   Went to [place] and neuro took her off Sinemet  last week.  Hasn't noticed a change off it so far. She's been drinking Gatorade and sodium is better with last 129 up from 123. She didn't reduce the paroxetine  as instructed bc "I was afraid to"  for fear of worsening anxiety and depression. "Ministroke" about a month ago causing bladder problems.   No mood swings.  Depressed and doesn't want to go out like she used to.  No interest or motivation.  08/29/2021 appt noted: DX PD by neuro [facility] and started Sinemet  Still some depression and not getting out of recliner much.  Will start PT Stayed on oxcarbazepine  DT sodium "better" per Dr. Marykay Snipes. Weight gain bc was told to drink Gatorade for sodium.  10/27/21 appt noted: Annette Johnston 09/19/21 and humerus fx.  Hasn't felt like riding in car.  Afraid of falls. Doing ok overall and nothing changed.   Regular doctor  want to know about her meds.  Disc purpose of each med in detail. He told her to decrease oxcarbazepine  to 1 twice daily but she doesn't know why and about 2 mos ago.  Maybe bc sodium level was low.  Sodium is better now. Taking Ativan  1mg  BID.  Anxiety about the same. Patient reports stable mood and denies depressed or irritable moods.  Patient denies any recent difficulty with anxiety.  Patient denies difficulty with sleep initiation or maintenance. Denies appetite disturbance.  Energy never very good.  Patient denies any difficulty with concentration.  Patient denies any suicidal ideation. No recent mania. Sodium 133. Plan: oxcarbazepine   1 in the morning and 1 at night of the 300 mg tablets. Reduced apparently lorazepam  0.5 mg QID and continue paroxetine  30 mg daily for anxiety. Her TD was very difficult to control.  Mild facial sx. Fidgety. Continue Austedo  to 18 mg BID bc sig residual TD. She tolerated it and it helped tongue facial TD.  11/30/2021 appointment with the following noted: Arm not healed. Not doing good.  # stressors.   Shaking bad bc hasn't taken meds.  Normally shakiness under control.   Continued weakness and not able to go anywhere except to doctor.   Mental health issues are OK.  Using cane or walker.   Doing PT for 2 mos but running out DT insurance. Low sodium lately.  129 on 11/27/21 No mood swings.    03/08/22 appt noted: Increased lorazepam  1 mg HS and did better for awhile but trouble sleeping again.   So much on my mind trouble going to sleep.  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  Anxious dealing with renter.  A lot of stress. Can't do without the Ativan   only thing that saves me.  2-3 panic in last 3 mos.  Went to ER with panic and got panic and it stopped the last 2 panic.  She doesn't  drive anymore. Taking Ativan  0.5 mg AM and 1 mg at 8 pm and to bed 9 pm.  Anxious and just wants to lay down. Under so much  stress. No anger outbursts. Consistent with meds. Anxiety much worse with stress. Not falling.  05/28/22 appt noted: Not driving much.  Drove to church yesterday then got weak and shakey.  Just started back to church.  But gets nervous in a crowd.   Spending 12 hours daily in bed.  Sleeps 10 - 10.   Taking lorazepam  0.5 mg 4 times daily for anxiety and panic.  Can's sleep without it. Panic is triggered.  Chronic $ worries.  Needs to sell property but H can be oppositional about that.   Not bad irritable. No SE TD sx are better and not shaking as much . Not always depressed but sometimes.   H's main concerns are her walking and her anxiety.  Says she worries too much. 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 She thinks she reduced oxcarbazepine  to 300 mg daily. Still on paroxetine  30 mg daily for panic. Plan: DC oxcarbazapine DT low sodium and start Lybalvi  5 mg as alternative mood stabilizer which should also be much better with anxiety.    09/06/22 appt noted: Psych meds: Austedo  18 mg twice daily, lorazepam  0.5 mg twice daily and 1 mg at bedtime, Lybalvi  5 nightly, paroxetine  30 daily.  Off oxcarbazepine  Doing good without complaints.  Not shaking as much unless misses lorazepam .   Paying $70 foLybalvi .  Likes it.  Anxiety is pretty good. Mood about the same.  Still stay inside all the time.  Chronic worry but getting better about letting go and not get so upset. Sleep is fine. Plan: no med changes  11/28/22 appt noted: Psych meds as bove. Doing good.  Feeling fine.  Anxiety is better.  Not irritable.  Sleep is good.  12 h without napping. Movement disorder is "fine".   No SE with meds.   Health has been ok lately. Not able to do cruising now. Can't walk that well bc gives out. Annette Johnston is doing fine.  05/14/23 appt noted: Psych meds as above  was off Lybalvi  for a week.  Didn't feel any difference.   Weak and not doing much .  Not dizzy standing. No recent MD visist. Alright with mood stability,  dep and anxiety.  No panic. Sleep 10-10. Some tremor .  TD managed by her report. Plan: Plan: no med changes, keep BZ low if possible. Austedo  18 mg twice daily, lorazepam  0.5 mg twice daily and 1 mg at bedtime, Lybalvi  5 nightly, paroxetine  30 daily.   12/12/23 appt noted:  in office Med: as above Dx PD Annette Johnston and broke pelvis Jan 12 and rehab 7 weeks. Had a time with pain but better now.  Did PT but not a lot stronger.   Overall doing fine re: mental health.  Not sig dep, manic, nor anxious.   Sleep still 12 hour daily. Getting OOB then.   No SE. H doing fine.  Retiring for good end of the month.   TD movements controlled also  Past Psychiatric Medication Trials: Depakote jerks, lithium tremor, Equetro good response, oxcarbazepine  600 partial response DC oxcarbazapine DT low sodium  risperidone side effects, Seroquel 300,  Latuda, [name], Saphris, Lybalvi  5  paroxetine  30 for panic,   sertraline,  [name]  partial response Austedo  18 BID Lorazepam  0.5 3 times daily with good response  remote history of ECT,  Review of Systems:  Review  of Systems  Constitutional:  Positive for fatigue.  Musculoskeletal:  Positive for arthralgias, gait problem and joint swelling. Negative for back pain.  Neurological:  Positive for tremors and weakness. Negative for dizziness.       Poor balance  Psychiatric/Behavioral:  Positive for decreased concentration. Negative for agitation, behavioral problems, confusion, dysphoric mood, hallucinations, self-injury, sleep disturbance and suicidal ideas. The patient is not nervous/anxious and is not hyperactive.     Medications: I have reviewed the patient's current medications.  Current Outpatient Medications  Medication Sig Dispense Refill  . albuterol (VENTOLIN HFA) 108 (90 Base) MCG/ACT inhaler Inhale 2 puffs into the lungs every 4 (four) hours.    Annette Johnston aspirin 81 MG EC tablet Take 81 mg by mouth daily.    . carbidopa -levodopa  (SINEMET  IR) 25-100 MG  tablet Take 2 tablets by mouth 3 (three) times daily.    . Cholecalciferol (VITAMIN D) 50 MCG (2000 UT) CAPS Take 4,000 Units by mouth daily.    . Deutetrabenazine  (AUSTEDO ) 9 MG TABS TAKE 2 TABLETS BY MOUTH EVERY MORNING AND TAKE 2 TABLETS BY MOUTH EVERY NIGHT AT BEDTIME 120 tablet 1  . diphenhydramine -acetaminophen  (TYLENOL  PM) 25-500 MG TABS tablet Take 2 tablets by mouth at bedtime.    . Eluxadoline  (VIBERZI ) 100 MG TABS TAKE 1 TABLET (100 MG TOTAL) BY MOUTH 2 (TWO) TIMES DAILY WITH A MEAL. 60 tablet 5  . levothyroxine (SYNTHROID, LEVOTHROID) 137 MCG tablet Take 137 mcg by mouth daily before breakfast.    . LORazepam  (ATIVAN ) 0.5 MG tablet One tablet 3 times daily and one daily prn anxiety 120 tablet 3  . OLANZapine-Samidorphan (LYBALVI ) 5-10 MG TABS Take 1 tablet by mouth every evening. 30 tablet 5  . PARoxetine  (PAXIL ) 30 MG tablet Take 1 tablet (30 mg total) by mouth daily. 90 tablet 1   No current facility-administered medications for this visit.    Medication Side Effects: None  Allergies:  Allergies  Allergen Reactions  . Latex Rash    Bandaids    Past Medical History:  Diagnosis Date  . Anxiety   . Bipolar 1 disorder (HCC)   . Depression   . Dyspnea on exertion july 2011   Normal stress test dec 2010. Reportedl normal CXR 2011. CT abdomen lung cut 2005  - normal. Normal PFT - march 2012  . Hypothyroidism   . Obesity (BMI 30-39.9)    BMI 36 Feb 2012  . [name] (HCC)   . Urticaria     Family History  Problem Relation Age of Onset  . Alzheimer's disease Mother   . Heart attack Father   . Coronary artery disease Brother   . Heart attack Brother   . Alzheimer's disease Paternal Grandmother   . Allergic rhinitis Neg Hx   . Angioedema Neg Hx   . Asthma Neg Hx   . Atopy Neg Hx   . Eczema Neg Hx   . Immunodeficiency Neg Hx   . Urticaria Neg Hx   . Colon cancer Neg Hx     Social History   Socioeconomic History  . Marital status: Married    Spouse name:  Annette Johnston  . Number of children: 1  . Years of education: Not on file  . Highest education level: High school graduate  Occupational History  . Occupation: retired  Tobacco Use  . Smoking status: Never  . Smokeless tobacco: Never  Vaping Use  . Vaping status: Never Used  Substance and Sexual Activity  . Alcohol  use: Not Currently  .  Drug use: No  . Sexual activity: Yes    Birth control/protection: Surgical  Other Topics Concern  . Not on file  Social History Narrative   Caffeine 1 cup per day    Lives at home with husband    Right Handed.    Social Drivers of Corporate investment banker Strain: Not on file  Food Insecurity: Not on file  Transportation Needs: Not on file  Physical Activity: Not on file  Stress: Not on file  Social Connections: Not on file  Intimate Partner Violence: Not At Risk (10/18/2021)   Received from [facility]   Humiliation, Afraid, Rape, and Kick questionnaire   . Fear of Current or Ex-Partner: No   . Emotionally Abused: No   . Physically Abused: No   . Sexually Abused: No    Past Medical History, Surgical history, Social history, and Family history were reviewed and updated as appropriate.   Please see review of systems for further details on the patient's review from today.   Objective:   Physical Exam:  There were no vitals taken for this visit.  Physical Exam Neurological:     Mental Status: She is alert and oriented to person, place, and time.     Cranial Nerves: No dysarthria.     Comments: Minimal mouth movements No unusual fidgitiness nor akathisia   Gait is ok  Psychiatric:        Attention and Perception: Attention and perception normal.        Mood and Affect: Mood normal. Mood is not anxious or depressed.        Speech: Speech normal.        Behavior: Behavior is cooperative.        Thought Content: Thought content normal. Thought content is not paranoid or delusional. Thought content does not include  homicidal or suicidal ideation. Thought content does not include suicidal plan.        Cognition and Memory: Cognition and memory normal.        Judgment: Judgment normal.     Comments: Insight intact Not irritable    Lab Review:     Component Value Date/Time   NA 129 (L) 11/27/2021 1441   NA 137 12/17/2017 1056   K 4.0 11/27/2021 1441   CL 97 (L) 11/27/2021 1441   CO2 22 11/27/2021 1441   GLUCOSE 97 11/27/2021 1441   BUN 11 11/27/2021 1441   BUN 15 12/17/2017 1056   CREATININE 0.50 11/27/2021 1441   CALCIUM 9.3 11/27/2021 1441   PROT 7.3 12/17/2017 1056   ALBUMIN 4.2 12/17/2017 1056   AST 30 12/17/2017 1056   ALT 26 12/17/2017 1056   ALKPHOS 77 12/17/2017 1056   BILITOT <0.2 12/17/2017 1056   GFRNONAA >60 11/27/2021 1441   GFRAA 104 12/17/2017 1056       Component Value Date/Time   WBC 7.2 11/27/2021 1441   RBC 4.48 11/27/2021 1441   HGB 13.7 11/27/2021 1441   HGB 13.1 12/17/2017 1056   HCT 40.2 11/27/2021 1441   HCT 38.2 12/17/2017 1056   PLT 308 11/27/2021 1441   PLT 329 12/17/2017 1056   MCV 89.7 11/27/2021 1441   MCV 90 12/17/2017 1056   MCH 30.6 11/27/2021 1441   MCHC 34.1 11/27/2021 1441   RDW 11.8 11/27/2021 1441   RDW 14.0 12/17/2017 1056   LYMPHSABS 1.6 12/17/2017 1056   MONOABS 0.5 06/06/2016 0959   EOSABS 0.3 12/17/2017 1056  BASOSABS 0.0 12/17/2017 1056    No results found for: "POCLITH", "LITHIUM"   No results found for: "PHENYTOIN", "PHENOBARB", "VALPROATE", "CBMZ"   Component 04/06/22 01/10/22 11/27/20 11/24/19 03/02/19 10/04/18  Sodium 129 Low  130 Low  134 Low  135 Low  129 Low   134 Low     .res Assessment: Plan:    Bipolar I disorder with depression (HCC) - Plan: OLANZapine-Samidorphan (LYBALVI ) 5-10 MG TABS  Generalized anxiety disorder - Plan: PARoxetine  (PAXIL ) 30 MG tablet, OLANZapine-Samidorphan (LYBALVI ) 5-10 MG TABS  Panic disorder with agoraphobia - Plan: PARoxetine  (PAXIL ) 30 MG tablet, OLANZapine-Samidorphan (LYBALVI )  5-10 MG TABS  Tardive dyskinesia  Mild cognitive impairment  Insomnia due to mental condition - Plan: LORazepam  (ATIVAN ) 0.5 MG tablet   Deconditioned still needs to work on this.  Discussed the importance of exercise  e discussed Ms. Hartzell is a very complicated and difficult to treat bipolar patient with tardive dyskinesia.  It is complicated by the presence as well of mild cognitive impairment which appears stable..  In addition complicated by multiple med failures and very limited options for mood stabilization.    Not manic.  Mood is stable and she looks significantly less anxious and worried and distressed on the Lybalvi  since starting that and stopping the oxcarbazepine  oxcarbazepine  was stopped due to hyponatremia.  She has not had a follow-up BMP in several months and needs to have that done to see if the hyponatremia is corrected.  Re; hyponatremia: Even though she has been on these medications a long time Re: psych meds, hyponatremia  is more likely related to paroxetine  or oxcarbazepine  than Austedo .  She is afraid of stopping paroxetine  because she is found it helpful for her anxiety She reports last check was normal but she is not checking it often enough.  now stable on  Lybalvi  5 mg as alternative mood stabilizer which should also be much better with anxiety.   No sig options other than atypical.  Will use low dose in hopes of not worsening movement disorder. Rec check sodium level  she says she will make appt with Dr. Marykay Snipes, but not clear she has done so.  Her tardive dyskinesia symptoms are  reportedly  improved on 18 mg Austedo  BID.  Now mostly resolved There are no worsening movement disorder problems since starting low-dose Lybalvi  5 mg.  She still reports some tremor Brain scan and neurology [facility] DX PD Jan 2023 and restarted Sinemet   Her TD were very difficult to control.  Mild facial sx resolved.   Fidgety is better. Continue Austedo  to 18 mg BID bc sig residual TD.  She tolerated it and it helped tongue facial TD. Disc risk increased EPS.  lorazepam  0.5 mg reduced to just HS and prn anxiety continue paroxetine  30 mg daily for anxiety .  It is better now.  Spending too much time in bed.  Needs to stay out of bed more.  Inactivity is causing the weakness.  Needs more exercise and rec walking. .Deconditioned and needs exercise to correct.  She fears falling.  This is no better and not doing housework.  We discussed the short-term risks associated with benzodiazepines including sedation and increased fall risk among others.  Discussed long-term side effect risk including dependence, potential withdrawal symptoms, and the potential eventual dose-related risk of dementia.  But recent studies from 2020 dispute this association between benzodiazepines and dementia risk. Newer studies in 2020 do not support an association with dementia.  Bruxism stopped.  Plan: no med changes, keep BZ low if possible. Austedo  18 mg twice daily, lorazepam  0.5 mg twice daily and 1 mg at bedtime, Lybalvi  5 nightly, paroxetine  30 daily.   FU 24-month.    Nori Beat, MD, DFAPA  Please see After Visit Summary for patient specific instructions.  No future appointments.     No orders of the defined types were placed in this encounter.      -------------------------------

## 2023-12-17 ENCOUNTER — Encounter: Payer: Self-pay | Admitting: Psychiatry

## 2023-12-20 ENCOUNTER — Other Ambulatory Visit: Payer: Self-pay | Admitting: Psychiatry

## 2023-12-23 NOTE — Telephone Encounter (Signed)
 Pt lvm to check on the status of this refill . She has been out a week of the medicine

## 2023-12-30 NOTE — Telephone Encounter (Signed)
 Pt [facility]  was approved. Letter scanned in chart. Informed pt.

## 2024-01-21 ENCOUNTER — Other Ambulatory Visit: Payer: Self-pay

## 2024-01-21 ENCOUNTER — Telehealth: Payer: Self-pay | Admitting: Psychiatry

## 2024-01-21 DIAGNOSIS — F5105 Insomnia due to other mental disorder: Secondary | ICD-10-CM

## 2024-01-21 MED ORDER — LORAZEPAM 0.5 MG PO TABS: ORAL_TABLET | ORAL | 2 refills | Status: DC

## 2024-01-21 NOTE — Telephone Encounter (Signed)
 LF 5/19, due 6/16

## 2024-01-21 NOTE — Telephone Encounter (Signed)
 Pt lvm 5:14 pm requesting early refill  Lorazepam . Leaving  Thursday for 2 week vacation. Will be back 6/27. RF due 6/15. Pharmacy needs early approval. RTC to pt @ [phone]   Apt 11/3

## 2024-01-21 NOTE — Telephone Encounter (Signed)
 Patient is due for lorazepam  6/16, wants to pick up 6/11. Leaving 6/12 for 2 week vacation. Will call the pharmacy as appropriate.

## 2024-01-21 NOTE — Telephone Encounter (Signed)
 Pended new Rx for lorazepam  for fill 6/11.

## 2024-01-21 NOTE — Telephone Encounter (Signed)
 Yes.  Please allow early refill for vacation

## 2024-01-22 ENCOUNTER — Other Ambulatory Visit: Payer: Self-pay | Admitting: Gastroenterology

## 2024-01-22 DIAGNOSIS — K580 Irritable bowel syndrome with diarrhea: Secondary | ICD-10-CM

## 2024-01-22 MED ORDER — VIBERZI 100 MG PO TABS: 1.0000 | ORAL_TABLET | Freq: Two times a day (BID) | ORAL | 5 refills | Status: DC

## 2024-03-28 ENCOUNTER — Other Ambulatory Visit: Payer: Self-pay | Admitting: Gastroenterology

## 2024-03-28 DIAGNOSIS — K580 Irritable bowel syndrome with diarrhea: Secondary | ICD-10-CM

## 2024-04-13 ENCOUNTER — Other Ambulatory Visit: Payer: Self-pay | Admitting: Psychiatry

## 2024-04-13 DIAGNOSIS — F4001 Agoraphobia with panic disorder: Secondary | ICD-10-CM

## 2024-04-13 DIAGNOSIS — F411 Generalized anxiety disorder: Secondary | ICD-10-CM

## 2024-06-15 ENCOUNTER — Ambulatory Visit: Admitting: Psychiatry

## 2024-06-15 ENCOUNTER — Encounter: Payer: Self-pay | Admitting: Psychiatry

## 2024-06-15 DIAGNOSIS — F411 Generalized anxiety disorder: Secondary | ICD-10-CM | POA: Diagnosis not present

## 2024-06-15 DIAGNOSIS — F4001 Agoraphobia with panic disorder: Secondary | ICD-10-CM | POA: Diagnosis not present

## 2024-06-15 DIAGNOSIS — F319 Bipolar disorder, unspecified: Secondary | ICD-10-CM

## 2024-06-15 DIAGNOSIS — G2401 Drug induced subacute dyskinesia: Secondary | ICD-10-CM | POA: Diagnosis not present

## 2024-06-15 DIAGNOSIS — F5105 Insomnia due to other mental disorder: Secondary | ICD-10-CM

## 2024-06-15 DIAGNOSIS — G3184 Mild cognitive impairment, so stated: Secondary | ICD-10-CM

## 2024-06-15 MED ORDER — AUSTEDO 9 MG PO TABS: 18.0000 mg | ORAL_TABLET | Freq: Two times a day (BID) | ORAL | 5 refills | Status: AC

## 2024-06-15 NOTE — Progress Notes (Signed)
 Annette Johnston [id number] [birth year] 78 y.o.     Subjective:   Patient ID:  Annette Johnston is a 78 y.o. (DOB 1946/06/17) female.  Chief Complaint:  Chief Complaint  Patient presents with   Follow-up   Depression   Anxiety   Medication Problem   Medication Reaction     Annette Johnston presents to the office today for follow-up of recent worsening TD after stopping [name]   When seen October 23, 2018.  No med changes were made as of that date.  She had just gotten back on the [name]  120 mg a day which was helpful for her tardive dyskinesia symptoms.  At visit Dec 23, 2018.  No meds were changed.  She was spending too much time in bed and she was encouraged to get out of bed.  When seen April 27, 2019.  Because of racing thoughts oxcarbazepine  was increased to 300 mg in the morning and 600 mg at night.  It helped. She was having significant residual tardive dyskinesia symptoms on [name]  at a high dosage but she did not want to change medicines at that time.  However since that time she called and wanted to make the change to Austedo .  There have been multiple phone calls since September 14.  She called October 6 complaining that her jaw was locking up.  A trial of benztropine  0.5 mg twice daily was ineffective.  She called October 12 stating the tardive dyskinesia symptoms were worse.  There was some concern that perhaps Trileptal  was inducing metabolism of [name] .  We started the switch from [name]  to Austedo .  She was given lorazepam  0.5 mg 4 times daily for anxiety.  As of October 21 she had increased Austedo  to 12 mg twice daily and felt it was helpful.  When seen June 16, 2019 she was complaining of nausea.  Zofran  was not effective.  She was given Phenergan .  There was a question as to whether it was related to Austedo .  She was also encouraged to stop meloxicam.  seen July 08, 2019.  The following changes were made: Increase carbidopa -levodopa  to 2 in the  morning and 1 in the afternoon and 1 in the evening for 4 days. If tolerated but not helpful enough for balance then increase to 2 tablets 3 times daily. For anxiety add Ativan  1 tablet 3 times daily.  Doing better and less agitated with Ativan .  A lot of it was nerves.  Also less movement disorder with change in carbidopa -levodopa  to 2 tablets 3 times daily.  Better balance.  Not in bed as much in the last 2 weeks.  I feel a big difference with the Ativan .  Less anxiety and panic.  seen July 29, 2019.  No meds were changed.  Overall she was satisfied with the meds at that time.  seen September 29, 2019.  She was having some racing thoughts and oxcarbazepine  300 mg was increased to 1 in the morning and 2 at night.  She continued paroxetine  30 mg daily, lorazepam  0.5 mg 3 times daily to 4 times daily, Austedo  12 mg twice daily, Sinemet  25-100 mg 2 tablets twice daily.  She called April 14 complaining of severe panic and was given extra lorazepam .  She claims compliance with medications.  She was scheduled for an urgent appointment the following day.    As of November 26, 2019 she reports the following. Increased anxiety and panic.  Felt SOB, dizzy and called ER bc choking.  Another one  yesterday.  ER workup was OK.  Given 1 mg Ativan  to ER.  That calmed me down.  Still SOB and constantly grunting and tongue twirling.  Mild slurring.  Came on suddenly on Tuesday.  Called neuro but couldn't get appt until May. Balance is poor. Plenty of sleep. Not grunting at night. She is not having manic symptoms  Note she's been taking 1 of 30 mg paroxetine  daily not 15 mg as indicated previously in the chart. Plan: For  anxiety increase oxcarbazepine  to 1 in the morning and 2 at night of the 300 mg tablets. Anxiety is still bad.   Increase lorazepam  0.5 mg QID Continue Austedo  12 mg tablets, 1 twice daily.  Continue carbidopa  levodopa  25-100 mg 2 tablets twice daily. Continue paroxetine   30 mg daily for  anxiety.  12/29/2019 appointment, the following is noted: Started grunting when gets agitated.  Fidgety. Had panic after accidentally stopping Austedo  for a week.  Better anxiety back on her meds.  Worsening tongue movements and sucking lip.   Nausea resolved when split up meds.  Upset hasn't seen daughter in 3 years and hasn't called last 2 mother's days.   No mania and depression is manageable.  Sleep is fine. Plan increase Austedo  to 12 mg twice daily. Continue oxcarbazepine  300 mg in the morning and 600 mg nightly and lorazepam  0.5 mg 4 times daily and Sinemet  25-102 tablets twice daily, Continue paroxetine   30 mg daily for anxiety.  02/02/2020 appointment with with the following noted: Knee surgery and done OK with it so far.  StartingPT. Doing really well with increase Austedo  18 BID.  Less slinging tongue and arms.  Less sucking lips.  Tolerated Austedo  without a problem.   Mood is pretty good.  Still lay around a lot.   Thinks walking will get better gradually.   Anxiety is pretty good. Still fidgety but not that bothersome.    Not usually angry..   Tolerating all meds.   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 Plan: No med changes today  09/07/2020 appointment with following noted: Still doesn't want to do anything.  Annette Johnston takes care of her.  Easily agitated.   Worries over looking for another grant for Austedo  bc costs $180/month and can't afford that. On Austedo  6+12 mg daily.  Will try changing to 9 mg 2 tablets BID and see if that's covered with 1 copayment which would make this more affordable. Plan: No med changes  05/10/2021 appointment with the following noted: Bad panic couple mos ago and couldn't breathe.  Called 911. Took Ativan  0.5 mg prn.   More trouble with sleep initially. Doing nothing.  Weak  from even shower.  Sit in recliner all day. Get up 10 AM and to bed at 9.   Neuro sending her for further eval of movement disorder. $ stress.   Needs to sell land but H opposing it. Plan no med changes  06/16/2021 phone call: She has low sodium down to 123 and PCP is wondering if it is her psych meds. MD response: Even though she has been on these medications a long time Re: psych meds, hyponatremia  is more likely related to paroxetine  or oxcarbazepine  than Austedo .  Sometimes these meds will cause low sodium levels even after many years of taking them medicines.  The only way to tell is to take her off of 1 at a time and see if the sodium level gets better.  She  should stay in close contact with her primary care doctor and make sure she follows their instructions.  She should ask them if she should take salt tablets.  However I think she needs to come off the paroxetine .  However cut the dose in half for 10 days and then stop it.  If she gets real dizzy or spacey from stopping it let us  know and we will taper it more slowly.  She also needs to get an appointment with me and get on the cancellation list.  06/28/2021 appointment with the following noted: Seen today after recent diagnosis of hyponatremia. FU PCP tomorrow.   Went to [facility] and neuro took her off Sinemet  last week.  Hasn't noticed a change off it so far. She's been drinking Gatorade and sodium is better with last 129 up from 123. She didn't reduce the paroxetine  as instructed bc I was afraid to  for fear of worsening anxiety and depression. Ministroke about a month ago causing bladder problems.   No mood swings.  Depressed and doesn't want to go out like she used to.  No interest or motivation.  08/29/2021 appt noted: DX PD by neuro [facility] and started Sinemet  Still some depression and not getting out of recliner much.  Will start PT Stayed on oxcarbazepine  DT sodium better per Dr. Atilano. Weight gain bc was told to drink Gatorade for sodium.  10/27/21 appt noted: [facility] 09/19/21 and humerus fx.  Hasn't felt like riding in car.  Afraid of falls. Doing ok overall and  nothing changed.   Regular doctor want to know about her meds.  Disc purpose of each med in detail. He told her to decrease oxcarbazepine  to 1 twice daily but she doesn't know why and about 2 mos ago.  Maybe bc sodium level was low.  Sodium is better now. Taking Ativan  1mg  BID.  Anxiety about the same. Patient reports stable mood and denies depressed or irritable moods.  Patient denies any recent difficulty with anxiety.  Patient denies difficulty with sleep initiation or maintenance. Denies appetite disturbance.  Energy never very good.  Patient denies any difficulty with concentration.  Patient denies any suicidal ideation. No recent mania. Sodium 133. Plan: oxcarbazepine   1 in the morning and 1 at night of the 300 mg tablets. Reduced apparently lorazepam  0.5 mg QID and continue paroxetine  30 mg daily for anxiety. Her TD was very difficult to control.  Mild facial sx. Fidgety. Continue Austedo  to 18 mg BID bc sig residual TD. She tolerated it and it helped tongue facial TD.  11/30/2021 appointment with the following noted: Arm not healed. Not doing good.  # stressors.   Shaking bad bc hasn't taken meds.  Normally shakiness under control.   Continued weakness and not able to go anywhere except to doctor.   Mental health issues are OK.  Using cane or walker.   Doing PT for 2 mos but running out DT insurance. Low sodium lately.  129 on 11/27/21 No mood swings.    03/08/22 appt noted: Increased lorazepam  1 mg HS and did better for awhile but trouble sleeping again.   So much on my mind trouble going to sleep.  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  Anxious dealing with renter.  A lot of stress. Can't do without the Ativan   only thing that saves me.  2-3 panic in last 3 mos.  Went to ER with panic and got panic and it stopped the last  2 panic.  She doesn't drive anymore. Taking Ativan  0.5 mg AM and 1 mg at 8 pm and to bed 9 pm.  Anxious and just wants  to lay down. Under so much stress. No anger outbursts. Consistent with meds. Anxiety much worse with stress. Not falling.  05/28/22 appt noted: Not driving much.  Drove to church yesterday then got weak and shakey.  Just started back to church.  But gets nervous in a crowd.   Spending 12 hours daily in bed.  Sleeps 10 - 10.   Taking lorazepam  0.5 mg 4 times daily for anxiety and panic.  Can's sleep without it. Panic is triggered.  Chronic $ worries.  Needs to sell property but H can be oppositional about that.   Not bad irritable. No SE TD sx are better and not shaking as much . Not always depressed but sometimes.   H's main concerns are her walking and her anxiety.  Says she worries too much. 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 She thinks she reduced oxcarbazepine  to 300 mg daily. Still on paroxetine  30 mg daily for panic. Plan: DC oxcarbazapine DT low sodium and start Lybalvi  5 mg as alternative mood stabilizer which should also be much better with anxiety.    09/06/22 appt noted: Psych meds: Austedo  18 mg twice daily, lorazepam  0.5 mg twice daily and 1 mg at bedtime, Lybalvi  5 nightly, paroxetine  30 daily.  Off oxcarbazepine  Doing good without complaints.  Not shaking as much unless misses lorazepam .   Paying $70 foLybalvi .  Likes it.  Anxiety is pretty good. Mood about the same.  Still stay inside all the time.  Chronic worry but getting better about letting go and not get so upset. Sleep is fine. Plan: no med changes  11/28/22 appt noted: Psych meds as bove. Doing good.  Feeling fine.  Anxiety is better.  Not irritable.  Sleep is good.  12 h without napping. Movement disorder is fine.   No SE with meds.   Health has been ok lately. Not able to do cruising now. Can't walk that well bc gives out. Annette Johnston is doing fine.  05/14/23 appt noted: Psych meds as above  was off Lybalvi  for a week.  Didn't feel any difference.   Weak and not doing much .  Not dizzy standing. No recent MD  visist. Alright with mood stability, dep and anxiety.  No panic. Sleep 10-10. Some tremor .  TD managed by her report. Plan: Plan: no med changes, keep BZ low if possible. Austedo  18 mg twice daily, lorazepam  0.5 mg twice daily and 1 mg at bedtime, Lybalvi  5 nightly, paroxetine  30 daily.   12/12/23 appt noted:  in office Med: as above Dx PD Clemens and broke pelvis Jan 12 and rehab 7 weeks. Had a time with pain but better now.  Did PT but not a lot stronger.   Overall doing fine re: mental health.  Not sig dep, manic, nor anxious.   Sleep still 12 hour daily. Getting OOB then.   No SE. H doing fine.  Retiring for good end of the month.   TD movements controlled also Plan no changes  06/15/24 appt noted:  Med: Austedo  18 mg twice daily, lorazepam  0.5 mg twice daily and 1 mg at bedtime, Lybalvi  5 nightly, paroxetine  30 daily.  Doing ok with mood but shaking arms and legs as usual.  Not uncomfortable nor painful. No sig dep, mood swings , nor panic.  Sleep is good and fine. No  more falls.   No SE. No sig mouth movements.     Past Psychiatric Medication Trials: Depakote jerks, lithium tremor, Equetro good response, oxcarbazepine  600 partial response DC oxcarbazapine DT low sodium  risperidone side effects, Seroquel 300,  Latuda, [name], Saphris, Lybalvi  5  paroxetine  30 for panic,   sertraline,  [name]  partial response Austedo  18 BID Lorazepam  0.5 3 times daily with good response  remote history of ECT,  Review of Systems:  Review of Systems  Constitutional:  Positive for fatigue.  Musculoskeletal:  Positive for arthralgias, gait problem and joint swelling. Negative for back pain.  Neurological:  Positive for tremors. Negative for dizziness and weakness.       Poor balance  Psychiatric/Behavioral:  Positive for decreased concentration. Negative for agitation, behavioral problems, confusion, dysphoric mood, hallucinations, self-injury, sleep disturbance and suicidal ideas. The  patient is not nervous/anxious and is not hyperactive.     Medications: I have reviewed the patient's current medications.  Current Outpatient Medications  Medication Sig Dispense Refill   albuterol (VENTOLIN HFA) 108 (90 Base) MCG/ACT inhaler Inhale 2 puffs into the lungs every 4 (four) hours.     aspirin 81 MG EC tablet Take 81 mg by mouth daily.     carbidopa -levodopa  (SINEMET  IR) 25-100 MG tablet Take 2 tablets by mouth 3 (three) times daily.     Cholecalciferol (VITAMIN D) 50 MCG (2000 UT) CAPS Take 4,000 Units by mouth daily.     Deutetrabenazine  (AUSTEDO ) 9 MG TABS TAKE 2 TABLETS BY MOUTH EVERY MORNING AND TAKE 2 TABLETS BY MOUTH EVERY NIGHT AT BEDTIME 120 tablet 5   diphenhydramine -acetaminophen  (TYLENOL  PM) 25-500 MG TABS tablet Take 2 tablets by mouth at bedtime.     Eluxadoline  (VIBERZI ) 100 MG TABS TAKE 1 TABLET (100 MG TOTAL) BY MOUTH 2 (TWO) TIMES DAILY WITH A MEAL. 60 tablet 3   levothyroxine (SYNTHROID, LEVOTHROID) 137 MCG tablet Take 137 mcg by mouth daily before breakfast.     LORazepam  (ATIVAN ) 0.5 MG tablet One tablet 3 times daily and one daily prn anxiety 120 tablet 2   OLANZapine-Samidorphan (LYBALVI ) 5-10 MG TABS Take 1 tablet by mouth every evening. 30 tablet 5   PARoxetine  (PAXIL ) 30 MG tablet TAKE 1 TABLET BY MOUTH EVERY DAY 90 tablet 0   No current facility-administered medications for this visit.    Medication Side Effects: None  Allergies:  Allergies  Allergen Reactions   Latex Rash    Bandaids    Past Medical History:  Diagnosis Date   Anxiety    Bipolar 1 disorder (HCC)    Depression    Dyspnea on exertion july 2011   Normal stress test dec 2010. Reportedl normal CXR 2011. CT abdomen lung cut 2005  - normal. Normal PFT - march 2012   Hypothyroidism    Obesity (BMI 30-39.9)    BMI 36 Feb 2012   [name] [facility])    Urticaria     Family History  Problem Relation Age of Onset   Alzheimer's disease Mother    Heart attack Father    Coronary  artery disease Brother    Heart attack Brother    Alzheimer's disease Paternal Grandmother    Allergic rhinitis Neg Hx    Angioedema Neg Hx    Asthma Neg Hx    Atopy Neg Hx    Eczema Neg Hx    Immunodeficiency Neg Hx    Urticaria Neg Hx    Colon cancer Neg Hx     Social  History   Socioeconomic History   Marital status: Married    Spouse name: Annette Johnston   Number of children: 1   Years of education: Not on file   Highest education level: High school graduate  Occupational History   Occupation: retired  Tobacco Use   Smoking status: Never   Smokeless tobacco: Never  Vaping Use   Vaping status: Never Used  Substance and Sexual Activity   Alcohol  use: Not Currently   Drug use: No   Sexual activity: Yes    Birth control/protection: Surgical  Other Topics Concern   Not on file  Social History Narrative   Caffeine 1 cup per day    Lives at home with husband    Right Handed.    Social Drivers of Corporate Investment Banker Strain: Not on file  Food Insecurity: Not on file  Transportation Needs: Not on file  Physical Activity: Not on file  Stress: Not on file  Social Connections: Not on file  Intimate Partner Violence: Not At Risk (10/18/2021)   Received from [facility]   Humiliation, Afraid, Rape, and Kick questionnaire    Within the last year, have you been afraid of your partner or ex-partner?: No    Within the last year, have you been humiliated or emotionally abused in other ways by your partner or ex-partner?: No    Within the last year, have you been kicked, hit, slapped, or otherwise physically hurt by your partner or ex-partner?: No    Within the last year, have you been raped or forced to have any kind of sexual activity by your partner or ex-partner?: No    Past Medical History, Surgical history, Social history, and Family history were reviewed and updated as appropriate.   Please see review of systems for further details on the patient's review from today.    Objective:   Physical Exam:  There were no vitals taken for this visit.  Physical Exam Constitutional:      General: She is not in acute distress.    Appearance: She is well-developed.  Musculoskeletal:        General: No deformity.  Neurological:     Mental Status: She is alert and oriented to person, place, and time.     Cranial Nerves: No dysarthria.     Coordination: Coordination normal.     Comments: Minimal mouth movements Mod rhythmic fidgitiness of lower extremeties and arms to lesser degree   Gait is slow DT pain in bottom of feet when walks.  Psychiatric:        Attention and Perception: Attention and perception normal. She is attentive.        Mood and Affect: Mood normal. Mood is not anxious or depressed. Affect is not labile, blunt, angry or inappropriate.        Speech: Speech normal.        Behavior: Behavior normal. Behavior is cooperative.        Thought Content: Thought content normal. Thought content is not paranoid or delusional. Thought content does not include homicidal or suicidal ideation. Thought content does not include homicidal or suicidal plan.        Cognition and Memory: Cognition and memory normal.        Judgment: Judgment normal.     Comments: Insight intact Not irritable     Lab Review:     Component Value Date/Time   NA 129 (L) 11/27/2021 1441   NA 137 12/17/2017 1056  K 4.0 11/27/2021 1441   CL 97 (L) 11/27/2021 1441   CO2 22 11/27/2021 1441   GLUCOSE 97 11/27/2021 1441   BUN 11 11/27/2021 1441   BUN 15 12/17/2017 1056   CREATININE 0.50 11/27/2021 1441   CALCIUM 9.3 11/27/2021 1441   PROT 7.3 12/17/2017 1056   ALBUMIN 4.2 12/17/2017 1056   AST 30 12/17/2017 1056   ALT 26 12/17/2017 1056   ALKPHOS 77 12/17/2017 1056   BILITOT <0.2 12/17/2017 1056   GFRNONAA >60 11/27/2021 1441   GFRAA 104 12/17/2017 1056       Component Value Date/Time   WBC 7.2 11/27/2021 1441   RBC 4.48 11/27/2021 1441   HGB 13.7 11/27/2021 1441    HGB 13.1 12/17/2017 1056   HCT 40.2 11/27/2021 1441   HCT 38.2 12/17/2017 1056   PLT 308 11/27/2021 1441   PLT 329 12/17/2017 1056   MCV 89.7 11/27/2021 1441   MCV 90 12/17/2017 1056   MCH 30.6 11/27/2021 1441   MCHC 34.1 11/27/2021 1441   RDW 11.8 11/27/2021 1441   RDW 14.0 12/17/2017 1056   LYMPHSABS 1.6 12/17/2017 1056   MONOABS 0.5 06/06/2016 0959   EOSABS 0.3 12/17/2017 1056   BASOSABS 0.0 12/17/2017 1056    No results found for: POCLITH, LITHIUM   No results found for: PHENYTOIN, PHENOBARB, VALPROATE, CBMZ   Component 04/06/22 01/10/22 11/27/20 11/24/19 03/02/19 10/04/18  Sodium 129 Low  130 Low  134 Low  135 Low  129 Low   134 Low     .res Assessment: Plan:    Bipolar I disorder with depression (HCC)  Panic disorder with agoraphobia  Generalized anxiety disorder  Tardive dyskinesia  Insomnia due to mental condition  Mild cognitive impairment   Deconditioning is much better than in the past.  Discussed the importance of exercise Overall mood and anxiety is much improved than in the past and now TD controlled  we discussed Annette Johnston is a very complicated and difficult to treat bipolar patient with tardive dyskinesia.  It is complicated by the presence as well of mild cognitive impairment which appears stable..  In addition complicated by multiple med failures and very limited options for mood stabilization.    Not manic.  Mood is stable and she looks significantly less anxious and worried and distressed on the Lybalvi  since starting that and stopping the oxcarbazepine  oxcarbazepine  was stopped due to hyponatremia.    She is afraid of stopping paroxetine  because she is found it helpful for her anxiety She reports last check was normal but she is not checking it often enough.  now stable on  Lybalvi  5 mg as alternative mood stabilizer which should also be much better with anxiety.   No sig options other than atypical.  Will use low dose in hopes of not  worsening movement disorder. Cannot use a lower dose.    Her tardive dyskinesia symptoms are  markedly  improved on 18 mg Austedo  BID. There are no worsening movement disorder problems since starting low-dose Lybalvi  5 mg.  She still reports some tremor and fidgety but not uncomfortable. Continue Austedo  to 18 mg BID bc sig residual TD. She tolerated it and it helped tongue facial TD. Disc risk increased EPS.  Brain scan and neurology [place] DX PD Jan 2023 and restarted Sinemet   Her TD sx were very difficult to control.  facial sx resolved.   Fidgety is better.  Consider switch Lybalvi  to Saphris to see if fidgetiness is resolved.  lorazepam  0.5 mg reduced to just HS and prn anxiety continue paroxetine  30 mg daily for anxiety .  It is better now.  We discussed the short-term risks associated with benzodiazepines including sedation and increased fall risk among others.  Discussed long-term side effect risk including dependence, potential withdrawal symptoms, and the potential eventual dose-related risk of dementia.  But recent studies from 2020 dispute this association between benzodiazepines and dementia risk. Newer studies in 2020 do not support an association with dementia.  Bruxism stopped.  Plan: no med changes, keep BZ low if possible. Austedo  18 mg twice daily, lorazepam  0.5 mg twice daily and 1 mg at bedtime, Lybalvi  5 nightly, paroxetine  30 daily.   FU 79-month.    Lorene Macintosh, MD, DFAPA  Please see After Visit Summary for patient specific instructions.  No future appointments.      No orders of the defined types were placed in this encounter.      --------------------------

## 2024-07-29 ENCOUNTER — Other Ambulatory Visit: Payer: Self-pay | Admitting: Psychiatry

## 2024-07-29 DIAGNOSIS — F5105 Insomnia due to other mental disorder: Secondary | ICD-10-CM

## 2024-08-03 NOTE — Telephone Encounter (Signed)
 Pended

## 2024-08-03 NOTE — Telephone Encounter (Signed)
 Patient called back stating that she is out of medication and needs refill sent to pharmacy. PH: [phone] appt 5/4 Pharmacy [facility] [address] [place], [place]

## 2024-08-14 ENCOUNTER — Other Ambulatory Visit: Payer: Self-pay | Admitting: Psychiatry

## 2024-08-14 DIAGNOSIS — F411 Generalized anxiety disorder: Secondary | ICD-10-CM

## 2024-08-14 DIAGNOSIS — F4001 Agoraphobia with panic disorder: Secondary | ICD-10-CM

## 2024-08-14 DIAGNOSIS — F319 Bipolar disorder, unspecified: Secondary | ICD-10-CM

## 2024-08-16 ENCOUNTER — Other Ambulatory Visit: Payer: Self-pay | Admitting: Gastroenterology

## 2024-08-16 DIAGNOSIS — K580 Irritable bowel syndrome with diarrhea: Secondary | ICD-10-CM

## 2024-10-19 ENCOUNTER — Ambulatory Visit: Admitting: Gastroenterology

## 2024-12-14 ENCOUNTER — Ambulatory Visit: Admitting: Psychiatry
